# Patient Record
Sex: Female | Born: 1965 | Race: White | Hispanic: No | Marital: Married | Smoking: Never smoker
Health system: Southern US, Community
[De-identification: ages and names within clinical notes are randomized; demographics above are authoritative.]

## PROBLEM LIST (undated history)

## (undated) DIAGNOSIS — F32A Depression, unspecified: Secondary | ICD-10-CM

## (undated) DIAGNOSIS — I10 Essential (primary) hypertension: Secondary | ICD-10-CM

## (undated) DIAGNOSIS — F329 Major depressive disorder, single episode, unspecified: Secondary | ICD-10-CM

## (undated) DIAGNOSIS — E119 Type 2 diabetes mellitus without complications: Secondary | ICD-10-CM

## (undated) DIAGNOSIS — B019 Varicella without complication: Secondary | ICD-10-CM

## (undated) DIAGNOSIS — T7840XA Allergy, unspecified, initial encounter: Secondary | ICD-10-CM

## (undated) HISTORY — DX: Varicella without complication: B01.9

## (undated) HISTORY — DX: Depression, unspecified: F32.A

## (undated) HISTORY — PX: TUBAL LIGATION: SHX77

## (undated) HISTORY — DX: Allergy, unspecified, initial encounter: T78.40XA

## (undated) HISTORY — DX: Major depressive disorder, single episode, unspecified: F32.9

---

## 2006-06-08 ENCOUNTER — Ambulatory Visit: Payer: Self-pay | Admitting: Obstetrics and Gynecology

## 2011-06-14 ENCOUNTER — Ambulatory Visit: Payer: Self-pay | Admitting: Obstetrics and Gynecology

## 2011-12-28 ENCOUNTER — Ambulatory Visit: Payer: BC Managed Care – PPO

## 2011-12-28 ENCOUNTER — Ambulatory Visit (INDEPENDENT_AMBULATORY_CARE_PROVIDER_SITE_OTHER): Payer: BC Managed Care – PPO | Admitting: Internal Medicine

## 2011-12-28 VITALS — BP 140/80 | HR 80 | Temp 98.6°F | Resp 18 | Ht 64.0 in | Wt 189.0 lb

## 2011-12-28 DIAGNOSIS — M545 Low back pain: Secondary | ICD-10-CM

## 2011-12-28 DIAGNOSIS — T148XXA Other injury of unspecified body region, initial encounter: Secondary | ICD-10-CM

## 2011-12-28 MED ORDER — HYDROCODONE-ACETAMINOPHEN 5-325 MG PO TABS: 1.0000 | ORAL_TABLET | Freq: Four times a day (QID) | ORAL | Status: DC | PRN

## 2011-12-28 MED ORDER — CYCLOBENZAPRINE HCL 10 MG PO TABS: 10.0000 mg | ORAL_TABLET | Freq: Three times a day (TID) | ORAL | Status: DC | PRN

## 2011-12-28 MED ORDER — PREDNISONE 50 MG PO TABS: ORAL_TABLET | ORAL | Status: DC

## 2011-12-28 MED ORDER — KETOROLAC TROMETHAMINE 60 MG/2ML IM SOLN
60.0000 mg | Freq: Once | INTRAMUSCULAR | Status: AC
  Administered 2011-12-28: 60 mg via INTRAMUSCULAR

## 2011-12-28 NOTE — Patient Instructions (Signed)
vicdoin 1 tab every 4 hours as needed for pain. Flexeril 1 tab every 8 hours as needed for spasm. prednisone 1 tab daily for 3 days in am with food. If your symptoms worsen return to the office.

## 2011-12-28 NOTE — Progress Notes (Signed)
  Subjective:    Patient ID: Breanna Day, female    DOB: 1965-04-12, 46 y.o.   MRN: [id number]  HPI    Review of Systems     Objective:   Physical Exam    [facility] reading (PRIMARY) by  Dr. Mindi Junker lumbar spine negitive.     Assessment & Plan:

## 2011-12-28 NOTE — Progress Notes (Signed)
  Subjective:    Patient ID: Breanna Day, female    DOB: [birth year], 46 y.o.   MRN: [id number]  HPI  Bac pain Onset this am bending over Pain 8/10 No radiation down leg but is increased on the left side and in the middle No numbness no tingling no difficulty with bowel or bladder function  Review of Systems  Constitutional: Negative.   HENT: Negative.   Eyes: Negative.   Respiratory: Negative.   Cardiovascular: Negative.   Gastrointestinal: Negative.   Genitourinary: Negative.   Musculoskeletal: Positive for back pain.  Skin: Negative.   Neurological: Negative.   Hematological: Negative.   Psychiatric/Behavioral: Negative.   All other systems reviewed and are negative.       Objective:   Physical Exam  Constitutional: She is oriented to person, place, and time. She appears well-developed and well-nourished.  HENT:  Head: Normocephalic.  Nose: Nose normal.  Mouth/Throat: Oropharynx is clear and moist.  Eyes: Conjunctivae normal and EOM are normal. Pupils are equal, round, and reactive to light.  Neck: Neck supple.  Cardiovascular: Normal rate, regular rhythm and normal heart sounds.   Pulmonary/Chest: Effort normal and breath sounds normal.  Abdominal: Soft. Bowel sounds are normal.  Musculoskeletal: She exhibits tenderness.       tender lumbar spine pain increased on the left of l3/ l5 region with muscle spasm of the area  Neurological: She is alert and oriented to person, place, and time. She has normal reflexes.  Skin: Skin is warm and dry.  Psychiatric: She has a normal mood and affect. Her behavior is normal. Judgment and thought content normal.     '[facility] reading (PRIMARY) by  Dr. Mindi Junker no fracture of dislocation. toradol im in the office for pain    Assessment & Plan:  Lumbago  Muscle strain  plan xray and also toradol im for pain in the office. D/c with antiinflammatory analgesics nd antispasmodics.

## 2012-06-27 ENCOUNTER — Ambulatory Visit: Payer: Self-pay | Admitting: Obstetrics and Gynecology

## 2013-07-04 ENCOUNTER — Ambulatory Visit: Payer: Self-pay | Admitting: Obstetrics and Gynecology

## 2013-07-04 LAB — HM MAMMOGRAPHY: HM Mammogram: NORMAL

## 2014-06-24 ENCOUNTER — Encounter: Payer: Self-pay | Admitting: Internal Medicine

## 2014-06-24 ENCOUNTER — Encounter (INDEPENDENT_AMBULATORY_CARE_PROVIDER_SITE_OTHER): Payer: Self-pay

## 2014-06-24 ENCOUNTER — Ambulatory Visit (INDEPENDENT_AMBULATORY_CARE_PROVIDER_SITE_OTHER): Payer: BLUE CROSS/BLUE SHIELD | Admitting: Internal Medicine

## 2014-06-24 VITALS — BP 130/86 | HR 95 | Temp 98.4°F | Ht 62.5 in | Wt 206.0 lb

## 2014-06-24 DIAGNOSIS — K219 Gastro-esophageal reflux disease without esophagitis: Secondary | ICD-10-CM | POA: Diagnosis not present

## 2014-06-24 DIAGNOSIS — J302 Other seasonal allergic rhinitis: Secondary | ICD-10-CM

## 2014-06-24 DIAGNOSIS — F32A Depression, unspecified: Secondary | ICD-10-CM

## 2014-06-24 DIAGNOSIS — F411 Generalized anxiety disorder: Secondary | ICD-10-CM | POA: Insufficient documentation

## 2014-06-24 DIAGNOSIS — L509 Urticaria, unspecified: Secondary | ICD-10-CM | POA: Insufficient documentation

## 2014-06-24 DIAGNOSIS — F329 Major depressive disorder, single episode, unspecified: Secondary | ICD-10-CM

## 2014-06-24 DIAGNOSIS — F419 Anxiety disorder, unspecified: Secondary | ICD-10-CM | POA: Insufficient documentation

## 2014-06-24 DIAGNOSIS — E669 Obesity, unspecified: Secondary | ICD-10-CM | POA: Insufficient documentation

## 2014-06-24 MED ORDER — HYDROXYZINE PAMOATE 25 MG PO CAPS: 25.0000 mg | ORAL_CAPSULE | Freq: Four times a day (QID) | ORAL | Status: DC | PRN

## 2014-06-24 MED ORDER — PANTOPRAZOLE SODIUM 40 MG PO TBEC: 40.0000 mg | DELAYED_RELEASE_TABLET | Freq: Every day | ORAL | Status: DC

## 2014-06-24 NOTE — Progress Notes (Signed)
Pre visit review using our clinic review tool, if applicable. No additional management support is needed unless otherwise documented below in the visit note. 

## 2014-06-24 NOTE — Progress Notes (Signed)
HPI  Pt presents to the clinic today to establish care and for management of the conditions listed below. She is transferring care from Dr. Beckey Downing at [facility] in [place].  Seasonal Allergies: she will take Claritin or Zyrtec if it gets really bad. Worse in the spring.  Depression: History of but not recurrent. Was treated for this 15 years ago. She denies anxiety, SI/HI.  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 She takes Hydroxyzine twice daily.  GERD: She denies breakthrough symptoms on Protonix.  Flu: 2014 Tetanus: she thinks < 10 years LMP: 06/20/14, ver yirregular Pap Smear: 07/2013 Mammogram: 07/2013, she will schedule 07/2014 Vision Screening: > 1 years ago Dentist: biannually  Past Medical History  Diagnosis Date  . Chicken pox   . Allergy   . Depression     Current Outpatient Prescriptions  Medication Sig Dispense Refill  . hydrOXYzine (VISTARIL) 25 MG capsule Take 25 mg by mouth every 6 (six) hours as needed.    . pantoprazole (PROTONIX) 40 MG tablet Take 40 mg by mouth daily.     No current facility-administered medications for this visit.    No Known Allergies  Family History  Problem Relation Age of Onset  . Hypertension Mother   . Arthritis Mother   . Hyperlipidemia Mother   . Hypertension Father   . Arthritis Father   . Hyperlipidemia Father   . Diabetes Maternal Grandmother   . Heart disease Maternal Grandmother   . Arthritis Maternal Grandmother   . Hypertension Maternal Grandmother   . Cancer Maternal Grandfather   . Heart disease Maternal Grandfather   . Arthritis Maternal Grandfather   . Hypertension Maternal Grandfather   . Diabetes Paternal Grandmother   . Cancer Paternal Grandmother   . Arthritis Paternal Grandmother   . Hypertension Paternal Grandmother   . Diabetes Paternal Grandfather   . Heart disease Paternal Grandfather   . Arthritis Paternal Grandfather   . Hypertension Paternal Grandfather   .  Cancer Maternal Uncle     Colon  . Cancer Paternal Aunt     breast    History   Social History  . Marital Status: Married    Spouse Name: N/A  . Number of Children: N/A  . Years of Education: N/A   Occupational History  . Not on file.   Social History Main Topics  . Smoking status: Never Smoker   . Smokeless tobacco: Never Used  . Alcohol Use: No  . Drug Use: No  . Sexual Activity: Not on file   Other Topics Concern  . Not on file   Social History Narrative    ROS:  Constitutional: Denies fever, malaise, fatigue, headache or abrupt weight changes.  Respiratory: Denies difficulty breathing, shortness of breath, cough or sputum production.   Cardiovascular: Denies chest pain, chest tightness, palpitations or swelling in the hands or feet.  Gastrointestinal: Denies abdominal pain, bloating, constipation, diarrhea or blood in the stool.  Skin: Denies redness, rashes, lesions or ulcercations.  Neurological: Denies dizziness, difficulty with memory, difficulty with speech or problems with balance and coordination.  Psych: Denies depression, anxiety, SI/HI.  No other specific complaints in a complete review of systems (except as listed in HPI above).  PE:  BP 130/86 mmHg  Pulse 95  Temp(Src) 98.4 F (36.9 C) (Oral)  Ht 5' 2.5" (1.588 m)  Wt 206 lb (93.441 kg)  BMI 37.05 kg/m2  SpO2 98%  LMP 06/20/2014  Wt Readings from Last 3  Encounters:  06/24/14 206 lb (93.441 kg)  12/28/11 189 lb (85.73 kg)    General: Appears her stated age, obese in NAD. HEENT: Eyes normal. Nose pink and moist. Throat pink and moist, no exudate or lesion noted. Skin: Warm, dry and intact Cardiovascular: Normal rate and rhythm. S1,S2 noted.  No murmur, rubs or gallops noted.  Pulmonary/Chest: Normal effort and positive vesicular breath sounds. No respiratory distress. No wheezes, rales or ronchi noted.  Abdomen: Soft and nontender. Normal bowel sounds, no bruits noted. No distention or  masses noted. Liver, spleen and kidneys non palpable. Neurological: Alert and oriented. Marland Kitchen. Psychiatric: Mood and affect normal. Behavior is normal. Judgment and thought content normal.     Assessment and Plan:

## 2014-06-24 NOTE — Assessment & Plan Note (Signed)
Encouraged her to work on diet and exercise 

## 2014-06-24 NOTE — Assessment & Plan Note (Signed)
Idiopathic  Continue Hydroxyzine twice daily

## 2014-06-24 NOTE — Assessment & Plan Note (Signed)
No reoccurance Will continue to monitor 

## 2014-06-24 NOTE — Assessment & Plan Note (Signed)
Continue OTC antihistamine prn

## 2014-06-24 NOTE — Assessment & Plan Note (Signed)
Continue Protonix daily Handout given on diet information for GERD

## 2014-06-24 NOTE — Patient Instructions (Signed)

## 2014-07-16 ENCOUNTER — Ambulatory Visit (INDEPENDENT_AMBULATORY_CARE_PROVIDER_SITE_OTHER): Payer: BLUE CROSS/BLUE SHIELD | Admitting: Internal Medicine

## 2014-07-16 ENCOUNTER — Encounter: Payer: Self-pay | Admitting: Internal Medicine

## 2014-07-16 VITALS — BP 126/78 | HR 87 | Temp 98.7°F | Wt 205.5 lb

## 2014-07-16 DIAGNOSIS — J0100 Acute maxillary sinusitis, unspecified: Secondary | ICD-10-CM

## 2014-07-16 MED ORDER — AMOXICILLIN-POT CLAVULANATE 875-125 MG PO TABS: 1.0000 | ORAL_TABLET | Freq: Two times a day (BID) | ORAL | Status: DC

## 2014-07-16 MED ORDER — HYDROXYZINE HCL 25 MG PO TABS: 25.0000 mg | ORAL_TABLET | Freq: Two times a day (BID) | ORAL | Status: DC

## 2014-07-16 NOTE — Progress Notes (Signed)
HPI  Pt presents to the clinic today with c/o nasal congestion, post nasal drip and cough. This started 2-3 weeks ago. The cough is productive of green mucous. She is blowing green mucous out of her nose. She denies fever, chills or body aches. She has taken Mucinex and cough syrup with some relief. She has had allergies intermittently in the past. She takes Claritin OTC some times. She has had sick contacts. She does not smoke.  Review of Systems      Past Medical History  Diagnosis Date  . Chicken pox   . Allergy   . Depression     Family History  Problem Relation Age of Onset  . Hypertension Mother   . Arthritis Mother   . Hyperlipidemia Mother   . Hypertension Father   . Arthritis Father   . Hyperlipidemia Father   . Diabetes Maternal Grandmother   . Heart disease Maternal Grandmother   . Arthritis Maternal Grandmother   . Hypertension Maternal Grandmother   . Cancer Maternal Grandfather   . Heart disease Maternal Grandfather   . Arthritis Maternal Grandfather   . Hypertension Maternal Grandfather   . Diabetes Paternal Grandmother   . Cancer Paternal Grandmother   . Arthritis Paternal Grandmother   . Hypertension Paternal Grandmother   . Diabetes Paternal Grandfather   . Heart disease Paternal Grandfather   . Arthritis Paternal Grandfather   . Hypertension Paternal Grandfather   . Cancer Maternal Uncle     Colon  . Cancer Paternal Aunt     breast    History   Social History  . Marital Status: Married    Spouse Name: N/A  . Number of Children: N/A  . Years of Education: N/A   Occupational History  . Not on file.   Social History Main Topics  . Smoking status: Never Smoker   . Smokeless tobacco: Never Used  . Alcohol Use: No  . Drug Use: No  . Sexual Activity: Yes   Other Topics Concern  . Not on file   Social History Narrative    No Known Allergies   Constitutional: Positive headache, fatigue. Denies fever or abrupt weight changes.  HEENT:   Positive facial pressure, nasal congestion and sore throat. Denies eye redness, eye pain, pressure behind the eyes, facial pain, nasal congestion, ear pain, ringing in the ears, wax buildup, runny nose or bloody nose. Respiratory: Positive cough. Denies difficulty breathing or shortness of breath.  Cardiovascular: Denies chest pain, chest tightness, palpitations or swelling in the hands or feet.   No other specific complaints in a complete review of systems (except as listed in HPI above).  Objective:   BP 126/78 mmHg  Pulse 87  Temp(Src) 98.7 F (37.1 C) (Oral)  Wt 205 lb 8 oz (93.214 kg)  SpO2 98%  LMP 06/20/2014 Wt Readings from Last 3 Encounters:  07/16/14 205 lb 8 oz (93.214 kg)  06/24/14 206 lb (93.441 kg)  12/28/11 189 lb (85.73 kg)     General: Appears her stated age, well developed, well nourished in NAD. HEENT: Head: normal shape and size, maxillary sinus tenderness noted; Eyes: sclera white, no icterus, conjunctiva pink; Ears: Tm's gray and intact, normal light reflex; Nose: mucosa boggy and moist, septum midline; Throat/Mouth: + PND. Teeth present, mucosa erythematous and moist, no exudate noted, no lesions or ulcerations noted.  Neck: No cervical lymphadenopathy.  Cardiovascular: Normal rate and rhythm. S1,S2 noted.  No murmur, rubs or gallops noted.  Pulmonary/Chest: Normal effort and  positive vesicular breath sounds. No respiratory distress. No wheezes, rales or ronchi noted.      Assessment & Plan:   Acute Maxillary Sinusitis:  Get some rest and drink plenty of water Can use a Neti Pot  Continue Claritin eRx for Augmentin BID x 10 days Ibuprofen as needed for facial pain and pressure  RTC as needed or if symptoms persist or worsen

## 2014-07-16 NOTE — Patient Instructions (Signed)

## 2014-08-22 ENCOUNTER — Telehealth: Payer: Self-pay | Admitting: Internal Medicine

## 2014-08-22 NOTE — Telephone Encounter (Signed)
Medical records received from [facility], no immunizations on file. Mammogram will be abstracted and scanned

## 2014-08-29 ENCOUNTER — Encounter: Payer: Self-pay | Admitting: Internal Medicine

## 2014-10-23 ENCOUNTER — Other Ambulatory Visit: Payer: Self-pay | Admitting: Obstetrics and Gynecology

## 2014-10-23 DIAGNOSIS — Z1231 Encounter for screening mammogram for malignant neoplasm of breast: Secondary | ICD-10-CM

## 2014-10-23 LAB — HM PAP SMEAR: HM PAP: NEGATIVE

## 2014-10-23 LAB — FECAL OCCULT BLOOD, GUAIAC: FECAL OCCULT BLD: NEGATIVE

## 2014-10-28 ENCOUNTER — Ambulatory Visit
Admission: RE | Admit: 2014-10-28 | Discharge: 2014-10-28 | Disposition: A | Discharge status: Home / Self Care | Payer: BLUE CROSS/BLUE SHIELD | Source: Ambulatory Visit | Attending: Obstetrics and Gynecology | Admitting: Obstetrics and Gynecology

## 2014-10-28 DIAGNOSIS — Z1231 Encounter for screening mammogram for malignant neoplasm of breast: Secondary | ICD-10-CM | POA: Diagnosis present

## 2015-01-02 ENCOUNTER — Other Ambulatory Visit: Payer: Self-pay

## 2015-01-02 DIAGNOSIS — K219 Gastro-esophageal reflux disease without esophagitis: Secondary | ICD-10-CM

## 2015-01-02 MED ORDER — PANTOPRAZOLE SODIUM 40 MG PO TBEC: 40.0000 mg | DELAYED_RELEASE_TABLET | Freq: Every day | ORAL | Status: DC

## 2015-02-25 ENCOUNTER — Other Ambulatory Visit: Payer: Self-pay | Admitting: Internal Medicine

## 2015-03-24 ENCOUNTER — Other Ambulatory Visit: Payer: Self-pay | Admitting: Internal Medicine

## 2015-04-20 ENCOUNTER — Other Ambulatory Visit: Payer: Self-pay | Admitting: Internal Medicine

## 2015-05-15 ENCOUNTER — Encounter: Payer: Self-pay | Admitting: Internal Medicine

## 2015-05-15 ENCOUNTER — Ambulatory Visit (INDEPENDENT_AMBULATORY_CARE_PROVIDER_SITE_OTHER): Payer: BC Managed Care – PPO | Admitting: Internal Medicine

## 2015-05-15 VITALS — BP 120/82 | HR 88 | Temp 98.1°F | Wt 201.0 lb

## 2015-05-15 DIAGNOSIS — K219 Gastro-esophageal reflux disease without esophagitis: Secondary | ICD-10-CM | POA: Diagnosis not present

## 2015-05-15 DIAGNOSIS — F329 Major depressive disorder, single episode, unspecified: Secondary | ICD-10-CM

## 2015-05-15 DIAGNOSIS — J302 Other seasonal allergic rhinitis: Secondary | ICD-10-CM | POA: Diagnosis not present

## 2015-05-15 DIAGNOSIS — F32A Depression, unspecified: Secondary | ICD-10-CM

## 2015-05-15 DIAGNOSIS — L509 Urticaria, unspecified: Secondary | ICD-10-CM

## 2015-05-15 DIAGNOSIS — E669 Obesity, unspecified: Secondary | ICD-10-CM

## 2015-05-15 MED ORDER — HYDROXYZINE HCL 25 MG PO TABS: 25.0000 mg | ORAL_TABLET | Freq: Two times a day (BID) | ORAL | Status: DC

## 2015-05-15 MED ORDER — PANTOPRAZOLE SODIUM 40 MG PO TBEC: 40.0000 mg | DELAYED_RELEASE_TABLET | Freq: Two times a day (BID) | ORAL | Status: DC

## 2015-05-15 NOTE — Progress Notes (Signed)
HPI  Pt presents to the clinic today for follow up of chronic conditions.  Seasonal Allergies: Worse in the spring. She will take Claritin or Zyrtec if it gets really bad.   Depression: History of but not recurrent. Was treated for this 15 years ago. She denies anxiety, SI/HI.  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 She takes Hydroxyzine twice daily.  GERD: She reports she ran out of her Protonix about 3 weeks ago. Her reflux has been really bad since that time. She has been taking Tums or Maalox but reports it does not touch it.    Past Medical History  Diagnosis Date  . Chicken pox   . Allergy   . Depression     Current Outpatient Prescriptions  Medication Sig Dispense Refill  . hydrOXYzine (ATARAX/VISTARIL) 25 MG tablet Take 1 tablet (25 mg total) by mouth 2 (two) times daily. MUST SCHEDULE ANNUAL PHYSICAL [phone] 60 tablet 1  . pantoprazole (PROTONIX) 40 MG tablet Take 1 tablet (40 mg total) by mouth daily. MUST SCHEDULE ANNUAL PHYSICAL FOR MORE REFILLS 30 tablet 1   No current facility-administered medications for this visit.    No Known Allergies  Family History  Problem Relation Age of Onset  . Hypertension Mother   . Arthritis Mother   . Hyperlipidemia Mother   . Hypertension Father   . Arthritis Father   . Hyperlipidemia Father   . Diabetes Maternal Grandmother   . Heart disease Maternal Grandmother   . Arthritis Maternal Grandmother   . Hypertension Maternal Grandmother   . Cancer Maternal Grandfather   . Heart disease Maternal Grandfather   . Arthritis Maternal Grandfather   . Hypertension Maternal Grandfather   . Diabetes Paternal Grandmother   . Cancer Paternal Grandmother   . Arthritis Paternal Grandmother   . Hypertension Paternal Grandmother   . Diabetes Paternal Grandfather   . Heart disease Paternal Grandfather   . Arthritis Paternal Grandfather   . Hypertension Paternal Grandfather   . Cancer Maternal Uncle      Colon  . Breast cancer Paternal Aunt 38    Social History   Social History  . Marital Status: Married    Spouse Name: N/A  . Number of Children: N/A  . Years of Education: N/A   Occupational History  . Not on file.   Social History Main Topics  . Smoking status: Never Smoker   . Smokeless tobacco: Never Used  . Alcohol Use: No  . Drug Use: No  . Sexual Activity: Yes   Other Topics Concern  . Not on file   Social History Narrative    ROS:  Constitutional: Denies fever, malaise, fatigue, headache or abrupt weight changes.  Respiratory: Denies difficulty breathing, shortness of breath, cough or sputum production.   Cardiovascular: Denies chest pain, chest tightness, palpitations or swelling in the hands or feet.  Gastrointestinal: Denies abdominal pain, bloating, constipation, diarrhea or blood in the stool.  Skin: Denies redness, rashes, lesions or ulcercations.  Neurological: Denies dizziness, difficulty with memory, difficulty with speech or problems with balance and coordination.  Psych: Denies depression, anxiety, SI/HI.  No other specific complaints in a complete review of systems (except as listed in HPI above).  PE:  BP 120/82 mmHg  Pulse 88  Temp(Src) 98.1 F (36.7 C) (Oral)  Wt 201 lb (91.173 kg)  SpO2 98%   Wt Readings from Last 3 Encounters:  05/15/15 201 lb (91.173 kg)  07/16/14 205 lb 8 oz (  93.214 kg)  06/24/14 206 lb (93.441 kg)    General: Appears her stated age, obese in NAD. HEENT: Eyes normal. Nose pink and moist. Throat pink and moist, no exudate or lesion noted. Skin: Warm, dry and intact Cardiovascular: Normal rate and rhythm. S1,S2 noted.  No murmur, rubs or gallops noted.  Pulmonary/Chest: Normal effort and positive vesicular breath sounds. No respiratory distress. No wheezes, rales or ronchi noted.  Abdomen: Soft and nontender. Normal bowel sounds. No distention or masses noted. Liver, spleen and kidneys non  palpable. Neurological: Alert and oriented. Marland Kitchen. Psychiatric: Mood and affect normal. Behavior is normal. Judgment and thought content normal.     Assessment and Plan:

## 2015-05-15 NOTE — Progress Notes (Signed)
Pre visit review using our clinic review tool, if applicable. No additional management support is needed unless otherwise documented below in the visit note. 

## 2015-05-16 ENCOUNTER — Encounter: Payer: Self-pay | Admitting: Internal Medicine

## 2015-05-16 NOTE — Assessment & Plan Note (Signed)
Continue Claritin or Zyrtec as needed

## 2015-05-16 NOTE — Assessment & Plan Note (Signed)
Restart Protonix 40 mg BID x 2 weeks then try to go back to daily Discussed how weight loss could help improve her reflux

## 2015-05-16 NOTE — Assessment & Plan Note (Signed)
Encouraged her to work on diet and exercise 

## 2015-05-16 NOTE — Assessment & Plan Note (Signed)
Continue Hydroxyzine Refilled today

## 2015-05-16 NOTE — Assessment & Plan Note (Signed)
Stable off meds Will continue to monitor 

## 2015-05-16 NOTE — Patient Instructions (Signed)
Food Choices for Gastroesophageal Reflux Disease, Adult When you have gastroesophageal reflux disease (GERD), the foods you eat and your eating habits are very important. Choosing the right foods can help ease the discomfort of GERD. WHAT GENERAL GUIDELINES DO I NEED TO FOLLOW?  Choose fruits, vegetables, whole grains, low-fat dairy products, and low-fat meat, fish, and poultry.  Limit fats such as oils, salad dressings, butter, nuts, and avocado.  Keep a food diary to identify foods that cause symptoms.  Avoid foods that cause reflux. These may be different for different people.  Eat frequent small meals instead of three large meals each day.  Eat your meals slowly, in a relaxed setting.  Limit fried foods.  Cook foods using methods other than frying.  Avoid drinking alcohol.  Avoid drinking large amounts of liquids with your meals.  Avoid bending over or lying down until 2-3 hours after eating. WHAT FOODS ARE NOT RECOMMENDED? The following are some foods and drinks that may worsen your symptoms: Vegetables Tomatoes. Tomato juice. Tomato and spaghetti sauce. Chili peppers. Onion and garlic. Horseradish. Fruits Oranges, grapefruit, and lemon (fruit and juice). Meats High-fat meats, fish, and poultry. This includes hot dogs, ribs, ham, sausage, salami, and bacon. Dairy Whole milk and chocolate milk. Sour cream. Cream. Butter. Ice cream. Cream cheese.  Beverages Coffee and tea, with or without caffeine. Carbonated beverages or energy drinks. Condiments Hot sauce. Barbecue sauce.  Sweets/Desserts Chocolate and cocoa. Donuts. Peppermint and spearmint. Fats and Oils High-fat foods, including French fries and potato chips. Other Vinegar. Strong spices, such as black pepper, white pepper, red pepper, cayenne, curry powder, cloves, ginger, and chili powder. The items listed above may not be a complete list of foods and beverages to avoid. Contact your dietitian for more  information.   This information is not intended to replace advice given to you by your health care provider. Make sure you discuss any questions you have with your health care provider.   Document Released: 02/28/2005 Document Revised: 03/21/2014 Document Reviewed: 01/02/2013 Elsevier Interactive Patient Education 2016 [employer]  

## 2015-05-21 ENCOUNTER — Encounter: Payer: Self-pay | Admitting: Internal Medicine

## 2015-05-21 ENCOUNTER — Ambulatory Visit (INDEPENDENT_AMBULATORY_CARE_PROVIDER_SITE_OTHER): Payer: BC Managed Care – PPO | Admitting: Internal Medicine

## 2015-05-21 VITALS — BP 122/80 | HR 71 | Temp 98.1°F | Ht 62.5 in | Wt 199.5 lb

## 2015-05-21 DIAGNOSIS — Z0000 Encounter for general adult medical examination without abnormal findings: Secondary | ICD-10-CM | POA: Diagnosis not present

## 2015-05-21 NOTE — Progress Notes (Signed)
Subjective:    Patient ID: Breanna Day, female    DOB: 01/30/1966, 50 y.o.   MRN: [id number]  HPI  Pt presents to the clinic today for her annual exam.  Flu: 12/2013 Tetanus: < 10 years ago Pap Smear: 10/2014 Mammogram: 10/2014 Vision Screening: as needed Dentist: biannually  Diet: She does consume meat. She eats more veggies than fruit. She tries to avoid fried food. She drinks mostly water. Exercise: She walks for about 45-50 minutes daily.  Review of Systems      Past Medical History  Diagnosis Date  . Chicken pox   . Allergy   . Depression     Current Outpatient Prescriptions  Medication Sig Dispense Refill  . hydrOXYzine (ATARAX/VISTARIL) 25 MG tablet Take 1 tablet (25 mg total) by mouth 2 (two) times daily. 60 tablet 5  . pantoprazole (PROTONIX) 40 MG tablet Take 1 tablet (40 mg total) by mouth 2 (two) times daily before a meal. 60 tablet 5   No current facility-administered medications for this visit.    No Known Allergies  Family History  Problem Relation Age of Onset  . Hypertension Mother   . Arthritis Mother   . Hyperlipidemia Mother   . Hypertension Father   . Arthritis Father   . Hyperlipidemia Father   . Diabetes Maternal Grandmother   . Heart disease Maternal Grandmother   . Arthritis Maternal Grandmother   . Hypertension Maternal Grandmother   . Cancer Maternal Grandfather   . Heart disease Maternal Grandfather   . Arthritis Maternal Grandfather   . Hypertension Maternal Grandfather   . Diabetes Paternal Grandmother   . Cancer Paternal Grandmother   . Arthritis Paternal Grandmother   . Hypertension Paternal Grandmother   . Diabetes Paternal Grandfather   . Heart disease Paternal Grandfather   . Arthritis Paternal Grandfather   . Hypertension Paternal Grandfather   . Cancer Maternal Uncle     Colon  . Breast cancer Paternal Aunt [id number]    Social History   Social History  . Marital Status: Married    Spouse Name: N/A  . Number of  Children: N/A  . Years of Education: N/A   Occupational History  . Not on file.   Social History Main Topics  . Smoking status: Never Smoker   . Smokeless tobacco: Never Used  . Alcohol Use: No  . Drug Use: No  . Sexual Activity: Yes   Other Topics Concern  . Not on file   Social History Narrative     Constitutional: Denies fever, malaise, fatigue, headache or abrupt weight changes.  HEENT: Denies eye pain, eye redness, ear pain, ringing in the ears, wax buildup, runny nose, nasal congestion, bloody nose, or sore throat. Respiratory: Denies difficulty breathing, shortness of breath, cough or sputum production.   Cardiovascular: Denies chest pain, chest tightness, palpitations or swelling in the hands or feet.  Gastrointestinal: Denies abdominal pain, bloating, constipation, diarrhea or blood in the stool.  GU: Denies urgency, frequency, pain with urination, burning sensation, blood in urine, odor or discharge. Musculoskeletal: Denies decrease in range of motion, difficulty with gait, muscle pain or joint pain and swelling.  Skin: Denies redness, rashes, lesions or ulcercations.  Neurological: Denies dizziness, difficulty with memory, difficulty with speech or problems with balance and coordination.  Psych: Denies anxiety, depression, SI/HI.  No other specific complaints in a complete review of systems (except as listed in HPI above).  Objective:   Physical Exam  BP 122/80 mmHg  Pulse 71  Temp(Src) 98.1 F (36.7 C) (Oral)  Ht 5' 2.5" (1.588 m)  Wt 199 lb 8 oz (90.493 kg)  BMI 35.89 kg/m2  SpO2 98%  LMP 05/16/2015 Wt Readings from Last 3 Encounters:  05/21/15 199 lb 8 oz (90.493 kg)  05/15/15 201 lb (91.173 kg)  07/16/14 205 lb 8 oz (93.214 kg)    General: Appears her stated age, obese in NAD. Skin: Warm, dry and intact. No rashes, lesions or ulcerations noted. HEENT: Head: normal shape and size; Eyes: sclera white, no icterus, conjunctiva pink, PERRLA and EOMs  intact; Ears: Tm's gray and intact, normal light reflex; Throat/Mouth: Teeth present, mucosa pink and moist, no exudate, lesions or ulcerations noted.  Neck:  Neck supple, trachea midline. No masses, lumps or thyromegaly present.  Cardiovascular: Normal rate and rhythm. S1,S2 noted.  No murmur, rubs or gallops noted. No JVD or BLE edema. No carotid bruits noted. Pulmonary/Chest: Normal effort and positive vesicular breath sounds. No respiratory distress. No wheezes, rales or ronchi noted.  Abdomen: Soft and nontender. Normal bowel sounds. No distention or masses noted. Liver, spleen and kidneys non palpable. Musculoskeletal: Strength 5/5 BUE/BLE. No signs of joint swelling. No difficulty with gait.  Neurological: Alert and oriented. Cranial nerves II-XII grossly intact. Coordination normal.  Psychiatric: Mood and affect normal. Behavior is normal. Judgment and thought content normal.         Assessment & Plan:   Preventative Health Maintenance:  She declines flu shot today Tetanus UTD Pap and Mammogram UTD Encouraged her to see an eye doctor and dentist at least annually Encouraged her to consume a healthy diet and exercise regimen Will get labs from GYN from 8/ 2016  RTC in 1 year for follow up/annual exam

## 2015-05-21 NOTE — Progress Notes (Signed)
Pre visit review using our clinic review tool, if applicable. No additional management support is needed unless otherwise documented below in the visit note. 

## 2015-05-21 NOTE — Patient Instructions (Signed)
Health Maintenance, Female Adopting a healthy lifestyle and getting preventive care can go a long way to promote health and wellness. Talk with your health care provider about what schedule of regular examinations is right for you. This is a good chance for you to check in with your provider about disease prevention and staying healthy. In between checkups, there are plenty of things you can do on your own. Experts have done a lot of research about which lifestyle changes and preventive measures are most likely to keep you healthy. Ask your health care provider for more information. WEIGHT AND DIET  Eat a healthy diet  Be sure to include plenty of vegetables, fruits, low-fat dairy products, and lean protein.  Do not eat a lot of foods high in solid fats, added sugars, or salt.  Get regular exercise. This is one of the most important things you can do for your health.  Most adults should exercise for at least 150 minutes each week. The exercise should increase your heart rate and make you sweat (moderate-intensity exercise).  Most adults should also do strengthening exercises at least twice a week. This is in addition to the moderate-intensity exercise.  Maintain a healthy weight  Body mass index (BMI) is a measurement that can be used to identify possible weight problems. It estimates body fat based on height and weight. Your health care provider can help determine your BMI and help you achieve or maintain a healthy weight.  For females 20 years of age and older:   A BMI below 18.5 is considered underweight.  A BMI of 18.5 to 24.9 is normal.  A BMI of 25 to 29.9 is considered overweight.  A BMI of 30 and above is considered obese.  Watch levels of cholesterol and blood lipids  You should start having your blood tested for lipids and cholesterol at 50 years of age, then have this test every 5 years.  You may need to have your cholesterol levels checked more often if:  Your lipid  or cholesterol levels are high.  You are older than 50 years of age.  You are at high risk for heart disease.  CANCER SCREENING   Lung Cancer  Lung cancer screening is recommended for adults 55-80 years old who are at high risk for lung cancer because of a history of smoking.  A yearly low-dose CT scan of the lungs is recommended for people who:  Currently smoke.  Have quit within the past 15 years.  Have at least a 30-pack-year history of smoking. A pack year is smoking an average of one pack of cigarettes a day for 1 year.  Yearly screening should continue until it has been 15 years since you quit.  Yearly screening should stop if you develop a health problem that would prevent you from having lung cancer treatment.  Breast Cancer  Practice breast self-awareness. This means understanding how your breasts normally appear and feel.  It also means doing regular breast self-exams. Let your health care provider know about any changes, no matter how small.  If you are in your 20s or 30s, you should have a clinical breast exam (CBE) by a health care provider every 1-3 years as part of a regular health exam.  If you are 40 or older, have a CBE every year. Also consider having a breast X-ray (mammogram) every year.  If you have a family history of breast cancer, talk to your health care provider about genetic screening.  If you   are at high risk for breast cancer, talk to your health care provider about having an MRI and a mammogram every year.  Breast cancer gene (BRCA) assessment is recommended for women who have family members with BRCA-related cancers. BRCA-related cancers include:  Breast.  Ovarian.  Tubal.  Peritoneal cancers.  Results of the assessment will determine the need for genetic counseling and BRCA1 and BRCA2 testing. Cervical Cancer Your health care provider may recommend that you be screened regularly for cancer of the pelvic organs (ovaries, uterus, and  vagina). This screening involves a pelvic examination, including checking for microscopic changes to the surface of your cervix (Pap test). You may be encouraged to have this screening done every 3 years, beginning at age 21.  For women ages 30-65, health care providers may recommend pelvic exams and Pap testing every 3 years, or they may recommend the Pap and pelvic exam, combined with testing for human papilloma virus (HPV), every 5 years. Some types of HPV increase your risk of cervical cancer. Testing for HPV may also be done on women of any age with unclear Pap test results.  Other health care providers may not recommend any screening for nonpregnant women who are considered low risk for pelvic cancer and who do not have symptoms. Ask your health care provider if a screening pelvic exam is right for you.  If you have had past treatment for cervical cancer or a condition that could lead to cancer, you need Pap tests and screening for cancer for at least 20 years after your treatment. If Pap tests have been discontinued, your risk factors (such as having a new sexual partner) need to be reassessed to determine if screening should resume. Some women have medical problems that increase the chance of getting cervical cancer. In these cases, your health care provider may recommend more frequent screening and Pap tests. Colorectal Cancer  This type of cancer can be detected and often prevented.  Routine colorectal cancer screening usually begins at 50 years of age and continues through 50 years of age.  Your health care provider may recommend screening at an earlier age if you have risk factors for colon cancer.  Your health care provider may also recommend using home test kits to check for hidden blood in the stool.  A small camera at the end of a tube can be used to examine your colon directly (sigmoidoscopy or colonoscopy). This is done to check for the earliest forms of colorectal  cancer.  Routine screening usually begins at age 50.  Direct examination of the colon should be repeated every 5-10 years through 50 years of age. However, you may need to be screened more often if early forms of precancerous polyps or small growths are found. Skin Cancer  Check your skin from head to toe regularly.  Tell your health care provider about any new moles or changes in moles, especially if there is a change in a mole's shape or color.  Also tell your health care provider if you have a mole that is larger than the size of a pencil eraser.  Always use sunscreen. Apply sunscreen liberally and repeatedly throughout the day.  Protect yourself by wearing long sleeves, pants, a wide-brimmed hat, and sunglasses whenever you are outside. HEART DISEASE, DIABETES, AND HIGH BLOOD PRESSURE   High blood pressure causes heart disease and increases the risk of stroke. High blood pressure is more likely to develop in:  People who have blood pressure in the high end   of the normal range (130-139/85-89 mm Hg).  People who are overweight or obese.  People who are African American.  If you are 38-23 years of age, have your blood pressure checked every 3-5 years. If you are 61 years of age or older, have your blood pressure checked every year. You should have your blood pressure measured twice--once when you are at a hospital or clinic, and once when you are not at a hospital or clinic. Record the average of the two measurements. To check your blood pressure when you are not at a hospital or clinic, you can use:  An automated blood pressure machine at a pharmacy.  A home blood pressure monitor.  If you are between 45 years and 39 years old, ask your health care provider if you should take aspirin to prevent strokes.  Have regular diabetes screenings. This involves taking a blood sample to check your fasting blood sugar level.  If you are at a normal weight and have a low risk for diabetes,  have this test once every three years after 50 years of age.  If you are overweight and have a high risk for diabetes, consider being tested at a younger age or more often. PREVENTING INFECTION  Hepatitis B  If you have a higher risk for hepatitis B, you should be screened for this virus. You are considered at high risk for hepatitis B if:  You were born in a country where hepatitis B is common. Ask your health care provider which countries are considered high risk.  Your parents were born in a high-risk country, and you have not been immunized against hepatitis B (hepatitis B vaccine).  You have HIV or AIDS.  You use needles to inject street drugs.  You live with someone who has hepatitis B.  You have had sex with someone who has hepatitis B.  You get hemodialysis treatment.  You take certain medicines for conditions, including cancer, organ transplantation, and autoimmune conditions. Hepatitis C  Blood testing is recommended for:  Everyone born from [birth year] through 1965.  Anyone with known risk factors for hepatitis C. Sexually transmitted infections (STIs)  You should be screened for sexually transmitted infections (STIs) including gonorrhea and chlamydia if:  You are sexually active and are younger than 50 years of age.  You are older than 50 years of age and your health care provider tells you that you are at risk for this type of infection.  Your sexual activity has changed since you were last screened and you are at an increased risk for chlamydia or gonorrhea. Ask your health care provider if you are at risk.  If you do not have HIV, but are at risk, it may be recommended that you take a prescription medicine daily to prevent HIV infection. This is called pre-exposure prophylaxis (PrEP). You are considered at risk if:  You are sexually active and do not regularly use condoms or know the HIV status of your partner(s).  You take drugs by injection.  You are sexually  active with a partner who has HIV. Talk with your health care provider about whether you are at high risk of being infected with HIV. If you choose to begin PrEP, you should first be tested for HIV. You should then be tested every 3 months for as long as you are taking PrEP.  PREGNANCY   If you are premenopausal and you may become pregnant, ask your health care provider about preconception counseling.  If you may  become pregnant, take 400 to 800 micrograms (mcg) of folic acid every day.  If you want to prevent pregnancy, talk to your health care provider about birth control (contraception). OSTEOPOROSIS AND MENOPAUSE   Osteoporosis is a disease in which the bones lose minerals and strength with aging. This can result in serious bone fractures. Your risk for osteoporosis can be identified using a bone density scan.  If you are 61 years of age or older, or if you are at risk for osteoporosis and fractures, ask your health care provider if you should be screened.  Ask your health care provider whether you should take a calcium or vitamin D supplement to lower your risk for osteoporosis.  Menopause may have certain physical symptoms and risks.  Hormone replacement therapy may reduce some of these symptoms and risks. Talk to your health care provider about whether hormone replacement therapy is right for you.  HOME CARE INSTRUCTIONS   Schedule regular health, dental, and eye exams.  Stay current with your immunizations.   Do not use any tobacco products including cigarettes, chewing tobacco, or electronic cigarettes.  If you are pregnant, do not drink alcohol.  If you are breastfeeding, limit how much and how often you drink alcohol.  Limit alcohol intake to no more than 1 drink per day for nonpregnant women. One drink equals 12 ounces of beer, 5 ounces of wine, or 1 ounces of hard liquor.  Do not use street drugs.  Do not share needles.  Ask your health care provider for help if  you need support or information about quitting drugs.  Tell your health care provider if you often feel depressed.  Tell your health care provider if you have ever been abused or do not feel safe at home.   This information is not intended to replace advice given to you by your health care provider. Make sure you discuss any questions you have with your health care provider.   Document Released: 09/13/2010 Document Revised: 03/21/2014 Document Reviewed: 01/30/2013 Elsevier Interactive Patient Education [employer].

## 2015-08-21 ENCOUNTER — Encounter: Payer: Self-pay | Admitting: Internal Medicine

## 2015-08-21 ENCOUNTER — Ambulatory Visit (INDEPENDENT_AMBULATORY_CARE_PROVIDER_SITE_OTHER): Payer: BC Managed Care – PPO | Admitting: Internal Medicine

## 2015-08-21 VITALS — BP 124/80 | HR 92 | Temp 99.3°F | Wt 209.5 lb

## 2015-08-21 DIAGNOSIS — J020 Streptococcal pharyngitis: Secondary | ICD-10-CM

## 2015-08-21 LAB — POCT RAPID STREP A (OFFICE): Rapid Strep A Screen: POSITIVE — AB

## 2015-08-21 MED ORDER — AMOXICILLIN 500 MG PO CAPS: 500.0000 mg | ORAL_CAPSULE | Freq: Three times a day (TID) | ORAL | Status: DC

## 2015-08-21 NOTE — Progress Notes (Signed)
Pre visit review using our clinic review tool, if applicable. No additional management support is needed unless otherwise documented below in the visit note. 

## 2015-08-21 NOTE — Patient Instructions (Signed)

## 2015-08-21 NOTE — Progress Notes (Signed)
Subjective:    Patient ID: Breanna Day, female    DOB: 02/03/1966, 50 y.o.   MRN: [id number]  HPI   Pt presents to the clinic today with c/o a sore  Throat. This started 2 days ago. She has had some mild difficulty swallowing. She has run low grade fevers, but denies runny nose, ear pain or cough. She has tried Ibuprofen with minimal relief. She has not had sick contact.  Review of Systems      Past Medical History  Diagnosis Date  . Chicken pox   . Allergy   . Depression     Current Outpatient Prescriptions  Medication Sig Dispense Refill  . hydrOXYzine (ATARAX/VISTARIL) 25 MG tablet Take 1 tablet (25 mg total) by mouth 2 (two) times daily. 60 tablet 5  . pantoprazole (PROTONIX) 40 MG tablet Take 1 tablet (40 mg total) by mouth 2 (two) times daily before a meal. 60 tablet 5   No current facility-administered medications for this visit.    No Known Allergies  Family History  Problem Relation Age of Onset  . Hypertension Mother   . Arthritis Mother   . Hyperlipidemia Mother   . Hypertension Father   . Arthritis Father   . Hyperlipidemia Father   . Diabetes Maternal Grandmother   . Heart disease Maternal Grandmother   . Arthritis Maternal Grandmother   . Hypertension Maternal Grandmother   . Cancer Maternal Grandfather   . Heart disease Maternal Grandfather   . Arthritis Maternal Grandfather   . Hypertension Maternal Grandfather   . Diabetes Paternal Grandmother   . Cancer Paternal Grandmother   . Arthritis Paternal Grandmother   . Hypertension Paternal Grandmother   . Diabetes Paternal Grandfather   . Heart disease Paternal Grandfather   . Arthritis Paternal Grandfather   . Hypertension Paternal Grandfather   . Cancer Maternal Uncle     Colon  . Breast cancer Paternal Aunt [id number]    Social History   Social History  . Marital Status: Married    Spouse Name: N/A  . Number of Children: N/A  . Years of Education: N/A   Occupational History  . Not on file.     Social History Main Topics  . Smoking status: Never Smoker   . Smokeless tobacco: Never Used  . Alcohol Use: No  . Drug Use: No  . Sexual Activity: Yes   Other Topics Concern  . Not on file   Social History Narrative     Constitutional: Denies fever, malaise, fatigue, headache or abrupt weight changes.  HEENT: Pt reports sore throat. Denies eye pain, eye redness, ear pain, ringing in the ears, wax buildup, runny nose, nasal congestion, bloody nose. Respiratory: Denies difficulty breathing, shortness of breath, cough or sputum production.   Cardiovascular: Denies chest pain, chest tightness, palpitations or swelling in the hands or feet.   No other specific complaints in a complete review of systems (except as listed in HPI above).  Objective:   Physical Exam   BP 124/80 mmHg  Pulse 92  Temp(Src) 99.3 F (37.4 C) (Oral)  Wt 209 lb 8 oz (95.029 kg)  SpO2 98% Wt Readings from Last 3 Encounters:  08/21/15 209 lb 8 oz (95.029 kg)  05/21/15 199 lb 8 oz (90.493 kg)  05/15/15 201 lb (91.173 kg)    General: Appears her stated age, well developed, well nourished in NAD. HEENT: Head: normal shape and size; Eyes: sclera white, no icterus, conjunctiva pink, PERRLA and EOMs intact;  Throat/Mouth: Teeth present, mucosa pink and moist, no exudate, lesions or ulcerations noted.  Neck:  No adenopathy noted. Cardiovascular: Normal rate and rhythm. S1,S2 noted.  No murmur, rubs or gallops noted.  Pulmonary/Chest: Normal effort and positive vesicular breath sounds. No respiratory distress. No wheezes, rales or ronchi noted.     Assessment & Plan:   Sore throat:  RST: positive Continue Ibuprofen and salt water gargles eRx for Amoxil TID x 10 days  RTC as needed or if symptoms persist or worsen

## 2015-08-21 NOTE — Addendum Note (Signed)
Addended by: Annamarie MajorFUQUAY, Kendell Gammon S on: 08/21/2015 04:21 PM   Modules accepted: Orders, SmartSet

## 2015-11-17 ENCOUNTER — Other Ambulatory Visit: Payer: Self-pay | Admitting: Obstetrics and Gynecology

## 2015-11-17 DIAGNOSIS — Z1231 Encounter for screening mammogram for malignant neoplasm of breast: Secondary | ICD-10-CM

## 2015-11-26 ENCOUNTER — Other Ambulatory Visit: Payer: Self-pay | Admitting: Internal Medicine

## 2015-11-26 DIAGNOSIS — K219 Gastro-esophageal reflux disease without esophagitis: Secondary | ICD-10-CM

## 2015-12-08 ENCOUNTER — Ambulatory Visit: Payer: BC Managed Care – PPO

## 2016-04-25 ENCOUNTER — Ambulatory Visit (INDEPENDENT_AMBULATORY_CARE_PROVIDER_SITE_OTHER): Payer: BC Managed Care – PPO | Admitting: Internal Medicine

## 2016-04-25 ENCOUNTER — Encounter: Payer: Self-pay | Admitting: Internal Medicine

## 2016-04-25 VITALS — BP 118/80 | HR 87 | Temp 98.0°F | Wt 207.0 lb

## 2016-04-25 DIAGNOSIS — L308 Other specified dermatitis: Secondary | ICD-10-CM

## 2016-04-25 MED ORDER — CLOBETASOL PROPIONATE 0.05 % EX CREA: 1.0000 "application " | TOPICAL_CREAM | Freq: Two times a day (BID) | CUTANEOUS | 2 refills | Status: DC

## 2016-04-25 NOTE — Patient Instructions (Signed)
Atopic Dermatitis Atopic dermatitis is a skin disorder that causes inflammation of the skin. This is the most common type of eczema. Eczema is a group of skin conditions that cause the skin to be itchy, red, and swollen. This condition is generally worse during the cooler winter months and often improves during the warm summer months. Symptoms can vary from person to person. Atopic dermatitis usually starts showing signs in infancy and can last through adulthood. This condition cannot be passed from one person to another (non-contagious), but is more common in families. Atopic dermatitis may not always be present. When it is present, it is called a flare-up. What are the causes? The exact cause of this condition is not known. Flare-ups of the condition may be triggered by:  Contact with something you are sensitive or allergic to.  Stress.  Certain foods.  Extremely hot or cold weather.  Harsh chemicals and soaps.  Dry air.  Chlorine. What increases the risk? This condition is more likely to develop in people who have a personal history or family history of eczema, allergies, asthma, or hay fever. What are the signs or symptoms? Symptoms of this condition include:  Dry, scaly skin.  Red, itchy rash.  Itchiness, which can be severe. This may occur before the skin rash. This can make sleeping difficult.  Skin thickening and cracking can occur over time. How is this diagnosed? This condition is diagnosed based on your symptoms, a medical history, and a physical exam. How is this treated? There is no cure for this condition, but symptoms can usually be controlled. Treatment focuses on:  Controlling the itching and scratching. You may be given medicines, such as antihistamines or steroid creams.  Limiting exposure to things that you are sensitive or allergic to (allergens).  Recognizing situations that cause stress and developing a plan to manage stress. If your atopic dermatitis  does not get better with medicines or is all over your body (widespread) , a treatment using a specific type of light (phototherapy) may be used. Follow these instructions at home: Skin care  Keep your skin well-moisturized. This seals in moisture and help prevent dryness.  Use unscented lotions that have petroleum in them.  Avoid lotions that contain alcohol and water. They can dry the skin.  Keep baths or showers short (less than 5 minutes) in warm water. Do not use hot water.  Use mild, unscented cleansers for bathing. Avoid soap and bubble bath.  Apply a moisturizer to your skin right after a bath or shower.   Do not apply anything to your skin without checking with your health care provider. General instructions  Dress in clothes made of cotton or cotton blends. Dress lightly because heat increases itching.  When washing your clothes, rinse your clothes twice so all of the soap is removed.  Avoid any triggers that can cause a flare-up.  Try to manage your stress.  Keep your fingernails cut short.  Avoid scratching. Scratching makes the rash and itching worse. It may also result in a skin infection (impetigo) due to a break in the skin caused by scratching.  Take or apply over-the-counter and prescription medicines only as told by your health care provider.  Keep all follow-up visits as told by your health care provider. This is important.  Do not be around people who have cold sores or fever blisters. If you get the infection, it may cause your atopic dermatitis to worsen. Contact a health care provider if:  Your itching   interferes with sleep.  Your rash gets worse or is not better within one week of starting treatment.  You have a fever.  You have a rash flare-up after having contact with someone who has cold sores or fever blisters. Get help right away if:  You develop pus or soft yellow scabs in the rash area. Summary  This condition causes a red rash and  itchy, dry, scaly skin.  Treatment focuses on controlling the itching and scratching, limiting exposure to things that you are sensitive or allergic to (allergens), and recognizing situations that cause stress and developing a plan to manage stress.  Keep your skin well-moisturized.  Keep baths or showers less than 5 minutes. This information is not intended to replace advice given to you by your health care provider. Make sure you discuss any questions you have with your health care provider. Document Released: 02/26/2000 Document Revised: 08/06/2015 Document Reviewed: 10/01/2012 Elsevier Interactive Patient Education  2017 Elsevier Inc.  

## 2016-04-25 NOTE — Progress Notes (Signed)
Subjective:    Patient ID: Breanna Day, female    DOB: 08/31/1965, 51 y.o.   MRN: [id number]  HPI  Pt presents to the clinic today with c/o a rash to her bilateral lower legs. She noticed this 2-3 months ago. The rash is itchy. It has not spread. She denies changes in soaps, lotions or detergents. She has tried Hydrocortisone cream OTC with minimal relief.  Review of Systems      Past Medical History:  Diagnosis Date  . Allergy   . Chicken pox   . Depression     Current Outpatient Prescriptions  Medication Sig Dispense Refill  . hydrOXYzine (VISTARIL) 25 MG capsule Take 1 tablet (25 mg total) by mouth 2 (two) times daily. 60 capsule 5  . phentermine 37.5 MG capsule Take by mouth.    . metFORMIN (GLUCOPHAGE) 500 MG tablet Take 1 tablet by mouth 2 (two) times daily.    . pantoprazole (PROTONIX) 40 MG tablet Take 1 tablet (40 mg total) by mouth 2 (two) times daily before a meal. 60 tablet 5   No current facility-administered medications for this visit.     No Known Allergies  Family History  Problem Relation Age of Onset  . Hypertension Mother   . Arthritis Mother   . Hyperlipidemia Mother   . Hypertension Father   . Arthritis Father   . Hyperlipidemia Father   . Diabetes Maternal Grandmother   . Heart disease Maternal Grandmother   . Arthritis Maternal Grandmother   . Hypertension Maternal Grandmother   . Cancer Maternal Grandfather   . Heart disease Maternal Grandfather   . Arthritis Maternal Grandfather   . Hypertension Maternal Grandfather   . Diabetes Paternal Grandmother   . Cancer Paternal Grandmother   . Arthritis Paternal Grandmother   . Hypertension Paternal Grandmother   . Diabetes Paternal Grandfather   . Heart disease Paternal Grandfather   . Arthritis Paternal Grandfather   . Hypertension Paternal Grandfather   . Cancer Maternal Uncle     Colon  . Breast cancer Paternal Aunt [id number]    Social History   Social History  . Marital status: Married    Spouse name: N/A  . Number of children: N/A  . Years of education: N/A   Occupational History  . Not on file.   Social History Main Topics  . Smoking status: Never Smoker  . Smokeless tobacco: Never Used  . Alcohol use No  . Drug use: No  . Sexual activity: Yes   Other Topics Concern  . Not on file   Social History Narrative  . No narrative on file     Constitutional: Denies fever, malaise, fatigue, headache or abrupt weight changes.  Skin: Pt reports rash of BLE. Denies ulcercations.    No other specific complaints in a complete review of systems (except as listed in HPI above).  Objective:   Physical Exam  BP 118/80   Pulse 87   Temp 98 F (36.7 C) (Oral)   Wt 207 lb (93.9 kg)   LMP 02/01/2016 Comment: irregular  SpO2 98%   BMI 37.26 kg/m  Wt Readings from Last 3 Encounters:  04/25/16 207 lb (93.9 kg)  08/21/15 209 lb 8 oz (95 kg)  05/21/15 199 lb 8 oz (90.5 kg)    General: Appears her stated age,obese in NAD. Skin: Thick, raised, erythematous plaques noted on BLE.      Assessment & Plan:   Eczema, BLE:  eRx for Clobetasol cream  BID Avoid hot showers Use moisturizing lotion BID  RTC as needed or if symptoms persist or worsen Dmitry Macomber, NP

## 2016-05-09 ENCOUNTER — Other Ambulatory Visit: Payer: Self-pay | Admitting: Internal Medicine

## 2016-05-09 DIAGNOSIS — K219 Gastro-esophageal reflux disease without esophagitis: Secondary | ICD-10-CM

## 2016-05-25 ENCOUNTER — Ambulatory Visit (INDEPENDENT_AMBULATORY_CARE_PROVIDER_SITE_OTHER): Payer: BC Managed Care – PPO | Admitting: Family Medicine

## 2016-05-25 ENCOUNTER — Encounter: Payer: Self-pay | Admitting: Family Medicine

## 2016-05-25 VITALS — BP 128/90 | HR 94 | Temp 98.4°F | Wt 204.0 lb

## 2016-05-25 DIAGNOSIS — R300 Dysuria: Secondary | ICD-10-CM

## 2016-05-25 LAB — POC URINALSYSI DIPSTICK (AUTOMATED)
Bilirubin, UA: NEGATIVE
Glucose, UA: NEGATIVE
Ketones, UA: NEGATIVE
Nitrite, UA: NEGATIVE
PH UA: 6
PROTEIN UA: NEGATIVE
SPEC GRAV UA: 1.025
Urobilinogen, UA: 0.2

## 2016-05-25 MED ORDER — SULFAMETHOXAZOLE-TRIMETHOPRIM 800-160 MG PO TABS: 1.0000 | ORAL_TABLET | Freq: Two times a day (BID) | ORAL | 0 refills | Status: DC

## 2016-05-25 NOTE — Progress Notes (Signed)
Dysuria: yes duration of symptoms: since yesterday abdominal pain: no fevers:no back pain: some lower back pain at baseline.   vomiting:no U/a d/w pt.   ucx pending.  This feels similar to prev UTIs.    Meds, vitals, and allergies reviewed.   Per HPI unless specifically indicated in ROS section   GEN: nad, alert and oriented NECK: supple CV: rrr.  PULM: ctab, no inc wob ABD: soft, +bs, suprapubic area not tender EXT: no edema BACK: no CVA pain

## 2016-05-25 NOTE — Progress Notes (Signed)
Pre visit review using our clinic review tool, if applicable. No additional management support is needed unless otherwise documented below in the visit note. 

## 2016-05-25 NOTE — Patient Instructions (Signed)
Drink plenty of water and start the antibiotics today.  We'll contact you with your lab report.  Take care.   

## 2016-05-26 DIAGNOSIS — R300 Dysuria: Secondary | ICD-10-CM | POA: Insufficient documentation

## 2016-05-26 NOTE — Assessment & Plan Note (Signed)
Likely cystitis. Start Septra. Check urine culture. See after visit summary. Okay for outpatient follow-up.

## 2016-05-27 ENCOUNTER — Other Ambulatory Visit: Payer: Self-pay | Admitting: Family Medicine

## 2016-05-27 LAB — URINE CULTURE

## 2016-05-27 MED ORDER — CEPHALEXIN 500 MG PO CAPS: 500.0000 mg | ORAL_CAPSULE | Freq: Two times a day (BID) | ORAL | 0 refills | Status: DC

## 2016-07-05 ENCOUNTER — Other Ambulatory Visit: Payer: Self-pay | Admitting: Internal Medicine

## 2016-07-05 DIAGNOSIS — K219 Gastro-esophageal reflux disease without esophagitis: Secondary | ICD-10-CM

## 2016-11-22 ENCOUNTER — Other Ambulatory Visit: Payer: Self-pay | Admitting: Obstetrics and Gynecology

## 2016-11-22 DIAGNOSIS — Z1231 Encounter for screening mammogram for malignant neoplasm of breast: Secondary | ICD-10-CM

## 2016-12-22 ENCOUNTER — Ambulatory Visit
Admission: RE | Admit: 2016-12-22 | Discharge: 2016-12-22 | Disposition: A | Discharge status: Home / Self Care | Payer: BC Managed Care – PPO | Source: Ambulatory Visit | Attending: Obstetrics and Gynecology | Admitting: Obstetrics and Gynecology

## 2016-12-22 DIAGNOSIS — Z1231 Encounter for screening mammogram for malignant neoplasm of breast: Secondary | ICD-10-CM | POA: Diagnosis not present

## 2016-12-26 ENCOUNTER — Other Ambulatory Visit: Payer: Self-pay | Admitting: Obstetrics and Gynecology

## 2016-12-26 DIAGNOSIS — N6310 Unspecified lump in the right breast, unspecified quadrant: Secondary | ICD-10-CM

## 2016-12-26 DIAGNOSIS — R928 Other abnormal and inconclusive findings on diagnostic imaging of breast: Secondary | ICD-10-CM

## 2016-12-29 ENCOUNTER — Ambulatory Visit
Admission: RE | Admit: 2016-12-29 | Discharge: 2016-12-29 | Disposition: A | Discharge status: Home / Self Care | Payer: BC Managed Care – PPO | Source: Ambulatory Visit | Attending: Obstetrics and Gynecology | Admitting: Obstetrics and Gynecology

## 2016-12-29 DIAGNOSIS — R928 Other abnormal and inconclusive findings on diagnostic imaging of breast: Secondary | ICD-10-CM

## 2016-12-29 DIAGNOSIS — N6310 Unspecified lump in the right breast, unspecified quadrant: Secondary | ICD-10-CM

## 2016-12-29 DIAGNOSIS — N6489 Other specified disorders of breast: Secondary | ICD-10-CM | POA: Insufficient documentation

## 2017-01-13 ENCOUNTER — Other Ambulatory Visit: Payer: Self-pay | Admitting: Internal Medicine

## 2017-03-02 ENCOUNTER — Ambulatory Visit: Payer: BC Managed Care – PPO | Admitting: Family Medicine

## 2017-03-22 ENCOUNTER — Encounter: Payer: Self-pay | Admitting: Emergency Medicine

## 2017-03-22 ENCOUNTER — Emergency Department: Payer: BC Managed Care – PPO

## 2017-03-22 ENCOUNTER — Other Ambulatory Visit: Payer: Self-pay

## 2017-03-22 ENCOUNTER — Emergency Department
Admission: EM | Admit: 2017-03-22 | Discharge: 2017-03-22 | Disposition: A | Discharge status: Home / Self Care | Payer: BC Managed Care – PPO | Attending: Emergency Medicine | Admitting: Emergency Medicine

## 2017-03-22 DIAGNOSIS — Z79899 Other long term (current) drug therapy: Secondary | ICD-10-CM | POA: Diagnosis not present

## 2017-03-22 DIAGNOSIS — M25562 Pain in left knee: Secondary | ICD-10-CM | POA: Diagnosis not present

## 2017-03-22 DIAGNOSIS — Z7984 Long term (current) use of oral hypoglycemic drugs: Secondary | ICD-10-CM | POA: Diagnosis not present

## 2017-03-22 MED ORDER — CYCLOBENZAPRINE HCL 10 MG PO TABS
10.0000 mg | ORAL_TABLET | Freq: Once | ORAL | Status: AC
  Administered 2017-03-22: 10 mg via ORAL
  Filled 2017-03-22: qty 1

## 2017-03-22 MED ORDER — OXYCODONE HCL 5 MG PO TABS
5.0000 mg | ORAL_TABLET | Freq: Once | ORAL | Status: AC
Start: 2017-03-22 — End: 2017-03-22
  Administered 2017-03-22: 5 mg via ORAL
  Filled 2017-03-22: qty 1

## 2017-03-22 MED ORDER — TRAMADOL HCL 50 MG PO TABS: 50.0000 mg | ORAL_TABLET | Freq: Four times a day (QID) | ORAL | 0 refills | Status: DC | PRN

## 2017-03-22 MED ORDER — CYCLOBENZAPRINE HCL 10 MG PO TABS: 10.0000 mg | ORAL_TABLET | Freq: Three times a day (TID) | ORAL | 0 refills | Status: DC | PRN

## 2017-03-22 NOTE — ED Triage Notes (Signed)
Patient to ER for c/o left knee pain. Denies any known injury. States a few days ago noticed knee pain, tonight felt pop going up stairs and knee pain felt worse. Denies any redness or signs of infection.

## 2017-03-22 NOTE — ED Provider Notes (Signed)
[facility] Emergency Department Provider Note ____________________________________________  Time seen: Approximately 11:31 PM  I have reviewed the triage vital signs and the nursing notes.   HISTORY  Chief Complaint Knee Pain    HPI Breanna Day is a 52 y.o. female who presents to the emergency department for treatment and evaluation of left knee pain.  Patient states that earlier today she was climbing a set of stairs in her home and heard her knee "pop" since that time she has not been able to weight on the left leg due to pain.  She has no previous knee injuries that she is aware of.  She has taken Aleve without relief.  She denies swelling or other concerning signs.   Past Medical History:  Diagnosis Date  . Allergy   . Chicken pox   . Depression     Patient Active Problem List   Diagnosis Date Noted  . Burning with urination 05/26/2016  . Gastroesophageal reflux disease without esophagitis 06/24/2014  . 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 Seasonal allergies 06/24/2014  . Depression 06/24/2014  . Obesity (BMI 35.0-39.9 without comorbidity) 06/24/2014    History reviewed. No pertinent surgical history.  Prior to Admission medications   Medication Sig Start Date End Date Taking? Authorizing Provider  cephALEXin (KEFLEX) 500 MG capsule Take 1 capsule (500 mg total) by mouth 2 (two) times daily. 05/27/16   Joaquim Nam, MD  clobetasol cream (TEMOVATE) 0.05 % Apply 1 application 2 (two) times daily topically. MUST SCHEDULE ANNUAL PHYSICAL 01/16/17   Lorre Munroe, NP  cyclobenzaprine (FLEXERIL) 10 MG tablet Take 1 tablet (10 mg total) by mouth 3 (three) times daily as needed for muscle spasms. 03/22/17   Satin Boal, Rulon Eisenmenger B, FNP  hydrOXYzine (VISTARIL) 25 MG capsule Take 1 capsule (25 mg total) by mouth 2 (two) times daily. MUST SCHEDULE ANNUAL PHYSICAL 07/06/16   Lorre Munroe, NP  metFORMIN (GLUCOPHAGE) 500 MG tablet Take 1 tablet by mouth 2 (two) times daily.  04/11/16   [provider]  pantoprazole (PROTONIX) 40 MG tablet Take 1 tablet (40 mg total) by mouth 2 (two) times daily before a meal. 07/06/16   Baity, Salvadore Oxford, NP  phentermine 37.5 MG capsule Take by mouth. 03/24/16   [provider]  traMADol (ULTRAM) 50 MG tablet Take 1 tablet (50 mg total) by mouth every 6 (six) hours as needed. 03/22/17   Chinita Pester, FNP    Allergies Patient has no known allergies.  Family History  Problem Relation Age of Onset  . Hypertension Mother   . Arthritis Mother   . Hyperlipidemia Mother   . Hypertension Father   . Arthritis Father   . Hyperlipidemia Father   . Diabetes Maternal Grandmother   . Heart disease Maternal Grandmother   . Arthritis Maternal Grandmother   . Hypertension Maternal Grandmother   . Cancer Maternal Grandfather   . Heart disease Maternal Grandfather   . Arthritis Maternal Grandfather   . Hypertension Maternal Grandfather   . Diabetes Paternal Grandmother   . Cancer Paternal Grandmother   . Arthritis Paternal Grandmother   . Hypertension Paternal Grandmother   . Diabetes Paternal Grandfather   . Heart disease Paternal Grandfather   . Arthritis Paternal Grandfather   . Hypertension Paternal Grandfather   . Cancer Maternal Uncle        Colon  . Breast cancer Paternal Aunt 73    Social History Social History   Tobacco Use  . Smoking  status: Never Smoker  . Smokeless tobacco: Never Used  Substance Use Topics  . Alcohol use: No    Alcohol/week: 0.0 oz  . Drug use: No    Review of Systems Constitutional: Negative for recent illness. Cardiovascular: Negative for chest pain. Respiratory: Negative for shortness of breath. Musculoskeletal: Positive for left knee pain. Skin: Negative for rash, lesion, or wound. Neurological: Negative for paresthesias.  ____________________________________________   PHYSICAL EXAM:  VITAL SIGNS: ED Triage Vitals  Enc Vitals Group     BP 03/22/17 1956 (!)  182/95     Pulse Rate 03/22/17 1956 91     Resp 03/22/17 1956 16     Temp 03/22/17 1956 98.6 F (37 C)     Temp Source 03/22/17 1956 Oral     SpO2 03/22/17 1956 96 %     Weight 03/22/17 1957 210 lb (95.3 kg)     Height 03/22/17 1957 5' 2.5" (1.588 m)     Head Circumference --      Peak Flow --      Pain Score 03/22/17 2004 8     Pain Loc --      Pain Edu? --      Excl. in GC? --     Constitutional: Alert and oriented. Well appearing and in no acute distress. Eyes: Conjunctivae are clear without discharge or drainage Head: Atraumatic Neck: Supple. Respiratory: Respirations are even and unlabored. Musculoskeletal: Left knee exam reveals pain on valgus stress.  Pain on McMurray's test.  No pain with anterior drawer test.  Lachman's is negative. Neurologic: Awake, alert, oriented x4.  Motor and sensation is intact. Skin: Intact without rash, lesion, or wound. Psychiatric: Behavior and affect are appropriate.  ____________________________________________   LABS (all labs ordered are listed, but only abnormal results are displayed)  Labs Reviewed - No data to display ____________________________________________  RADIOLOGY  Image of the left knee reveals no acute bony abnormality per radiology. ____________________________________________   PROCEDURES  Procedures  ____________________________________________   INITIAL IMPRESSION / ASSESSMENT AND PLAN / ED COURSE  Breanna Day is a 52 y.o. female who presents to the emergency department for acute onset, nontraumatic left knee pain.  Images do not reveal a bony/joint abnormality.  Exam is somewhat concerning for a lateral meniscus tear.  She was placed in a knee immobilizer and given prescriptions for Flexeril and tramadol.  She is to call and schedule follow-up appointment with orthopedics.  She was advised to return to the emergency department for symptoms of change or worsen if she is unable to see her primary care  provider or orthopedics.  Medications  oxyCODONE (Oxy IR/ROXICODONE) immediate release tablet 5 mg (5 mg Oral Given 03/22/17 2219)  cyclobenzaprine (FLEXERIL) tablet 10 mg (10 mg Oral Given 03/22/17 2219)    Pertinent labs & imaging results that were available during my care of the patient were reviewed by me and considered in my medical decision making (see chart for details).  _________________________________________   FINAL CLINICAL IMPRESSION(S) / ED DIAGNOSES  Final diagnoses:  Acute pain of left knee    ED Discharge Orders        Ordered    cyclobenzaprine (FLEXERIL) 10 MG tablet  3 times daily PRN     03/22/17 2241    traMADol (ULTRAM) 50 MG tablet  Every 6 hours PRN     03/22/17 2241       If controlled substance prescribed during this visit, 12 month history viewed on the [facility] prior  to issuing an initial prescription for Schedule II or III opiod.    Chinita Pesterriplett, Helio Lack B, FNP 03/22/17 [phone]    Sharman CheekStafford, Phillip, MD 03/25/17 [phone]

## 2017-03-22 NOTE — ED Notes (Signed)
Pt states the pain has been there since Thursday. Pt states she has been ambulatory until tonight when she was climbing steps heard a pop and since then no weight bearing on that leg. Pt is in wheelchair. Pt is A/Ox4 NAD awaiitng EDP.

## 2017-03-22 NOTE — Discharge Instructions (Signed)
Please follow-up with orthopedics if you are not improving over the week.  Rest, ice, and elevate your leg as often as possible for the next several days.  Return to the emergency department for further evaluation or symptoms of concern if you are unable to see your primary care doctor or the orthopedic specialist.

## 2017-11-21 ENCOUNTER — Other Ambulatory Visit: Payer: Self-pay | Admitting: Internal Medicine

## 2017-11-21 NOTE — Telephone Encounter (Signed)
Pt can try OTC hydrocortisone cream per Baity as it has been over 1 year since last OV and was notified that an annual physical exam is required... It is still a requirement to bee seen at least once a year for an annual physical exam with PCP even if one also chooses to see GYN

## 2017-11-21 NOTE — Telephone Encounter (Signed)
Copied from CRM [id number]. Topic: Quick Communication - Rx Refill/Question >> Nov 21, 2017  8:55 AM Oneal Grout wrote: Medication: clobetasol cream (TEMOVATE) 0.05 %  Has the patient contacted their pharmacy? Yes.   (Agent: If no, request that the patient contact the pharmacy for the refill.) (Agent: If yes, when and what did the pharmacy advise?)  Preferred Pharmacy (with phone number or street name): [facility]  Agent: Please be advised that RX refills may take up to 3 business days. We ask that you follow-up with your pharmacy.

## 2017-11-21 NOTE — Telephone Encounter (Addendum)
Called pt and left message to find out her symptoms  clobetasol refill Last Refill:01/16/17 # 30 g  Last OV: 04/25/16 PCP: Nicki Reaper NP Pharmacy:[facility].  Pt stated she is having eczema. Patches on legs behind the knee and anterior left leg.  small bumps then itches and larger and cream is the only things that helps it. Pt stated she uses it sparingly. Pt stated that she gets full physical with gynecologist. Pt stated that she is leaving for a cruise on Friday.

## 2017-11-21 NOTE — Addendum Note (Signed)
Addended by: Lonia Farber E on: 11/21/2017 12:00 PM   Modules accepted: Orders

## 2017-11-22 ENCOUNTER — Encounter: Payer: Self-pay | Admitting: Internal Medicine

## 2017-11-22 ENCOUNTER — Ambulatory Visit: Payer: BC Managed Care – PPO | Admitting: Internal Medicine

## 2017-11-22 DIAGNOSIS — F329 Major depressive disorder, single episode, unspecified: Secondary | ICD-10-CM | POA: Diagnosis not present

## 2017-11-22 DIAGNOSIS — L509 Urticaria, unspecified: Secondary | ICD-10-CM

## 2017-11-22 DIAGNOSIS — R7303 Prediabetes: Secondary | ICD-10-CM

## 2017-11-22 DIAGNOSIS — K219 Gastro-esophageal reflux disease without esophagitis: Secondary | ICD-10-CM

## 2017-11-22 MED ORDER — PANTOPRAZOLE SODIUM 40 MG PO TBEC
40.0000 mg | DELAYED_RELEASE_TABLET | Freq: Two times a day (BID) | ORAL | 2 refills | Status: DC
Start: 2017-11-22 — End: 2019-04-29

## 2017-11-22 MED ORDER — CLOBETASOL PROPIONATE 0.05 % EX CREA: 1.0000 "application " | TOPICAL_CREAM | Freq: Two times a day (BID) | CUTANEOUS | 2 refills | Status: DC

## 2017-11-22 MED ORDER — HYDROXYZINE PAMOATE 25 MG PO CAPS: 25.0000 mg | ORAL_CAPSULE | Freq: Two times a day (BID) | ORAL | 2 refills | Status: DC

## 2017-11-22 NOTE — Progress Notes (Signed)
Subjective:    Patient ID: Breanna Day, female    DOB: 21-Aug-1965, 52 y.o.   MRN: [id number]  HPI  Pt presents to the clinic today for follow up of chronic conditions.  Depression: Currently not an issue off meds.  GERD: She is not sure what triggers this. She denies breakthrough on Pantoprazole. There is no upper GI on file.  Urticaria: She is not sure what triggers this. She takes Hydroxyzine as prescribed and Clobetasol cream as needed. She would like a refill of Clobetasol today.  Prediabetes: She is taking Metformin 500 mg once daily. She is unable to tolerate it BID. She reports she gets her A1C checked at her GYN office.  Review of Systems      Past Medical History:  Diagnosis Date  . Allergy   . Chicken pox   . Depression     Current Outpatient Medications  Medication Sig Dispense Refill  . cephALEXin (KEFLEX) 500 MG capsule Take 1 capsule (500 mg total) by mouth 2 (two) times daily. 14 capsule 0  . clobetasol cream (TEMOVATE) 0.05 % Apply 1 application 2 (two) times daily topically. MUST SCHEDULE ANNUAL PHYSICAL 30 g 0  . cyclobenzaprine (FLEXERIL) 10 MG tablet Take 1 tablet (10 mg total) by mouth 3 (three) times daily as needed for muscle spasms. 30 tablet 0  . hydrOXYzine (VISTARIL) 25 MG capsule Take 1 capsule (25 mg total) by mouth 2 (two) times daily. MUST SCHEDULE ANNUAL PHYSICAL 60 capsule 1  . metFORMIN (GLUCOPHAGE) 500 MG tablet Take 1 tablet by mouth 2 (two) times daily.    . pantoprazole (PROTONIX) 40 MG tablet Take 1 tablet (40 mg total) by mouth 2 (two) times daily before a meal. 60 tablet 1  . phentermine 37.5 MG capsule Take by mouth.    . traMADol (ULTRAM) 50 MG tablet Take 1 tablet (50 mg total) by mouth every 6 (six) hours as needed. 20 tablet 0   No current facility-administered medications for this visit.     No Known Allergies  Family History  Problem Relation Age of Onset  . Hypertension Mother   . Arthritis Mother   . Hyperlipidemia  Mother   . Hypertension Father   . Arthritis Father   . Hyperlipidemia Father   . Diabetes Maternal Grandmother   . Heart disease Maternal Grandmother   . Arthritis Maternal Grandmother   . Hypertension Maternal Grandmother   . Cancer Maternal Grandfather   . Heart disease Maternal Grandfather   . Arthritis Maternal Grandfather   . Hypertension Maternal Grandfather   . Diabetes Paternal Grandmother   . Cancer Paternal Grandmother   . Arthritis Paternal Grandmother   . Hypertension Paternal Grandmother   . Diabetes Paternal Grandfather   . Heart disease Paternal Grandfather   . Arthritis Paternal Grandfather   . Hypertension Paternal Grandfather   . Cancer Maternal Uncle        Colon  . Breast cancer Paternal Aunt 49    Social History   Socioeconomic History  . Marital status: Married    Spouse name: Not on file  . Number of children: Not on file  . Years of education: Not on file  . Highest education level: Not on file  Occupational History  . Not on file  Social Needs  . Financial resource strain: Not on file  . Food insecurity:    Worry: Not on file    Inability: Not on file  . Transportation needs:  Medical: Not on file    Non-medical: Not on file  Tobacco Use  . Smoking status: Never Smoker  . Smokeless tobacco: Never Used  Substance and Sexual Activity  . Alcohol use: No    Alcohol/week: 0.0 standard drinks  . Drug use: No  . Sexual activity: Yes  Lifestyle  . Physical activity:    Days per week: Not on file    Minutes per session: Not on file  . Stress: Not on file  Relationships  . Social connections:    Talks on phone: Not on file    Gets together: Not on file    Attends religious service: Not on file    Active member of club or organization: Not on file    Attends meetings of clubs or organizations: Not on file    Relationship status: Not on file  . Intimate partner violence:    Fear of current or ex partner: Not on file    Emotionally  abused: Not on file    Physically abused: Not on file    Forced sexual activity: Not on file  Other Topics Concern  . Not on file  Social History Narrative  . Not on file     Constitutional: Denies fever, malaise, fatigue, headache or abrupt weight changes.  HEENT: Denies eye pain, eye redness, ear pain, ringing in the ears, wax buildup, runny nose, nasal congestion, bloody nose, or sore throat. Respiratory: Denies difficulty breathing, shortness of breath, cough or sputum production.   Cardiovascular: Denies chest pain, chest tightness, palpitations or swelling in the hands or feet.  Gastrointestinal: Pt reports reflux. Denies abdominal pain, bloating, constipation, diarrhea or blood in the stool.  GU: Denies urgency, frequency, pain with urination, burning sensation, blood in urine, odor or discharge. Musculoskeletal: Denies decrease in range of motion, difficulty with gait, muscle pain or joint pain and swelling.  Skin: Pt reports intermittent hives. Denies redness, rashes, lesions or ulcercations.  Neurological: Denies dizziness, difficulty with memory, difficulty with speech or problems with balance and coordination.  Psych: Denies anxiety, depression, SI/HI.  No other specific complaints in a complete review of systems (except as listed in HPI above).  Objective:   Physical Exam   BP 126/84   Pulse 84   Temp 97.9 F (36.6 C) (Oral)   Wt 204 lb (92.5 kg)   SpO2 98%   BMI 36.72 kg/m  Wt Readings from Last 3 Encounters:  11/22/17 204 lb (92.5 kg)  03/22/17 210 lb (95.3 kg)  05/25/16 204 lb (92.5 kg)    General: Appears her stated age, obese in NAD. Skin: Eczema noted on left anterior shin. Cardiovascular: Normal rate and rhythm.  Pulmonary/Chest: Normal effort and positive vesicular breath sounds. No respiratory distress. No wheezes, rales or ronchi noted.  Abdomen: Soft and nontender. Normal bowel sounds.  Neurological: Alert and oriented.  Psychiatric: Mood and  affect normal. Behavior is normal. Judgment and thought content normal.         Assessment & Plan:

## 2017-11-22 NOTE — Assessment & Plan Note (Signed)
Currently not an issue Will monitor 

## 2017-11-22 NOTE — Assessment & Plan Note (Signed)
Continue Hydroxyzine and Clobetasol Clobetasol refilled today

## 2017-11-22 NOTE — Telephone Encounter (Addendum)
Pt notified as instructed and pt voiced understanding. Pt said OTC hydrocortisone cream is not working and pt scheduled appt for med refill 11/22/17 at 4PM. FYI to Pamala Hurry NP.

## 2017-11-22 NOTE — Assessment & Plan Note (Signed)
Will check A1C with annual exam Continue Metformin daily

## 2017-11-22 NOTE — Patient Instructions (Signed)

## 2017-11-22 NOTE — Assessment & Plan Note (Signed)
Discussed weaning Pantoprazole, she wants to hold off at this time Will check CBC and CMET with annual exam

## 2017-12-12 ENCOUNTER — Other Ambulatory Visit: Payer: Self-pay | Admitting: Obstetrics and Gynecology

## 2017-12-12 DIAGNOSIS — N6489 Other specified disorders of breast: Secondary | ICD-10-CM

## 2017-12-18 LAB — HM PAP SMEAR: HM Pap smear: POSITIVE

## 2017-12-26 ENCOUNTER — Encounter: Payer: Self-pay | Admitting: Radiology

## 2017-12-26 ENCOUNTER — Ambulatory Visit
Admission: RE | Admit: 2017-12-26 | Discharge: 2017-12-26 | Disposition: A | Discharge status: Home / Self Care | Payer: BC Managed Care – PPO | Source: Ambulatory Visit | Attending: Obstetrics and Gynecology | Admitting: Obstetrics and Gynecology

## 2017-12-26 DIAGNOSIS — N6489 Other specified disorders of breast: Secondary | ICD-10-CM | POA: Diagnosis present

## 2018-03-13 IMAGING — MG MM DIGITAL SCREENING BILAT W/ TOMO W/ CAD
8 of 13 series · 8 of 29 positions shown · non-contrast
Comparison: Previous exam(s).

CLINICAL DATA: Screening.

EXAM:
2D DIGITAL SCREENING BILATERAL MAMMOGRAM WITH CAD AND ADJUNCT TOMO

[R MLO (1 of 2)]
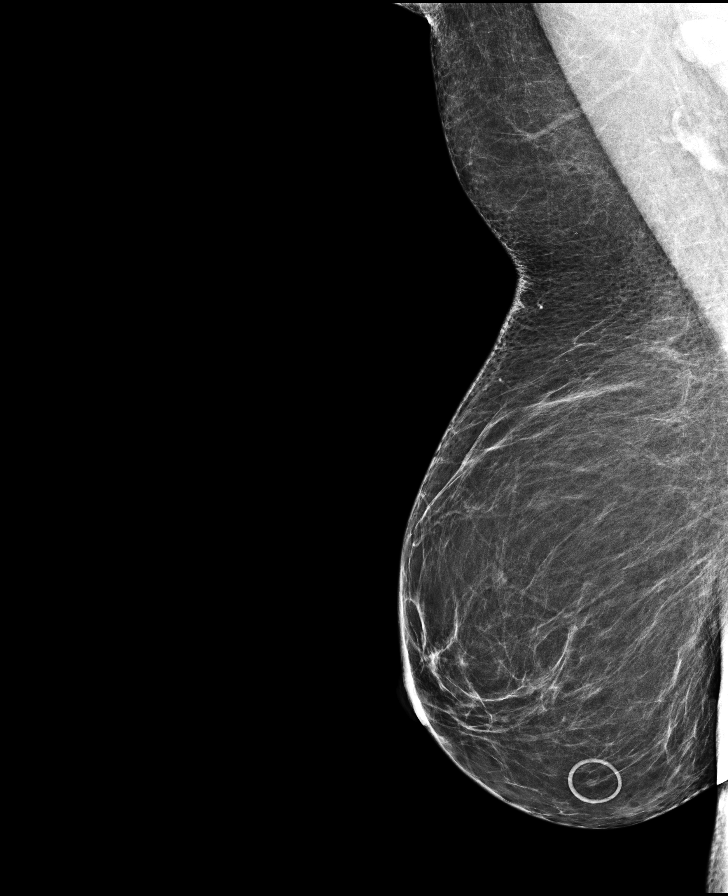

[L MLO]
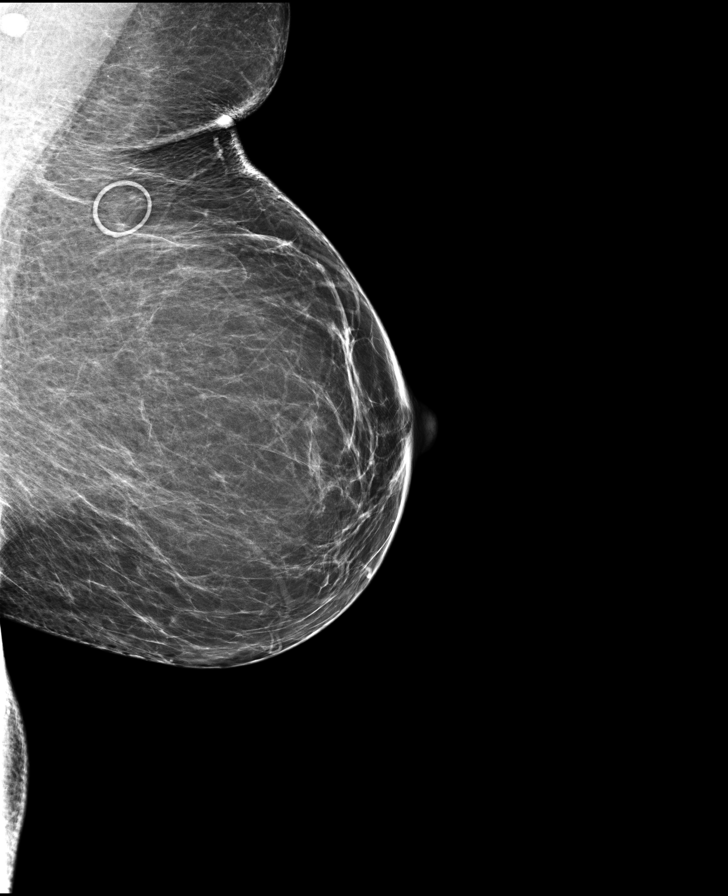

[R CC synth-2D]
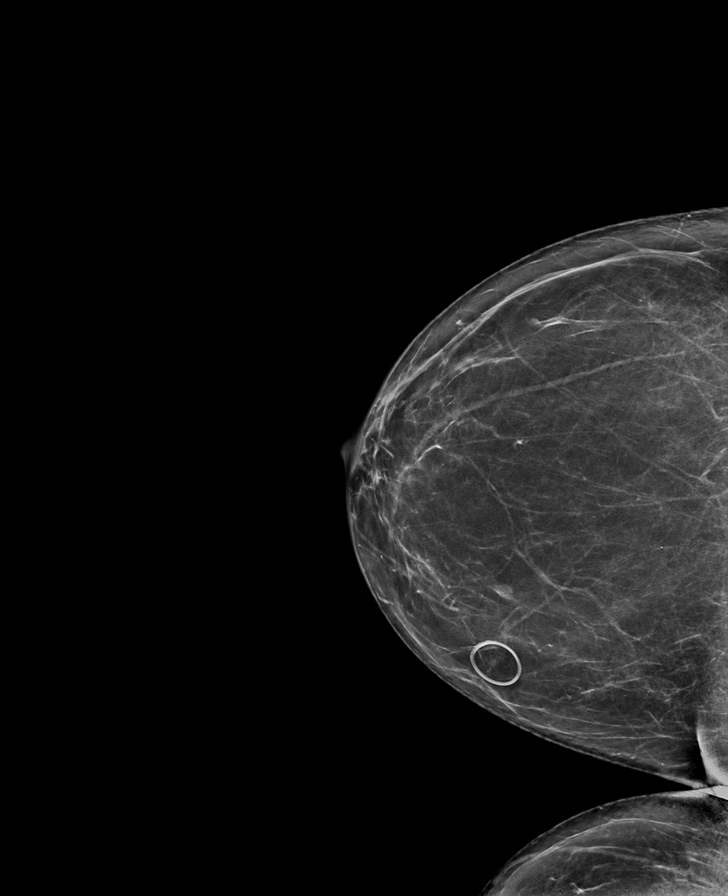

[L CC synth-2D]
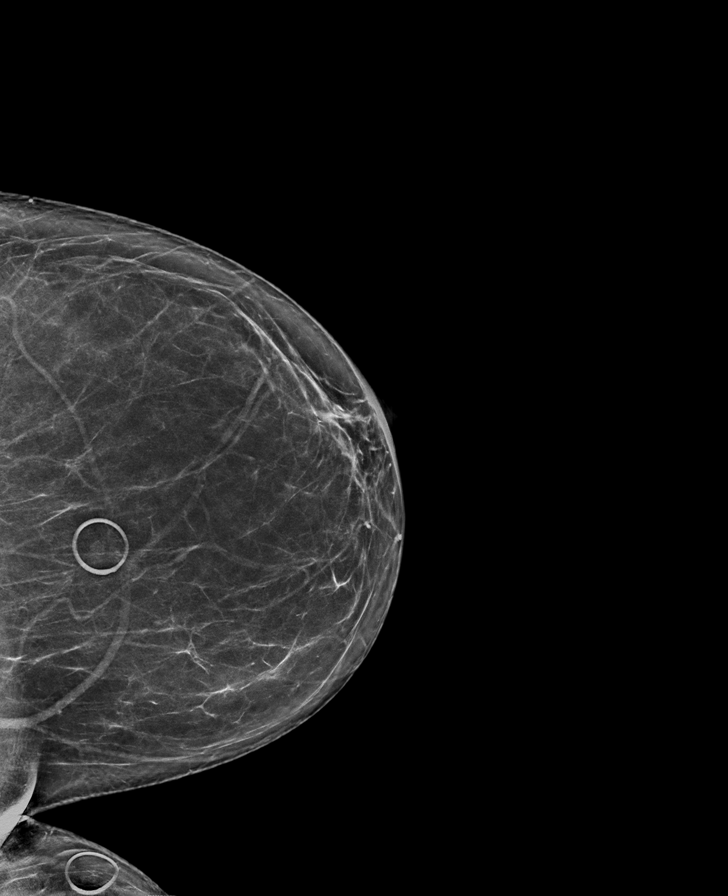

[R CC]
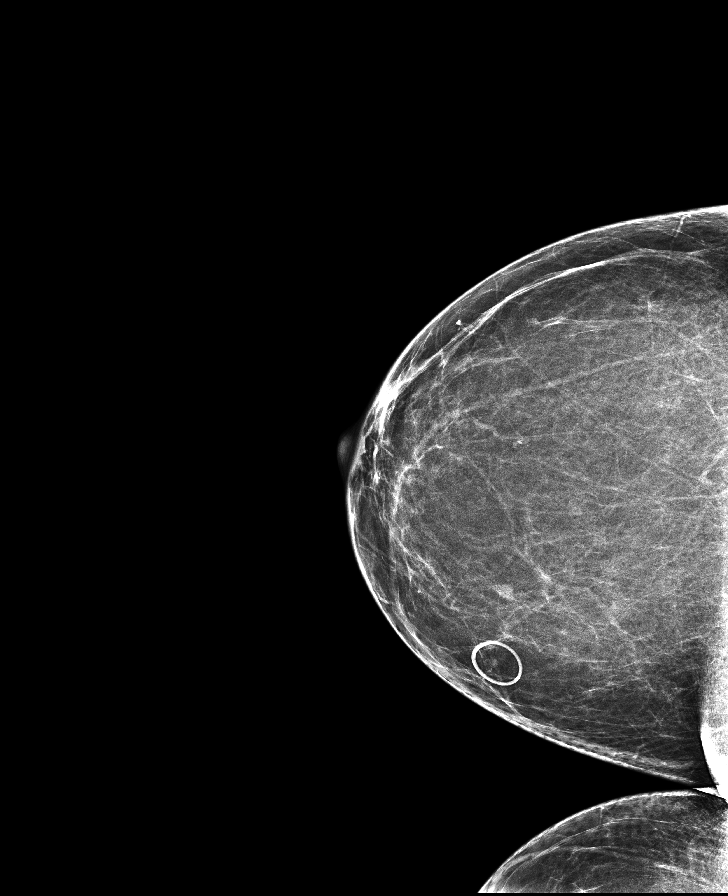

[R MLO (2 of 2)]
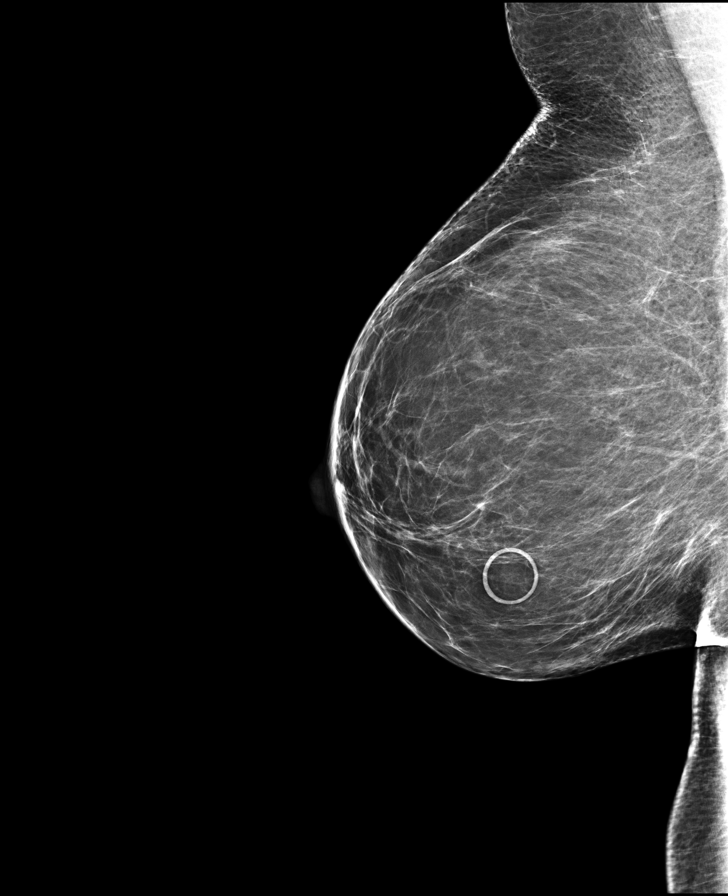

[L CC]
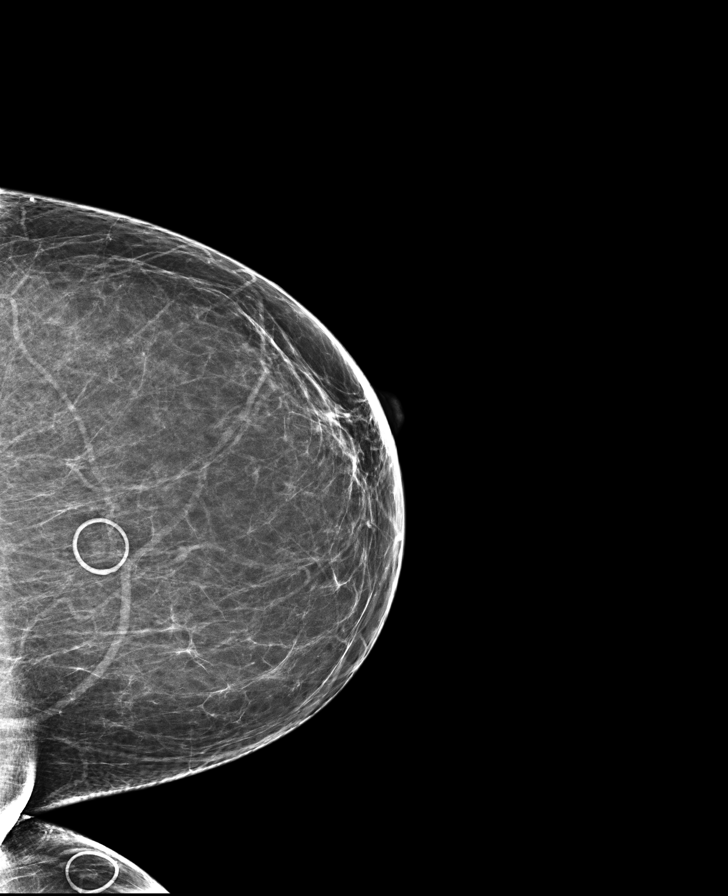

[L MLO synth-2D]
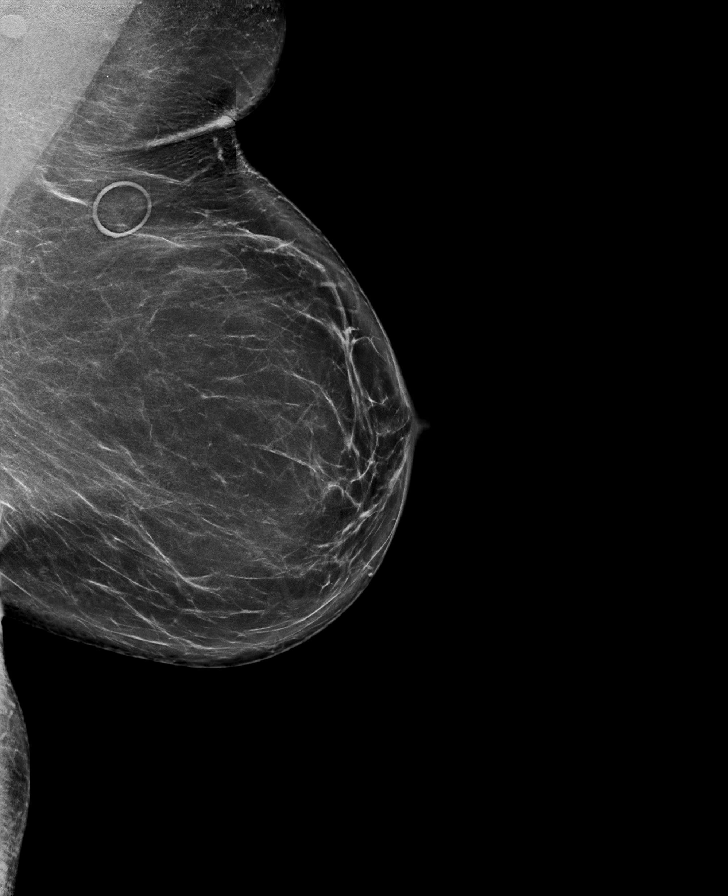

[8 of 29 positions shown; findings below may reference images not displayed]

ACR Breast Density Category b: There are scattered areas of
fibroglandular density.
FINDINGS: In the right breast, a possible mass warrants further evaluation. In
the left breast, no findings suspicious for malignancy. Images were
processed with CAD.
IMPRESSION: Further evaluation is suggested for possible mass in the right
breast.

RECOMMENDATION:
Diagnostic mammogram and possibly ultrasound of the right breast.
(Code:[id number])

The patient will be contacted regarding the findings, and additional
imaging will be scheduled.

BI-RADS CATEGORY  0: Incomplete. Need additional imaging evaluation
and/or prior mammograms for comparison.

## 2018-05-17 ENCOUNTER — Ambulatory Visit: Payer: BC Managed Care – PPO | Admitting: Internal Medicine

## 2018-05-17 ENCOUNTER — Encounter: Payer: Self-pay | Admitting: Internal Medicine

## 2018-05-17 VITALS — BP 126/82 | HR 101 | Temp 98.1°F | Wt 204.0 lb

## 2018-05-17 DIAGNOSIS — J00 Acute nasopharyngitis [common cold]: Secondary | ICD-10-CM | POA: Diagnosis not present

## 2018-05-17 MED ORDER — AZITHROMYCIN 250 MG PO TABS
ORAL_TABLET | ORAL | 0 refills | Status: DC
Start: 2018-05-17 — End: 2018-08-13

## 2018-05-17 MED ORDER — HYDROCODONE-HOMATROPINE 5-1.5 MG/5ML PO SYRP: 5.0000 mL | ORAL_SOLUTION | Freq: Three times a day (TID) | ORAL | 0 refills | Status: DC | PRN

## 2018-05-17 NOTE — Patient Instructions (Signed)

## 2018-05-17 NOTE — Progress Notes (Signed)
HPI  Pt presents to the clinic today with c/o nasal congestion, sore throat, and cough . She reports this started 1 month ago. She is not blowing anything out of her nose. She denies difficulty swallowing. The cough is productive at times of green mucous. She denies ear pain or shortness of breath. She denies fever, chills or body aches currently. She started having flu like symptoms on 2/4. She went to UC on 2/6, tested negative for flu, was advised to use OTC medications. She has tried Mucinex, Catering manager and Delsym with minimal relief. She has a history of allergies but denies breathing problems. She has had sick contacts.  Review of Systems      Past Medical History:  Diagnosis Date  . Allergy   . Chicken pox   . Depression     Family History  Problem Relation Age of Onset  . Hypertension Mother   . Arthritis Mother   . Hyperlipidemia Mother   . Hypertension Father   . Arthritis Father   . Hyperlipidemia Father   . Diabetes Maternal Grandmother   . Heart disease Maternal Grandmother   . Arthritis Maternal Grandmother   . Hypertension Maternal Grandmother   . Cancer Maternal Grandfather   . Heart disease Maternal Grandfather   . Arthritis Maternal Grandfather   . Hypertension Maternal Grandfather   . Diabetes Paternal Grandmother   . Cancer Paternal Grandmother   . Arthritis Paternal Grandmother   . Hypertension Paternal Grandmother   . Diabetes Paternal Grandfather   . Heart disease Paternal Grandfather   . Arthritis Paternal Grandfather   . Hypertension Paternal Grandfather   . Cancer Maternal Uncle        Colon  . Breast cancer Paternal Aunt 40    Social History   Socioeconomic History  . Marital status: Married    Spouse name: Not on file  . Number of children: Not on file  . Years of education: Not on file  . Highest education level: Not on file  Occupational History  . Not on file  Social Needs  . Financial resource strain: Not on file  . Food  insecurity:    Worry: Not on file    Inability: Not on file  . Transportation needs:    Medical: Not on file    Non-medical: Not on file  Tobacco Use  . Smoking status: Never Smoker  . Smokeless tobacco: Never Used  Substance and Sexual Activity  . Alcohol use: No    Alcohol/week: 0.0 standard drinks  . Drug use: No  . Sexual activity: Yes  Lifestyle  . Physical activity:    Days per week: Not on file    Minutes per session: Not on file  . Stress: Not on file  Relationships  . Social connections:    Talks on phone: Not on file    Gets together: Not on file    Attends religious service: Not on file    Active member of club or organization: Not on file    Attends meetings of clubs or organizations: Not on file    Relationship status: Not on file  . Intimate partner violence:    Fear of current or ex partner: Not on file    Emotionally abused: Not on file    Physically abused: Not on file    Forced sexual activity: Not on file  Other Topics Concern  . Not on file  Social History Narrative  . Not on file  No Known Allergies   Constitutional: Positive fatigue. Denies headache, fever or abrupt weight changes.  HEENT:  Positive nasal congestion, sore throat. Denies eye redness, eye pain, pressure behind the eyes, facial pain, ear pain, ringing in the ears, wax buildup, runny nose or bloody nose. Respiratory: Positive cough. Denies difficulty breathing or shortness of breath.  Cardiovascular: Denies chest pain, chest tightness, palpitations or swelling in the hands or feet.   No other specific complaints in a complete review of systems (except as listed in HPI above).  Objective:   BP 126/82   Pulse (!) 101   Temp 98.1 F (36.7 C) (Oral)   Wt 204 lb (92.5 kg)   LMP 02/01/2016 Comment: irregular  SpO2 98%   BMI 36.72 kg/m  Wt Readings from Last 3 Encounters:  05/17/18 204 lb (92.5 kg)  11/22/17 204 lb (92.5 kg)  03/22/17 210 lb (95.3 kg)     General: Appears  her stated age,  in NAD. HEENT: Head: normal shape and size, no sinus tenderness noted;  Ears: Tm's gray and intact, normal light reflex; Nose: mucosa boggy and moist, turbinates swollen; Throat/Mouth: + PND. Teeth present, mucosa pink and moist, no exudate noted, no lesions or ulcerations noted.  Neck: No cervical lymphadenopathy.  Cardiovascular: Normal rate and rhythm.  Pulmonary/Chest: Normal effort and positive vesicular breath sounds. No respiratory distress. No wheezes, rales or ronchi noted.       Assessment & Plan:   Upper Respiratory Infection:  Get some rest and drink plenty of water Do salt water gargles for the sore throat Start Zyrtec and Flonase OTC eRx for Azithromax x 5 days eRx for Hycodan cough syrup  RTC as needed or if symptoms persist.   Nicki Reaper, NP

## 2018-08-13 ENCOUNTER — Encounter: Payer: Self-pay | Admitting: Internal Medicine

## 2018-08-13 ENCOUNTER — Ambulatory Visit (INDEPENDENT_AMBULATORY_CARE_PROVIDER_SITE_OTHER): Payer: BC Managed Care – PPO | Admitting: Internal Medicine

## 2018-08-13 DIAGNOSIS — F419 Anxiety disorder, unspecified: Secondary | ICD-10-CM

## 2018-08-13 DIAGNOSIS — F329 Major depressive disorder, single episode, unspecified: Secondary | ICD-10-CM | POA: Diagnosis not present

## 2018-08-13 MED ORDER — SERTRALINE HCL 50 MG PO TABS: ORAL_TABLET | ORAL | 2 refills | Status: DC

## 2018-08-13 MED ORDER — VENLAFAXINE HCL ER 150 MG PO CP24: 150.0000 mg | ORAL_CAPSULE | Freq: Every day | ORAL | 1 refills | Status: DC

## 2018-08-13 MED ORDER — HYDROXYZINE PAMOATE 25 MG PO CAPS: 25.0000 mg | ORAL_CAPSULE | Freq: Every day | ORAL | 0 refills | Status: DC | PRN

## 2018-08-13 NOTE — Assessment & Plan Note (Signed)
PHQ 9 score of 9, GAD score of 10 Support offered today Encouraged referral for therapy, she declines at this time Will increase Effexor to 150 mg PO daily Refilled Hydroxyzine as needed for intermittent anxiety  Advised her to update me in 1 month and let me know how she is doing

## 2018-08-13 NOTE — Patient Instructions (Signed)
Persistent Depressive Disorder  Persistent depressive disorder (PDD) is a mental health condition. PDD causes symptoms of low-level depression for 2 years or longer. It may also be called long-term (chronic) depression or dysthymia. PDD may include episodes of more severe depression that last for about 2 weeks (major depressive disorder or MDD). PDD can affect the way you think, feel, and sleep. This condition may also affect your relationships. You may be more likely to get sick if you have PDD. Symptoms of PDD occur for most of the day and may include:  Feeling tired (fatigue).  Low energy.  Eating too much or too little.  Sleeping too much or too little.  Feeling restless or agitated.  Feeling hopeless.  Feeling worthless or guilty.  Feeling worried or nervous (anxiety).  Trouble concentrating or making decisions.  Low self-esteem.  A negative way of looking at things (outlook).  Not being able to have fun or feel pleasure.  Avoiding interacting with people.  Getting angry or annoyed easily (irritability).  Acting aggressive or angry. Follow these instructions at home: Activity  Go back to your normal activities as told by your doctor.  Exercise regularly as told by your doctor. General instructions  Take over-the-counter and prescription medicines only as told by your doctor.  Do not drink alcohol. Or, limit how much alcohol you drink to no more than 1 drink a day for nonpregnant women and 2 drinks a day for men. One drink equals 12 oz of beer, 5 oz of wine, or 1 oz of hard liquor. Alcohol can affect any antidepressant medicines you are taking. Talk with your doctor about your alcohol use.  Eat a healthy diet and get plenty of sleep.  Find activities that you enjoy each day.  Consider joining a support group. Your doctor may be able to suggest a support group.  Keep all follow-up visits as told by your doctor. This is important. Where to find more  information National Alliance on Mental Illness  www.nami.org [place] National Institute of Mental Health  www.nimh.nih.gov National Suicide Prevention Lifeline  (1-800-273-8255).  This is free, 24-hour help. Contact a doctor if:  Your symptoms get worse.  You have new symptoms.  You have trouble sleeping or doing your daily activities. Get help right away if:  You self-harm.  You have serious thoughts about hurting yourself or others.  You see, hear, taste, smell, or feel things that are not there (hallucinate). This information is not intended to replace advice given to you by your health care provider. Make sure you discuss any questions you have with your health care provider. Document Released: 02/09/2015 Document Revised: 10/23/2015 Document Reviewed: 10/23/2015 Elsevier Interactive Patient Education  2019 Elsevier Inc.  

## 2018-08-13 NOTE — Progress Notes (Signed)
Virtual Visit via Video Note  I connected with Hinton DyerDonna S Veley on 08/13/18 at  8:45 AM EDT by a video enabled telemedicine application and verified that I am speaking with the correct person using two identifiers.  Location: Patient: Home Provider: Office   I discussed the limitations of evaluation and management by telemedicine and the availability of in person appointments. The patient expressed understanding and agreed to proceed.  History of Present Illness:  Pt reports worsening anxiety and depression. She reports over the last few months, she has been feeling more down, depressed and anxious. She reports this is being triggered by being at home all the time due to the pandemic. She is also having to care for her mother, who's health is declining and will be coming home soon from the hospital on hospice. She also has to care for elderly father. She feels overwhelmed. She reports intermittent chest discomfort which she attributes to anxiety. She is not seeing a therapist. She has tried Citalopram and Sertraline in the past, but feels like Sertraline was more effective. She denies SI/HI.   Observations/Objective:   Past Medical History:  Diagnosis Date  . Allergy   . Chicken pox   . Depression     Current Outpatient Medications  Medication Sig Dispense Refill  . clobetasol cream (TEMOVATE) 0.05 % Apply 1 application topically 2 (two) times daily. 30 g 2  . hydrOXYzine (VISTARIL) 25 MG capsule Take 1 capsule (25 mg total) by mouth 2 (two) times daily. 60 capsule 2  . lisinopril-hydrochlorothiazide (PRINZIDE,ZESTORETIC) 10-12.5 MG tablet     . metFORMIN (GLUCOPHAGE) 500 MG tablet Take 1 tablet by mouth 2 (two) times daily.    . pantoprazole (PROTONIX) 40 MG tablet Take 1 tablet (40 mg total) by mouth 2 (two) times daily before a meal. 60 tablet 2  . venlafaxine XR (EFFEXOR-XR) 75 MG 24 hr capsule Take 75 mg by mouth at bedtime.     . sertraline (ZOLOFT) 50 MG tablet Take 0.5 tablets (25  mg total) by mouth daily for 7 days, THEN 1 tablet (50 mg total) daily. 30 tablet 2   No current facility-administered medications for this visit.     No Known Allergies  Family History  Problem Relation Age of Onset  . Hypertension Mother   . Arthritis Mother   . Hyperlipidemia Mother   . Hypertension Father   . Arthritis Father   . Hyperlipidemia Father   . Diabetes Maternal Grandmother   . Heart disease Maternal Grandmother   . Arthritis Maternal Grandmother   . Hypertension Maternal Grandmother   . Cancer Maternal Grandfather   . Heart disease Maternal Grandfather   . Arthritis Maternal Grandfather   . Hypertension Maternal Grandfather   . Diabetes Paternal Grandmother   . Cancer Paternal Grandmother   . Arthritis Paternal Grandmother   . Hypertension Paternal Grandmother   . Diabetes Paternal Grandfather   . Heart disease Paternal Grandfather   . Arthritis Paternal Grandfather   . Hypertension Paternal Grandfather   . Cancer Maternal Uncle        Colon  . Breast cancer Paternal Aunt [phone]    Social History   Socioeconomic History  . Marital status: Married    Spouse name: Not on file  . Number of children: Not on file  . Years of education: Not on file  . Highest education level: Not on file  Occupational History  . Not on file  Social Needs  . Financial resource strain:  Not on file  . Food insecurity:    Worry: Not on file    Inability: Not on file  . Transportation needs:    Medical: Not on file    Non-medical: Not on file  Tobacco Use  . Smoking status: Never Smoker  . Smokeless tobacco: Never Used  Substance and Sexual Activity  . Alcohol use: No    Alcohol/week: 0.0 standard drinks  . Drug use: No  . Sexual activity: Yes  Lifestyle  . Physical activity:    Days per week: Not on file    Minutes per session: Not on file  . Stress: Not on file  Relationships  . Social connections:    Talks on phone: Not on file    Gets together: Not on file     Attends religious service: Not on file    Active member of club or organization: Not on file    Attends meetings of clubs or organizations: Not on file    Relationship status: Not on file  . Intimate partner violence:    Fear of current or ex partner: Not on file    Emotionally abused: Not on file    Physically abused: Not on file    Forced sexual activity: Not on file  Other Topics Concern  . Not on file  Social History Narrative  . Not on file     Constitutional: Denies fever, malaise, fatigue, headache or abrupt weight changes.  Respiratory: Denies difficulty breathing, shortness of breath, cough or sputum production.   Cardiovascular: Denies chest pain, chest tightness, palpitations or swelling in the hands or feet.  Psych: Pt reports anxiety and depression. Denies SI/HI.  No other specific complaints in a complete review of systems (except as listed in HPI above).  Assessment and Plan:  LMP 02/01/2016 Comment: irregular Wt Readings from Last 3 Encounters:  05/17/18 204 lb (92.5 kg)  11/22/17 204 lb (92.5 kg)  03/22/17 210 lb (95.3 kg)    General: Appears their stated age, well developed, well nourished in NAD. Skin: Warm, dry and intact.  Pulmonary/Chest: Normal effort. No respiratory distress.  Neurological: Alert and oriented.  Psychiatric: Mood and affect flat. Judgment and thought content normal.    Follow Up Instructions:    I discussed the assessment and treatment plan with the patient. The patient was provided an opportunity to ask questions and all were answered. The patient agreed with the plan and demonstrated an understanding of the instructions.   The patient was advised to call back or seek an in-person evaluation if the symptoms worsen or if the condition fails to improve as anticipated.     Nicki Reaper, NP

## 2018-09-11 ENCOUNTER — Telehealth: Payer: Self-pay

## 2018-09-11 ENCOUNTER — Other Ambulatory Visit: Payer: Self-pay | Admitting: Internal Medicine

## 2018-09-11 NOTE — Telephone Encounter (Signed)
I will see her then  

## 2018-09-11 NOTE — Telephone Encounter (Signed)
Since 09/03/18 pt has burning and pain upon urination with frequency and pressure and odor to urine. Pt thinks has fever and chills in evenings (not during the day)pt does not have thermometer, BP cuff or scales.no known exposure to covid but pt traveled to [facility] and [place] the week of 08/27/18. Pt did wear a mask but was around a large number of people at the mall. Pt has virtual appt with Dr Glori Bickers on 09/12/18 at 10 AM. Pt will bring urine specimen between 8-9 AM and leave on picnic table and will call [phone] to let lab know when specimen is on table.

## 2018-09-12 ENCOUNTER — Other Ambulatory Visit: Payer: BC Managed Care – PPO

## 2018-09-12 ENCOUNTER — Encounter: Payer: Self-pay | Admitting: Family Medicine

## 2018-09-12 ENCOUNTER — Other Ambulatory Visit: Payer: Self-pay

## 2018-09-12 ENCOUNTER — Ambulatory Visit (INDEPENDENT_AMBULATORY_CARE_PROVIDER_SITE_OTHER): Payer: BC Managed Care – PPO | Admitting: Family Medicine

## 2018-09-12 DIAGNOSIS — R300 Dysuria: Secondary | ICD-10-CM | POA: Diagnosis not present

## 2018-09-12 DIAGNOSIS — R350 Frequency of micturition: Secondary | ICD-10-CM | POA: Diagnosis not present

## 2018-09-12 DIAGNOSIS — N3000 Acute cystitis without hematuria: Secondary | ICD-10-CM | POA: Diagnosis not present

## 2018-09-12 DIAGNOSIS — N390 Urinary tract infection, site not specified: Secondary | ICD-10-CM | POA: Insufficient documentation

## 2018-09-12 LAB — POCT URINALYSIS DIPSTICK
Bilirubin, UA: NEGATIVE
Glucose, UA: NEGATIVE
Ketones, UA: NEGATIVE
Protein, UA: POSITIVE — AB
Spec Grav, UA: 1.015 (ref 1.010–1.025)
Urobilinogen, UA: 0.2 E.U./dL
pH, UA: 7.5 (ref 5.0–8.0)

## 2018-09-12 MED ORDER — SULFAMETHOXAZOLE-TRIMETHOPRIM 800-160 MG PO TABS: 1.0000 | ORAL_TABLET | Freq: Two times a day (BID) | ORAL | 0 refills | Status: DC

## 2018-09-12 NOTE — Patient Instructions (Signed)
Drink lots of fluids/water  Take the bactrim ds as directed Update if not starting to improve in a week or if worsening  Azo is ok for a few days  Watch for flank pain, fever or nausea   We will call with your culture result when it returns

## 2018-09-12 NOTE — Progress Notes (Signed)
Virtual Visit via Video Note  I connected with Breanna Day on 09/12/18 at 10:00 AM EDT by a video enabled telemedicine application and verified that I am speaking with the correct person using two identifiers.  Location: Patient: in her car Provider: office    I discussed the limitations of evaluation and management by telemedicine and the availability of in person appointments. The patient expressed understanding and agreed to proceed.  History of Present Illness: 53 yo pt of [name] here for urinary symptoms  She has h/o prediabetes taking metformin   Symptoms since last Monday  Frequency  Pressure to urinate  Feeling lousy  Some burning to urinate  No blood in urine  No flank pain   In evening feels a little feverish /tired -does not have a thermometer   She does get utis occasionally  None severe  Does not drink a lot of coffee or soda or tea     Azo - helped symptoms  Drinking lots of water  Does not always empty her bladder when very busy    UA: Pos for leuk/nit/blood Results for orders placed or performed in visit on 09/12/18  Urinalysis Dipstick  Result Value Ref Range   Color, UA yellow    Clarity, UA cloudy    Glucose, UA Negative Negative   Bilirubin, UA neg    Ketones, UA neg    Spec Grav, UA 1.015 1.010 - 1.025   Blood, UA trace    pH, UA 7.5 5.0 - 8.0   Protein, UA Positive (A) Negative   Urobilinogen, UA 0.2 0.2 or 1.0 E.U./dL   Nitrite, UA +    Leukocytes, UA Small (1+) (A) Negative   Appearance     Odor strong odor     Culture sent       Last uti was 3/18 with e coli  tx with septra No known drug allergies   Patient Active Problem List   Diagnosis Date Noted  . UTI (urinary tract infection) 09/12/2018  . Prediabetes 11/22/2017  . Gastroesophageal reflux disease without esophagitis 06/24/2014  . 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 Seasonal allergies 06/24/2014  . Anxiety and depression 06/24/2014   Past Medical History:  Diagnosis Date   . Allergy   . Chicken pox   . Depression    History reviewed. No pertinent surgical history. Social History   Tobacco Use  . Smoking status: Never Smoker  . Smokeless tobacco: Never Used  Substance Use Topics  . Alcohol use: No    Alcohol/week: 0.0 standard drinks  . Drug use: No   Family History  Problem Relation Age of Onset  . Hypertension Mother   . Arthritis Mother   . Hyperlipidemia Mother   . Hypertension Father   . Arthritis Father   . Hyperlipidemia Father   . Diabetes Maternal Grandmother   . Heart disease Maternal Grandmother   . Arthritis Maternal Grandmother   . Hypertension Maternal Grandmother   . Cancer Maternal Grandfather   . Heart disease Maternal Grandfather   . Arthritis Maternal Grandfather   . Hypertension Maternal Grandfather   . Diabetes Paternal Grandmother   . Cancer Paternal Grandmother   . Arthritis Paternal Grandmother   . Hypertension Paternal Grandmother   . Diabetes Paternal Grandfather   . Heart disease Paternal Grandfather   . Arthritis Paternal Grandfather   . Hypertension Paternal Grandfather   . Cancer Maternal Uncle        Colon  . Breast cancer Paternal Aunt  49   No Known Allergies Current Outpatient Medications on File Prior to Visit  Medication Sig Dispense Refill  . clobetasol cream (TEMOVATE) 0.05 % Apply 1 application topically 2 (two) times daily. 30 g 2  . hydrOXYzine (VISTARIL) 25 MG capsule Take 1 capsule (25 mg total) by mouth daily as needed. 30 capsule 0  . lisinopril-hydrochlorothiazide (PRINZIDE,ZESTORETIC) 10-12.5 MG tablet     . metFORMIN (GLUCOPHAGE) 500 MG tablet Take 1 tablet by mouth 2 (two) times daily.    . pantoprazole (PROTONIX) 40 MG tablet Take 1 tablet (40 mg total) by mouth 2 (two) times daily before a meal. 60 tablet 2  . venlafaxine XR (EFFEXOR XR) 150 MG 24 hr capsule Take 1 capsule (150 mg total) by mouth daily with breakfast. 90 capsule 1   No current facility-administered medications on  file prior to visit.    Review of Systems  Constitutional: Positive for malaise/fatigue. Negative for chills, fever and weight loss.  Eyes: Negative for discharge and redness.  Respiratory: Negative for cough and shortness of breath.   Cardiovascular: Negative for chest pain.  Gastrointestinal: Negative for heartburn, nausea and vomiting.  Genitourinary: Positive for dysuria, frequency and urgency. Negative for flank pain and hematuria.  Skin: Negative for rash.  Neurological: Negative for dizziness.  Endo/Heme/Allergies: Negative for polydipsia.      Observations/Objective: Patient appears well, in no distress Weight is baseline  No facial swelling or asymmetry Normal voice-not hoarse and no slurred speech No obvious tremor or mobility impairment Moving neck and UEs normally Able to hear the call well  No cough or shortness of breath during interview  Talkative and mentally sharp with no cognitive changes No skin changes on face or neck , no rash or pallor Affect is normal    Assessment and Plan: Problem List Items Addressed This Visit      Genitourinary   UTI (urinary tract infection) - Primary    Uncomplicated Enc fluids (azo for 2 d is ok if needed) Px bactrim ds to use bid for 5 d Urine cx pending  Watch for worse symptoms/flank pain/ hematuria Update if not starting to improve in a week or if worsening  Will reach out when culture result returns       Relevant Medications   sulfamethoxazole-trimethoprim (BACTRIM DS) 800-160 MG tablet   Other Relevant Orders   Urine Culture    Other Visit Diagnoses    Frequency of urination       Relevant Orders   Urinalysis Dipstick (Completed)   Burning with urination       Relevant Orders   Urinalysis Dipstick (Completed)       Follow Up Instructions: Drink lots of fluids/water  Take the bactrim ds as directed Update if not starting to improve in a week or if worsening  Azo is ok for a few days  Watch for flank  pain, fever or nausea   We will call with your culture result when it returns     I discussed the assessment and treatment plan with the patient. The patient was provided an opportunity to ask questions and all were answered. The patient agreed with the plan and demonstrated an understanding of the instructions.   The patient was advised to call back or seek an in-person evaluation if the symptoms worsen or if the condition fails to improve as anticipated.   Roxy MannsMarne Averill Winters, MD

## 2018-09-12 NOTE — Assessment & Plan Note (Signed)
Uncomplicated Enc fluids (azo for 2 d is ok if needed) Px bactrim ds to use bid for 5 d Urine cx pending  Watch for worse symptoms/flank pain/ hematuria Update if not starting to improve in a week or if worsening  Will reach out when culture result returns

## 2018-09-14 LAB — URINE CULTURE
MICRO NUMBER:: 626052
SPECIMEN QUALITY:: ADEQUATE

## 2018-10-03 ENCOUNTER — Telehealth: Payer: Self-pay | Admitting: Internal Medicine

## 2018-10-03 NOTE — Telephone Encounter (Signed)
Left message asking pt to call office Please let pt know paperwork has been faxed and there is a copy here for pt   Paperwork faxed Copy for pt Copy for scan

## 2018-10-03 NOTE — Telephone Encounter (Signed)
Spoke with pt she stated this was bereavement  Mother passed away  Paperwork in [facility] in box Pt filled out most of paperwork her self

## 2018-10-03 NOTE — Telephone Encounter (Signed)
Signed. Placed in my outbox 

## 2018-10-03 NOTE — Telephone Encounter (Signed)
Left message asking pt to call office  Regarding FMLA What is the reason for the leave.

## 2018-10-04 NOTE — Telephone Encounter (Signed)
Pt called back wanting to change why she was needing leave from bereavement death of mom to emotional stress.  She stated state won't accept breavement for reason for leave.  Please inital and date change

## 2018-10-05 NOTE — Telephone Encounter (Signed)
Done, in my outbox

## 2018-10-10 NOTE — Telephone Encounter (Signed)
Paperwork refaxed 7/24

## 2018-10-10 NOTE — Telephone Encounter (Signed)
Paperwork refa

## 2018-10-12 NOTE — Telephone Encounter (Signed)
Left message asking pt to call office Please let pt know her paperwork faxed on 7/24 and there is a copy here for her if she wants it  Copy for pt Copy for scan

## 2018-11-05 ENCOUNTER — Other Ambulatory Visit: Payer: Self-pay | Admitting: Internal Medicine

## 2019-01-01 ENCOUNTER — Other Ambulatory Visit: Payer: Self-pay

## 2019-01-01 ENCOUNTER — Encounter: Payer: Self-pay | Admitting: Internal Medicine

## 2019-01-01 ENCOUNTER — Ambulatory Visit: Payer: BC Managed Care – PPO | Admitting: Internal Medicine

## 2019-01-01 VITALS — BP 128/82 | HR 82 | Temp 97.7°F | Wt 209.0 lb

## 2019-01-01 DIAGNOSIS — R3989 Other symptoms and signs involving the genitourinary system: Secondary | ICD-10-CM

## 2019-01-01 DIAGNOSIS — R109 Unspecified abdominal pain: Secondary | ICD-10-CM

## 2019-01-01 DIAGNOSIS — R3915 Urgency of urination: Secondary | ICD-10-CM | POA: Diagnosis not present

## 2019-01-01 DIAGNOSIS — R300 Dysuria: Secondary | ICD-10-CM

## 2019-01-01 LAB — POC URINALSYSI DIPSTICK (AUTOMATED)
Bilirubin, UA: NEGATIVE
Glucose, UA: NEGATIVE
Ketones, UA: NEGATIVE
Nitrite, UA: POSITIVE
Protein, UA: NEGATIVE
Spec Grav, UA: 1.01 (ref 1.010–1.025)
Urobilinogen, UA: 0.2 E.U./dL
pH, UA: 7 (ref 5.0–8.0)

## 2019-01-01 MED ORDER — NITROFURANTOIN MONOHYD MACRO 100 MG PO CAPS: 100.0000 mg | ORAL_CAPSULE | Freq: Two times a day (BID) | ORAL | 0 refills | Status: DC

## 2019-01-01 NOTE — Addendum Note (Signed)
Addended by: Sanay Belmar Y on: 01/01/2019 01:26 PM   Modules accepted: Orders  

## 2019-01-01 NOTE — Patient Instructions (Signed)
Urinary Tract Infection, Adult A urinary tract infection (UTI) is an infection of any part of the urinary tract. The urinary tract includes:  The kidneys.  The ureters.  The bladder.  The urethra. These organs make, store, and get rid of pee (urine) in the body. What are the causes? This is caused by germs (bacteria) in your genital area. These germs grow and cause swelling (inflammation) of your urinary tract. What increases the risk? You are more likely to develop this condition if:  You have a small, thin tube (catheter) to drain pee.  You cannot control when you pee or poop (incontinence).  You are female, and: ? You use these methods to prevent pregnancy: ? A medicine that kills sperm (spermicide). ? A device that blocks sperm (diaphragm). ? You have low levels of a female hormone (estrogen). ? You are pregnant.  You have genes that add to your risk.  You are sexually active.  You take antibiotic medicines.  You have trouble peeing because of: ? A prostate that is bigger than normal, if you are female. ? A blockage in the part of your body that drains pee from the bladder (urethra). ? A kidney stone. ? A nerve condition that affects your bladder (neurogenic bladder). ? Not getting enough to drink. ? Not peeing often enough.  You have other conditions, such as: ? Diabetes. ? A weak disease-fighting system (immune system). ? Sickle cell disease. ? Gout. ? Injury of the spine. What are the signs or symptoms? Symptoms of this condition include:  Needing to pee right away (urgently).  Peeing often.  Peeing small amounts often.  Pain or burning when peeing.  Blood in the pee.  Pee that smells bad or not like normal.  Trouble peeing.  Pee that is cloudy.  Fluid coming from the vagina, if you are female.  Pain in the belly or lower back. Other symptoms include:  Throwing up (vomiting).  No urge to eat.  Feeling mixed up (confused).  Being tired  and grouchy (irritable).  A fever.  Watery poop (diarrhea). How is this treated? This condition may be treated with:  Antibiotic medicine.  Other medicines.  Drinking enough water. Follow these instructions at home:  Medicines  Take over-the-counter and prescription medicines only as told by your doctor.  If you were prescribed an antibiotic medicine, take it as told by your doctor. Do not stop taking it even if you start to feel better. General instructions  Make sure you: ? Pee until your bladder is empty. ? Do not hold pee for a long time. ? Empty your bladder after sex. ? Wipe from front to back after pooping if you are a female. Use each tissue one time when you wipe.  Drink enough fluid to keep your pee pale yellow.  Keep all follow-up visits as told by your doctor. This is important. Contact a doctor if:  You do not get better after 1-2 days.  Your symptoms go away and then come back. Get help right away if:  You have very bad back pain.  You have very bad pain in your lower belly.  You have a fever.  You are sick to your stomach (nauseous).  You are throwing up. Summary  A urinary tract infection (UTI) is an infection of any part of the urinary tract.  This condition is caused by germs in your genital area.  There are many risk factors for a UTI. These include having a small, thin   tube to drain pee and not being able to control when you pee or poop.  Treatment includes antibiotic medicines for germs.  Drink enough fluid to keep your pee pale yellow. This information is not intended to replace advice given to you by your health care provider. Make sure you discuss any questions you have with your health care provider. Document Released: 08/17/2007 Document Revised: 02/15/2018 Document Reviewed: 09/07/2017 Elsevier Patient Education  2020 Elsevier Inc.  

## 2019-01-01 NOTE — Progress Notes (Signed)
HPI  Pt presents to the clinic today with c/o urgency, dysuria, bladder pressure and bilateral flank. She reports this started 1 week ago, but worsened yesterday. She denies frequency or blood in her urine. She denies vaginal discharge, odor, itching, irritation or abnormal bleeding. She denies fever, chills or nausea. She is postmenopausal. She has a family history of kidney stones but no personal history of that or ovarian cyst. She has tried AZO with some relief. She reports history of postcoital UTI's and wondering if there is something she can take to prevent this from happening.    Review of Systems  Past Medical History:  Diagnosis Date  . Allergy   . Chicken pox   . Depression     Family History  Problem Relation Age of Onset  . Hypertension Mother   . Arthritis Mother   . Hyperlipidemia Mother   . Hypertension Father   . Arthritis Father   . Hyperlipidemia Father   . Diabetes Maternal Grandmother   . Heart disease Maternal Grandmother   . Arthritis Maternal Grandmother   . Hypertension Maternal Grandmother   . Cancer Maternal Grandfather   . Heart disease Maternal Grandfather   . Arthritis Maternal Grandfather   . Hypertension Maternal Grandfather   . Diabetes Paternal Grandmother   . Cancer Paternal Grandmother   . Arthritis Paternal Grandmother   . Hypertension Paternal Grandmother   . Diabetes Paternal Grandfather   . Heart disease Paternal Grandfather   . Arthritis Paternal Grandfather   . Hypertension Paternal Grandfather   . Cancer Maternal Uncle        Colon  . Breast cancer Paternal Aunt 85    Social History   Socioeconomic History  . Marital status: Married    Spouse name: Not on file  . Number of children: Not on file  . Years of education: Not on file  . Highest education level: Not on file  Occupational History  . Not on file  Social Needs  . Financial resource strain: Not on file  . Food insecurity    Worry: Not on file    Inability: Not  on file  . Transportation needs    Medical: Not on file    Non-medical: Not on file  Tobacco Use  . Smoking status: Never Smoker  . Smokeless tobacco: Never Used  Substance and Sexual Activity  . Alcohol use: No    Alcohol/week: 0.0 standard drinks  . Drug use: No  . Sexual activity: Yes  Lifestyle  . Physical activity    Days per week: Not on file    Minutes per session: Not on file  . Stress: Not on file  Relationships  . Social Herbalist on phone: Not on file    Gets together: Not on file    Attends religious service: Not on file    Active member of club or organization: Not on file    Attends meetings of clubs or organizations: Not on file    Relationship status: Not on file  . Intimate partner violence    Fear of current or ex partner: Not on file    Emotionally abused: Not on file    Physically abused: Not on file    Forced sexual activity: Not on file  Other Topics Concern  . Not on file  Social History Narrative  . Not on file    No Known Allergies   Constitutional: Denies fever, malaise, fatigue, headache or abrupt weight changes.  GU: Pt reports urgency, pain with urination, bladder pressure and bilateral flank pain. Denies frequency,  burning sensation, blood in urine, odor or discharge. Skin: Denies redness, rashes, lesions or ulcercations.   No other specific complaints in a complete review of systems (except as listed in HPI above).    Objective:   Physical Exam  BP 128/82   Pulse 82   Temp 97.7 F (36.5 C) (Temporal)   Wt 209 lb (94.8 kg)   LMP 02/01/2016 Comment: irregular  SpO2 98%   BMI 37.62 kg/m  Wt Readings from Last 3 Encounters:  01/01/19 209 lb (94.8 kg)  05/17/18 204 lb (92.5 kg)  11/22/17 204 lb (92.5 kg)    General: Appears her stated age, obese, in NAD. Cardiovascular: Normal rate and rhythm. S1,S2 noted.   Pulmonary/Chest: Normal effort and positive vesicular breath sounds. No respiratory distress. No wheezes,  rales or ronchi noted.  Abdomen: Soft. Normal bowel sounds. No distention or masses noted.  Tender to palpation over the bladder area. No CVA tenderness.        Assessment & Plan:   Urgency,  Dysuria Bladder Pressure, and Bilateral Flank Pain secondary to Presumed UTI:  Urinalysis: 1+ leuks, pos nitrites, trace blood Will send urine culture eRx sent if for Macrobid 100 mg BID x 5 days OK to take AZO OTC Drink plenty of fluids Consider Trimethoprim 100 mg PO post coital for UTI prevention, pending culture  RTC as needed or if symptoms persist. Nicki Reaper, NP

## 2019-01-03 LAB — URINE CULTURE
MICRO NUMBER:: 1009274
SPECIMEN QUALITY:: ADEQUATE

## 2019-01-15 ENCOUNTER — Ambulatory Visit: Payer: BC Managed Care – PPO | Admitting: Internal Medicine

## 2019-01-15 ENCOUNTER — Encounter: Payer: Self-pay | Admitting: Internal Medicine

## 2019-01-15 ENCOUNTER — Other Ambulatory Visit: Payer: Self-pay

## 2019-01-15 VITALS — BP 124/84 | HR 92 | Temp 97.9°F | Wt 212.0 lb

## 2019-01-15 DIAGNOSIS — R300 Dysuria: Secondary | ICD-10-CM | POA: Diagnosis not present

## 2019-01-15 DIAGNOSIS — E119 Type 2 diabetes mellitus without complications: Secondary | ICD-10-CM | POA: Diagnosis not present

## 2019-01-15 DIAGNOSIS — R3915 Urgency of urination: Secondary | ICD-10-CM

## 2019-01-15 LAB — POC URINALSYSI DIPSTICK (AUTOMATED)
Bilirubin, UA: NEGATIVE
Glucose, UA: POSITIVE — AB
Ketones, UA: NEGATIVE
Leukocytes, UA: NEGATIVE
Nitrite, UA: NEGATIVE
Protein, UA: NEGATIVE
Spec Grav, UA: 1.015 (ref 1.010–1.025)
Urobilinogen, UA: 0.2 E.U./dL
pH, UA: 6 (ref 5.0–8.0)

## 2019-01-15 LAB — COMPREHENSIVE METABOLIC PANEL
ALT: 26 U/L (ref 0–35)
AST: 20 U/L (ref 0–37)
Albumin: 4.1 g/dL (ref 3.5–5.2)
Alkaline Phosphatase: 90 U/L (ref 39–117)
BUN: 14 mg/dL (ref 6–23)
CO2: 30 mEq/L (ref 19–32)
Calcium: 9.1 mg/dL (ref 8.4–10.5)
Chloride: 99 mEq/L (ref 96–112)
Creatinine, Ser: 0.55 mg/dL (ref 0.40–1.20)
GFR: 115.36 mL/min (ref >60.00)
Glucose, Bld: 197 mg/dL — ABNORMAL HIGH (ref 70–99)
Potassium: 4.8 mEq/L (ref 3.5–5.1)
Sodium: 134 mEq/L — ABNORMAL LOW (ref 135–145)
Total Bilirubin: 0.3 mg/dL (ref 0.2–1.2)
Total Protein: 6.7 g/dL (ref 6.0–8.3)

## 2019-01-15 LAB — LIPID PANEL
Cholesterol: 220 mg/dL — ABNORMAL HIGH (ref 0–200)
HDL: 48.7 mg/dL (ref >39.00)
NonHDL: 170.94
Total CHOL/HDL Ratio: 5
Triglycerides: 212 mg/dL — ABNORMAL HIGH (ref 0.0–149.0)
VLDL: 42.4 mg/dL — ABNORMAL HIGH (ref 0.0–40.0)

## 2019-01-15 LAB — LDL CHOLESTEROL, DIRECT: Direct LDL: 150 mg/dL

## 2019-01-15 MED ORDER — SULFAMETHOXAZOLE-TRIMETHOPRIM 400-80 MG PO TABS: 1.0000 | ORAL_TABLET | Freq: Two times a day (BID) | ORAL | 0 refills | Status: DC

## 2019-01-15 NOTE — Patient Instructions (Signed)
Urinary Frequency, Adult Urinary frequency means urinating more often than usual. You may urinate every 1-2 hours even though you drink a normal amount of fluid and do not have a bladder infection or condition. Although you urinate more often than normal, the total amount of urine produced in a day is normal. With urinary frequency, you may have an urgent need to urinate often. The stress and anxiety of needing to find a bathroom quickly can make this urge worse. This condition may go away on its own or you may need treatment at home. Home treatment may include bladder training, exercises, taking medicines, or making changes to your diet. Follow these instructions at home: Bladder health   Keep a bladder diary if told by your health care provider. Keep track of: ? What you eat and drink. ? How often you urinate. ? How much you urinate.  Follow a bladder training program if told by your health care provider. This may include: ? Learning to delay going to the bathroom. ? Double urinating (voiding). This helps if you are not completely emptying your bladder. ? Scheduled voiding.  Do Kegel exercises as told by your health care provider. Kegel exercises strengthen the muscles that help control urination, which may help the condition. Eating and drinking  If told by your health care provider, make diet changes, such as: ? Avoiding caffeine. ? Drinking fewer fluids, especially alcohol. ? Not drinking in the evening. ? Avoiding foods or drinks that may irritate the bladder. These include coffee, tea, soda, artificial sweeteners, citrus, tomato-based foods, and chocolate. ? Eating foods that help prevent or ease constipation. Constipation can make this condition worse. Your health care provider may recommend that you:  Drink enough fluid to keep your urine pale yellow.  Take over-the-counter or prescription medicines.  Eat foods that are high in fiber, such as beans, whole grains, and fresh  fruits and vegetables.  Limit foods that are high in fat and processed sugars, such as fried or sweet foods. General instructions  Take over-the-counter and prescription medicines only as told by your health care provider.  Keep all follow-up visits as told by your health care provider. This is important. Contact a health care provider if:  You start urinating more often.  You feel pain or irritation when you urinate.  You notice blood in your urine.  Your urine looks cloudy.  You develop a fever.  You begin vomiting. Get help right away if:  You are unable to urinate. Summary  Urinary frequency means urinating more often than usual. With urinary frequency, you may urinate every 1-2 hours even though you drink a normal amount of fluid and do not have a bladder infection or other bladder condition.  Your health care provider may recommend that you keep a bladder diary, follow a bladder training program, or make dietary changes.  If told by your health care provider, do Kegel exercises to strengthen the muscles that help control urination.  Take over-the-counter and prescription medicines only as told by your health care provider.  Contact a health care provider if your symptoms do not improve or get worse. This information is not intended to replace advice given to you by your health care provider. Make sure you discuss any questions you have with your health care provider. Document Released: 12/25/2008 Document Revised: 09/07/2017 Document Reviewed: 09/07/2017 Elsevier Patient Education  2020 [employer]  

## 2019-01-15 NOTE — Progress Notes (Signed)
HPI  Pt presents to the clinic today with c/o urinary urgency and dysuria. She came previously for similar symptoms an was prescribed Macrobid and took for 5 days as prescribed. It helped initally but now symptoms have returned. Her urine culture at that time was negative. She denies frequency, fever, chills, nausea or low back pain. She denies vaginal complaints.  Of note, she has a history of DM 2, but has not had her A1C checked in 2 years. She does not check her sugars.   Review of Systems  Past Medical History:  Diagnosis Date  . Allergy   . Chicken pox   . Depression     Family History  Problem Relation Age of Onset  . Hypertension Mother   . Arthritis Mother   . Hyperlipidemia Mother   . Hypertension Father   . Arthritis Father   . Hyperlipidemia Father   . Diabetes Maternal Grandmother   . Heart disease Maternal Grandmother   . Arthritis Maternal Grandmother   . Hypertension Maternal Grandmother   . Cancer Maternal Grandfather   . Heart disease Maternal Grandfather   . Arthritis Maternal Grandfather   . Hypertension Maternal Grandfather   . Diabetes Paternal Grandmother   . Cancer Paternal Grandmother   . Arthritis Paternal Grandmother   . Hypertension Paternal Grandmother   . Diabetes Paternal Grandfather   . Heart disease Paternal Grandfather   . Arthritis Paternal Grandfather   . Hypertension Paternal Grandfather   . Cancer Maternal Uncle        Colon  . Breast cancer Paternal Aunt 85    Social History   Socioeconomic History  . Marital status: Married    Spouse name: Not on file  . Number of children: Not on file  . Years of education: Not on file  . Highest education level: Not on file  Occupational History  . Not on file  Social Needs  . Financial resource strain: Not on file  . Food insecurity    Worry: Not on file    Inability: Not on file  . Transportation needs    Medical: Not on file    Non-medical: Not on file  Tobacco Use  . Smoking  status: Never Smoker  . Smokeless tobacco: Never Used  Substance and Sexual Activity  . Alcohol use: No    Alcohol/week: 0.0 standard drinks  . Drug use: No  . Sexual activity: Yes  Lifestyle  . Physical activity    Days per week: Not on file    Minutes per session: Not on file  . Stress: Not on file  Relationships  . Social Herbalist on phone: Not on file    Gets together: Not on file    Attends religious service: Not on file    Active member of club or organization: Not on file    Attends meetings of clubs or organizations: Not on file    Relationship status: Not on file  . Intimate partner violence    Fear of current or ex partner: Not on file    Emotionally abused: Not on file    Physically abused: Not on file    Forced sexual activity: Not on file  Other Topics Concern  . Not on file  Social History Narrative  . Not on file    No Known Allergies   Constitutional: Denies fever, malaise, fatigue, headache or abrupt weight changes.   GU: Pt reports urgencyand pain with urination. Denies burning sensation,  blood in urine, odor or discharge. Skin: Denies redness, rashes, lesions or ulcercations.   No other specific complaints in a complete review of systems (except as listed in HPI above).    Objective:   Physical Exam  BP 124/84   Pulse 92   Temp 97.9 F (36.6 C) (Temporal)   Wt 212 lb (96.2 kg)   LMP 02/01/2016 Comment: irregular  BMI 38.16 kg/m   Wt Readings from Last 3 Encounters:  01/01/19 209 lb (94.8 kg)  05/17/18 204 lb (92.5 kg)  11/22/17 204 lb (92.5 kg)    General: Appears her stated age, obese, in NAD. Cardiovascular: Normal rate and rhythm. S1,S2 noted.   Pulmonary/Chest: Normal effort and positive vesicular breath sounds. No respiratory distress. No wheezes, rales or ronchi noted.  Abdomen: Soft. Normal bowel sounds. No distention or masses noted.  Tender to palpation over the bladder area. No CVA tenderness.        Assessment  & Plan:   Urgency, Dysuria:  Could be due to uncontrolled diabetes instead of bacteria Urinalysis: trace blood eRx sent if for Septra  mg BID x 5 days OK to take AZO OTC Drink plenty of fluids  DM 2:  Will check CMET, Lipid and A1C today Encouraged low carb diet and exercise for weight loss   RTC as needed or if symptoms persist. Nicki Reaper, NP

## 2019-01-16 LAB — HEMOGLOBIN A1C: Hgb A1c MFr Bld: 9.2 % — ABNORMAL HIGH (ref 4.6–6.5)

## 2019-02-04 ENCOUNTER — Other Ambulatory Visit: Payer: Self-pay | Admitting: Internal Medicine

## 2019-02-19 ENCOUNTER — Other Ambulatory Visit: Payer: Self-pay

## 2019-02-19 DIAGNOSIS — Z20822 Contact with and (suspected) exposure to covid-19: Secondary | ICD-10-CM

## 2019-02-22 MED ORDER — ATORVASTATIN CALCIUM 10 MG PO TABS: 10.0000 mg | ORAL_TABLET | Freq: Every day | ORAL | 2 refills | Status: DC

## 2019-02-22 MED ORDER — METFORMIN HCL 500 MG PO TABS: 500.0000 mg | ORAL_TABLET | Freq: Two times a day (BID) | ORAL | 0 refills | Status: DC

## 2019-02-22 NOTE — Addendum Note (Signed)
Addended by: Lurlean Nanny on: 02/22/2019 12:19 PM   Modules accepted: Orders

## 2019-02-23 LAB — NOVEL CORONAVIRUS, NAA: SARS-CoV-2, NAA: DETECTED — AB

## 2019-04-15 ENCOUNTER — Telehealth: Payer: Self-pay | Admitting: Internal Medicine

## 2019-04-15 NOTE — Telephone Encounter (Signed)
She was never seen for this. I cannot provide a work note for something I never saw her for.

## 2019-04-15 NOTE — Telephone Encounter (Signed)
Pt called needing work note to release her back to work from [employer]  Last day of quarantine 12/18 she returned to work 12/21

## 2019-04-22 NOTE — Telephone Encounter (Signed)
MyChart message sent to patient.

## 2019-04-26 ENCOUNTER — Encounter: Payer: Self-pay | Admitting: Internal Medicine

## 2019-04-26 DIAGNOSIS — K219 Gastro-esophageal reflux disease without esophagitis: Secondary | ICD-10-CM

## 2019-04-29 MED ORDER — PANTOPRAZOLE SODIUM 40 MG PO TBEC: 40.0000 mg | DELAYED_RELEASE_TABLET | Freq: Two times a day (BID) | ORAL | 2 refills | Status: DC

## 2019-04-29 MED ORDER — VENLAFAXINE HCL ER 150 MG PO CP24: 150.0000 mg | ORAL_CAPSULE | Freq: Every day | ORAL | 0 refills | Status: DC

## 2019-04-29 MED ORDER — LISINOPRIL-HYDROCHLOROTHIAZIDE 10-12.5 MG PO TABS: 1.0000 | ORAL_TABLET | Freq: Every day | ORAL | 0 refills | Status: DC

## 2019-04-29 MED ORDER — METFORMIN HCL 500 MG PO TABS: 500.0000 mg | ORAL_TABLET | Freq: Two times a day (BID) | ORAL | 0 refills | Status: DC

## 2019-04-29 NOTE — Addendum Note (Signed)
Addended by: Roena Malady on: 04/29/2019 09:14 AM   Modules accepted: Orders

## 2019-05-03 ENCOUNTER — Ambulatory Visit: Payer: BC Managed Care – PPO | Admitting: Internal Medicine

## 2019-05-13 ENCOUNTER — Encounter: Payer: Self-pay | Admitting: *Deleted

## 2019-05-13 ENCOUNTER — Other Ambulatory Visit: Payer: Self-pay

## 2019-05-13 ENCOUNTER — Emergency Department: Payer: BC Managed Care – PPO

## 2019-05-13 ENCOUNTER — Inpatient Hospital Stay
Admission: EM | Admit: 2019-05-13 | Discharge: 2019-05-15 | DRG: 690 | Disposition: A | Discharge status: Home / Self Care | Payer: BC Managed Care – PPO | Attending: Internal Medicine | Admitting: Internal Medicine

## 2019-05-13 DIAGNOSIS — Z66 Do not resuscitate: Secondary | ICD-10-CM | POA: Diagnosis present

## 2019-05-13 DIAGNOSIS — E119 Type 2 diabetes mellitus without complications: Secondary | ICD-10-CM

## 2019-05-13 DIAGNOSIS — Z7982 Long term (current) use of aspirin: Secondary | ICD-10-CM

## 2019-05-13 DIAGNOSIS — F329 Major depressive disorder, single episode, unspecified: Secondary | ICD-10-CM | POA: Diagnosis present

## 2019-05-13 DIAGNOSIS — Z833 Family history of diabetes mellitus: Secondary | ICD-10-CM

## 2019-05-13 DIAGNOSIS — I10 Essential (primary) hypertension: Secondary | ICD-10-CM | POA: Diagnosis present

## 2019-05-13 DIAGNOSIS — F419 Anxiety disorder, unspecified: Secondary | ICD-10-CM | POA: Diagnosis present

## 2019-05-13 DIAGNOSIS — A419 Sepsis, unspecified organism: Secondary | ICD-10-CM | POA: Diagnosis not present

## 2019-05-13 DIAGNOSIS — K219 Gastro-esophageal reflux disease without esophagitis: Secondary | ICD-10-CM | POA: Diagnosis present

## 2019-05-13 DIAGNOSIS — Z87440 Personal history of urinary (tract) infections: Secondary | ICD-10-CM | POA: Diagnosis not present

## 2019-05-13 DIAGNOSIS — Z7984 Long term (current) use of oral hypoglycemic drugs: Secondary | ICD-10-CM

## 2019-05-13 DIAGNOSIS — Z79899 Other long term (current) drug therapy: Secondary | ICD-10-CM | POA: Diagnosis not present

## 2019-05-13 DIAGNOSIS — F411 Generalized anxiety disorder: Secondary | ICD-10-CM | POA: Diagnosis present

## 2019-05-13 DIAGNOSIS — N12 Tubulo-interstitial nephritis, not specified as acute or chronic: Principal | ICD-10-CM | POA: Diagnosis present

## 2019-05-13 HISTORY — DX: Essential (primary) hypertension: I10

## 2019-05-13 HISTORY — DX: Type 2 diabetes mellitus without complications: E11.9

## 2019-05-13 LAB — CBC
HCT: 38.8 % (ref 36.0–46.0)
Hemoglobin: 12.8 g/dL (ref 12.0–15.0)
MCH: 28.1 pg (ref 26.0–34.0)
MCHC: 33 g/dL (ref 30.0–36.0)
MCV: 85.1 fL (ref 80.0–100.0)
Platelets: 348 10*3/uL (ref 150–400)
RBC: 4.56 MIL/uL (ref 3.87–5.11)
RDW: 12.7 % (ref 11.5–15.5)
WBC: 16.9 10*3/uL — ABNORMAL HIGH (ref 4.0–10.5)
nRBC: 0 % (ref 0.0–0.2)

## 2019-05-13 LAB — URINALYSIS, COMPLETE (UACMP) WITH MICROSCOPIC
Bilirubin Urine: NEGATIVE
Glucose, UA: NEGATIVE mg/dL
Ketones, ur: 5 mg/dL — AB
Nitrite: NEGATIVE
Protein, ur: 100 mg/dL — AB
Specific Gravity, Urine: 1.02 (ref 1.005–1.030)
WBC, UA: >50 WBC/hpf — ABNORMAL HIGH (ref 0–5)
pH: 5 (ref 5.0–8.0)

## 2019-05-13 LAB — COMPREHENSIVE METABOLIC PANEL
ALT: 24 U/L (ref 0–44)
AST: 18 U/L (ref 15–41)
Albumin: 3.9 g/dL (ref 3.5–5.0)
Alkaline Phosphatase: 82 U/L (ref 38–126)
Anion gap: 9 (ref 5–15)
BUN: 12 mg/dL (ref 6–20)
CO2: 25 mmol/L (ref 22–32)
Calcium: 8.6 mg/dL — ABNORMAL LOW (ref 8.9–10.3)
Chloride: 99 mmol/L (ref 98–111)
Creatinine, Ser: 0.69 mg/dL (ref 0.44–1.00)
GFR calc Af Amer: >60 mL/min (ref >60)
GFR calc non Af Amer: >60 mL/min (ref >60)
Glucose, Bld: 161 mg/dL — ABNORMAL HIGH (ref 70–99)
Potassium: 4.3 mmol/L (ref 3.5–5.1)
Sodium: 133 mmol/L — ABNORMAL LOW (ref 135–145)
Total Bilirubin: 0.6 mg/dL (ref 0.3–1.2)
Total Protein: 7.7 g/dL (ref 6.5–8.1)

## 2019-05-13 LAB — GLUCOSE, CAPILLARY: Glucose-Capillary: 124 mg/dL — ABNORMAL HIGH (ref 70–99)

## 2019-05-13 MED ORDER — PANTOPRAZOLE SODIUM 40 MG PO TBEC
40.0000 mg | DELAYED_RELEASE_TABLET | Freq: Two times a day (BID) | ORAL | Status: DC
  Administered 2019-05-14 – 2019-05-15 (×3): 40 mg via ORAL
  Filled 2019-05-13 (×3): qty 1

## 2019-05-13 MED ORDER — VENLAFAXINE HCL ER 150 MG PO CP24
150.0000 mg | ORAL_CAPSULE | Freq: Every day | ORAL | Status: DC
  Administered 2019-05-14 – 2019-05-15 (×2): 150 mg via ORAL
  Filled 2019-05-13: qty 1
  Filled 2019-05-13: qty 2
  Filled 2019-05-13: qty 1
  Filled 2019-05-13: qty 2

## 2019-05-13 MED ORDER — ONDANSETRON HCL 4 MG/2ML IJ SOLN
4.0000 mg | Freq: Once | INTRAMUSCULAR | Status: AC
  Administered 2019-05-13: 18:00:00 4 mg via INTRAVENOUS
  Filled 2019-05-13: qty 2

## 2019-05-13 MED ORDER — ACETAMINOPHEN 325 MG PO TABS
650.0000 mg | ORAL_TABLET | Freq: Four times a day (QID) | ORAL | Status: DC | PRN
  Administered 2019-05-14 (×2): 650 mg via ORAL
  Filled 2019-05-13 (×2): qty 2

## 2019-05-13 MED ORDER — INSULIN ASPART 100 UNIT/ML ~~LOC~~ SOLN
0.0000 [IU] | Freq: Three times a day (TID) | SUBCUTANEOUS | Status: DC
  Administered 2019-05-14 (×3): 2 [IU] via SUBCUTANEOUS
  Administered 2019-05-15: 1 [IU] via SUBCUTANEOUS
  Filled 2019-05-13 (×5): qty 1

## 2019-05-13 MED ORDER — ACETAMINOPHEN 500 MG PO TABS
ORAL_TABLET | ORAL | Status: AC
  Filled 2019-05-13: qty 2

## 2019-05-13 MED ORDER — SODIUM CHLORIDE 0.9 % IV SOLN
2.0000 g | INTRAVENOUS | Status: DC
  Administered 2019-05-14 (×2): 2 g via INTRAVENOUS
  Filled 2019-05-13: qty 20
  Filled 2019-05-13: qty 2
  Filled 2019-05-13: qty 20

## 2019-05-13 MED ORDER — ONDANSETRON HCL 4 MG/2ML IJ SOLN: 4.0000 mg | Freq: Four times a day (QID) | INTRAMUSCULAR | Status: DC | PRN

## 2019-05-13 MED ORDER — ASPIRIN EC 81 MG PO TBEC
81.0000 mg | DELAYED_RELEASE_TABLET | Freq: Every day | ORAL | Status: DC
  Administered 2019-05-14 – 2019-05-15 (×2): 81 mg via ORAL
  Filled 2019-05-13 (×2): qty 1

## 2019-05-13 MED ORDER — MORPHINE SULFATE (PF) 4 MG/ML IV SOLN
4.0000 mg | Freq: Once | INTRAVENOUS | Status: AC
  Administered 2019-05-13: 4 mg via INTRAVENOUS
  Filled 2019-05-13: qty 1

## 2019-05-13 MED ORDER — MELATONIN 5 MG PO TABS
10.0000 mg | ORAL_TABLET | Freq: Every day | ORAL | Status: DC
  Administered 2019-05-14: 10 mg via ORAL
  Filled 2019-05-13 (×2): qty 2

## 2019-05-13 MED ORDER — LISINOPRIL 10 MG PO TABS
10.0000 mg | ORAL_TABLET | Freq: Every day | ORAL | Status: DC
  Administered 2019-05-14 – 2019-05-15 (×2): 10 mg via ORAL
  Filled 2019-05-13 (×2): qty 1

## 2019-05-13 MED ORDER — SODIUM CHLORIDE 0.9 % IV SOLN
1.0000 g | Freq: Once | INTRAVENOUS | Status: AC
  Administered 2019-05-13: 1 g via INTRAVENOUS
  Filled 2019-05-13: qty 10

## 2019-05-13 MED ORDER — ENOXAPARIN SODIUM 40 MG/0.4ML ~~LOC~~ SOLN
40.0000 mg | SUBCUTANEOUS | Status: DC
  Administered 2019-05-14: 40 mg via SUBCUTANEOUS
  Filled 2019-05-13 (×2): qty 0.4

## 2019-05-13 MED ORDER — ACETAMINOPHEN 500 MG PO TABS
1000.0000 mg | ORAL_TABLET | Freq: Once | ORAL | Status: AC
  Administered 2019-05-13: 1000 mg via ORAL

## 2019-05-13 MED ORDER — SODIUM CHLORIDE 0.9 % IV SOLN
1000.0000 mL | Freq: Once | INTRAVENOUS | Status: AC
  Administered 2019-05-13: 1000 mL via INTRAVENOUS

## 2019-05-13 NOTE — ED Provider Notes (Signed)
[facility] Emergency Department Provider Note   ____________________________________________    I have reviewed the triage vital signs and the nursing notes.   HISTORY  Chief Complaint Dysuria and Fever     HPI Breanna Day is a 54 y.o. female who presents with complaints of dysuria, left greater than right back pain and fever.  Patient notes 3 to 4 days of dysuria which has worsened.  Now has back pain and fevers.  Positive nausea, no diarrhea.  No sick contacts reported.  No cough or shortness of breath.  Is not take anything for this.  She reports a history of prediabetes.  Pain does not radiate.  Past Medical History:  Diagnosis Date  . Allergy   . Chicken pox   . Depression     Patient Active Problem List   Diagnosis Date Noted  . Prediabetes 11/22/2017  . Gastroesophageal reflux disease without esophagitis 06/24/2014  . 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 Seasonal allergies 06/24/2014  . Anxiety and depression 06/24/2014    No past surgical history on file.  Prior to Admission medications   Medication Sig Start Date End Date Taking? Authorizing Provider  atorvastatin (LIPITOR) 10 MG tablet Take 1 tablet (10 mg total) by mouth daily. 02/22/19   Lorre Munroe, NP  clobetasol cream (TEMOVATE) 0.05 % Apply 1 application topically 2 (two) times daily. 11/22/17   Lorre Munroe, NP  hydrOXYzine (VISTARIL) 25 MG capsule Take 1 capsule (25 mg total) by mouth daily as needed. 09/12/18   Lorre Munroe, NP  lisinopril-hydrochlorothiazide (ZESTORETIC) 10-12.5 MG tablet Take 1 tablet by mouth daily. 04/29/19   Lorre Munroe, NP  metFORMIN (GLUCOPHAGE) 500 MG tablet Take 1 tablet (500 mg total) by mouth 2 (two) times daily. 04/29/19   Lorre Munroe, NP  pantoprazole (PROTONIX) 40 MG tablet Take 1 tablet (40 mg total) by mouth 2 (two) times daily before a meal. 04/29/19   Baity, Salvadore Oxford, NP  sulfamethoxazole-trimethoprim (BACTRIM) 400-80 MG tablet Take 1  tablet by mouth 2 (two) times daily. 01/15/19   Lorre Munroe, NP  venlafaxine XR (EFFEXOR-XR) 150 MG 24 hr capsule Take 1 capsule (150 mg total) by mouth daily with breakfast. 04/29/19   Lorre Munroe, NP     Allergies Patient has no known allergies.  Family History  Problem Relation Age of Onset  . Hypertension Mother   . Arthritis Mother   . Hyperlipidemia Mother   . Hypertension Father   . Arthritis Father   . Hyperlipidemia Father   . Diabetes Maternal Grandmother   . Heart disease Maternal Grandmother   . Arthritis Maternal Grandmother   . Hypertension Maternal Grandmother   . Cancer Maternal Grandfather   . Heart disease Maternal Grandfather   . Arthritis Maternal Grandfather   . Hypertension Maternal Grandfather   . Diabetes Paternal Grandmother   . Cancer Paternal Grandmother   . Arthritis Paternal Grandmother   . Hypertension Paternal Grandmother   . Diabetes Paternal Grandfather   . Heart disease Paternal Grandfather   . Arthritis Paternal Grandfather   . Hypertension Paternal Grandfather   . Cancer Maternal Uncle        Colon  . Breast cancer Paternal Aunt 27    Social History Social History   Tobacco Use  . Smoking status: Never Smoker  . Smokeless tobacco: Never Used  Substance Use Topics  . Alcohol use: No    Alcohol/week: 0.0 standard drinks  .  Drug use: No    Review of Systems  Constitutional: Positive fever Eyes: No visual changes.  ENT: No sore throat. Cardiovascular: Denies chest pain. Respiratory: Denies shortness of breath. Gastrointestinal: As above Genitourinary: As above Musculoskeletal: As above Skin: Negative for rash. Neurological: Negative for headaches or weakness   ____________________________________________   PHYSICAL EXAM:  VITAL SIGNS: ED Triage Vitals  Enc Vitals Group     BP 05/13/19 1631 (!) 152/99     Pulse Rate 05/13/19 1631 (!) 123     Resp 05/13/19 1631 20     Temp 05/13/19 1631 (!) 100.8 F (38.2 C)      Temp Source 05/13/19 1631 Oral     SpO2 05/13/19 1631 96 %     Weight 05/13/19 1628 96.2 kg (212 lb)     Height 05/13/19 1628 1.575 m (5\' 2" )     Head Circumference --      Peak Flow --      Pain Score 05/13/19 1628 8     Pain Loc --      Pain Edu? --      Excl. in GC? --     Constitutional: Alert and oriented.  Nose: No congestion/rhinnorhea. Mouth/Throat: Mucous membranes are moist.    Cardiovascular: Tachycardia, regular rhythm.  Good peripheral circulation. Respiratory: Normal respiratory effort.  No retractions.  Gastrointestinal: Soft and nontender. No distention.  Left CVA tenderness  Musculoskeletal: No lower extremity tenderness nor edema.  Warm and well perfused Neurologic:  Normal speech and language. No gross focal neurologic deficits are appreciated.  Skin:  Skin is warm, dry and intact. No rash noted. Psychiatric: Mood and affect are normal. Speech and behavior are normal.  ____________________________________________   LABS (all labs ordered are listed, but only abnormal results are displayed)  Labs Reviewed  COMPREHENSIVE METABOLIC PANEL - Abnormal; Notable for the following components:      Result Value   Sodium 133 (*)    Glucose, Bld 161 (*)    Calcium 8.6 (*)    All other components within normal limits  CBC - Abnormal; Notable for the following components:   WBC 16.9 (*)    All other components within normal limits  URINALYSIS, COMPLETE (UACMP) WITH MICROSCOPIC - Abnormal; Notable for the following components:   Color, Urine AMBER (*)    APPearance CLOUDY (*)    Hgb urine dipstick MODERATE (*)    Ketones, ur 5 (*)    Protein, ur 100 (*)    Leukocytes,Ua LARGE (*)    WBC, UA >50 (*)    Bacteria, UA RARE (*)    All other components within normal limits  URINE CULTURE   ____________________________________________  EKG   ____________________________________________  RADIOLOGY  CT renal stone study negative for kidney  stone ____________________________________________   PROCEDURES  Procedure(s) performed: No  Procedures   Critical Care performed: No ____________________________________________   INITIAL IMPRESSION / ASSESSMENT AND PLAN / ED COURSE  Pertinent labs & imaging results that were available during my care of the patient were reviewed by me and considered in my medical decision making (see chart for details).  Patient presents with fever, tachycardia, elevated white blood cell count.  Given dysuria concerning for urinary tract infection versus pyonephritis.  Will treat with IV fluids, IV morphine, IV Zofran.  Will obtain CT renal stone study to rule out kidney stone.  CT scan is overall reassuring, given elevated white count of 17,000, urinalysis consistent with UTI and flank pain with suggestion of  possible stranding on CT scan consistent with pyelonephritis.  Treated with IV Rocephin, urine culture sent prior.  Given history of diabetes will discuss with hospitalist for admission    ____________________________________________   FINAL CLINICAL IMPRESSION(S) / ED DIAGNOSES  Final diagnoses:  Pyelonephritis        Note:  This document was prepared using Dragon voice recognition software and may include unintentional dictation errors.   Lavonia Drafts, MD 05/13/19 1946

## 2019-05-13 NOTE — H&P (Signed)
History and Physical    Breanna Day LNL:[id number] DOB: 1965/11/23 DOA: 05/13/2019  PCP: Lorre Munroe, NP Patient coming from: Home, lives with husband  I have personally briefly reviewed patient's old medical records in [facility]  Chief Complaint: Fever, fatigue  HPI: Breanna Day is a 54 y.o. female with medical history significant for hypertension, type 2 diabetes and GERD who presents with concerns of persistent fever and fatigue. Patient reports that about 2 days ago she began to feel generalized malaise and fatigue.  Also had fever with T-max of 102.8 Fahrenheit.  She is also noticed frequent urination without any dysuria.  Also noted dull achy lower back pain worse on the left side.  She has felt nauseous but denies any vomiting.  No abdominal pain or diarrhea.  Patient has history of recurrent UTI and her last 1 was about 2 to 3 weeks ago.  States she was given a sulfa drug but felt like it gave her side effects.  She denies any tobacco, alcohol or illicit drug use.  ED Course: She was febrile up to 100.8, tachycardic and mildly hypertensive up to 150s over 90s and on room air. WBC of 16.9.  Sodium of 133, glucose of 161. UA showed large leukocyte and negative nitrite.  CT renal stone search showed no hydronephrosis or nephrolithiasis.  There is left perinephric fat stranding.  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  There is a 14 mm peripherally calcified mass in the right posterior pelvis possibly representing area of fat necrosis.  She was given 4 mg of morphine, 4 mg Zofran and started on IV Rocephin in the ED.  Review of Systems:  Constitutional: No Weight Change, + Fever ENT/Mouth: No sore throat, No Rhinorrhea Eyes: No Eye Pain, No Vision Changes Cardiovascular: No Chest Pain, no SOB Respiratory: No Cough, No Sputum Gastrointestinal: + Nausea, No Vomiting, No Diarrhea, No Constipation, No  Pain Genitourinary: no Urinary Incontinence, No Urgency, + Flank Pain Musculoskeletal: No Arthralgias, No Myalgias Skin: No Skin Lesions, No Pruritus, Neuro: no Weakness, No Numbness Psych: No Anxiety/Panic, No Depression, no decrease appetite Heme/Lymph: No Bruising, No Bleeding  Past Medical History:  Diagnosis Date   Allergy    Chicken pox    Depression     Surgery: Tubal ligation  reports that she has never smoked. She has never used smokeless tobacco. She reports that she does not drink alcohol or use drugs.  No Known Allergies  Family History  Problem Relation Age of Onset   Hypertension Mother    Arthritis Mother    Hyperlipidemia Mother    Hypertension Father    Arthritis Father    Hyperlipidemia Father    Diabetes Maternal Grandmother    Heart disease Maternal Grandmother    Arthritis Maternal Grandmother    Hypertension Maternal Grandmother    Cancer Maternal Grandfather    Heart disease Maternal Grandfather    Arthritis Maternal Grandfather    Hypertension Maternal Grandfather    Diabetes Paternal Grandmother    Cancer Paternal Grandmother    Arthritis Paternal Grandmother    Hypertension Paternal Grandmother    Diabetes Paternal Grandfather    Heart disease Paternal Grandfather    Arthritis Paternal Grandfather    Hypertension Paternal Grandfather    Cancer Maternal Uncle        Colon   Breast cancer Paternal Aunt 45     Prior to Admission  medications   Medication Sig Start Date End Date Taking? Authorizing Provider  aspirin EC 81 MG tablet Take 81 mg by mouth daily.   Yes [provider]  Biotin 10 MG TABS Take 10 mg by mouth daily.   Yes [provider]  hydrOXYzine (VISTARIL) 25 MG capsule Take 1 capsule (25 mg total) by mouth daily as needed. Patient taking differently: Take 25 mg by mouth daily as needed for itching.  09/12/18  Yes Baity, Salvadore Oxford, NP  lisinopril (ZESTRIL) 10 MG tablet Take 10 mg by  mouth daily. 03/25/19  Yes [provider]  Melatonin 10 MG TABS Take 10 mg by mouth at bedtime.   Yes [provider]  metFORMIN (GLUCOPHAGE) 500 MG tablet Take 1 tablet (500 mg total) by mouth 2 (two) times daily. 04/29/19  Yes Lorre Munroe, NP  pantoprazole (PROTONIX) 40 MG tablet Take 1 tablet (40 mg total) by mouth 2 (two) times daily before a meal. 04/29/19  Yes Lorre Munroe, NP  venlafaxine XR (EFFEXOR-XR) 150 MG 24 hr capsule Take 1 capsule (150 mg total) by mouth daily with breakfast. 04/29/19  Yes Lorre Munroe, NP    Physical Exam: Vitals:   05/13/19 1628 05/13/19 1631 05/13/19 1707 05/13/19 1745  BP:  (!) 152/99    Pulse:  (!) 123  (!) 108  Resp:  20    Temp:  (!) 100.8 F (38.2 C) 100.3 F (37.9 C)   TempSrc:  Oral Oral   SpO2:  96%  95%  Weight: 96.2 kg     Height: 5\' 2"  (1.575 m)       Constitutional: NAD, calm, comfortable, well-appearing female laying flat in bed Vitals:   05/13/19 1628 05/13/19 1631 05/13/19 1707 05/13/19 1745  BP:  (!) 152/99    Pulse:  (!) 123  (!) 108  Resp:  20    Temp:  (!) 100.8 F (38.2 C) 100.3 F (37.9 C)   TempSrc:  Oral Oral   SpO2:  96%  95%  Weight: 96.2 kg     Height: 5\' 2"  (1.575 m)      Eyes: PERRL, lids and conjunctivae normal ENMT: Mucous membranes are moist.  Neck: normal, supple Respiratory: clear to auscultation bilaterally, no wheezing, no crackles. Normal respiratory effort on room air. No accessory muscle use.  Cardiovascular: Regular rate and rhythm, no murmurs / rubs / gallops. No extremity edema. 2+ pedal pulses. No carotid bruits.  Abdomen: no tenderness, no masses palpated.Bowel sounds positive.  Mild left-sided CVA tenderness. Musculoskeletal: no clubbing / cyanosis. No joint deformity upper and lower extremities. Good ROM, no contractures. Normal muscle tone.  Skin: no rashes, lesions, ulcers. No induration Neurologic: CN 2-12 grossly intact. Sensation intact. Strength 5/5 in all 4.   Psychiatric: Normal judgment and insight. Alert and oriented x 3. Normal mood.     Labs on Admission: I have personally reviewed following labs and imaging studies  CBC: Recent Labs  Lab 05/13/19 1632  WBC 16.9*  HGB 12.8  HCT 38.8  MCV 85.1  PLT 348   Basic Metabolic Panel: Recent Labs  Lab 05/13/19 1632  NA 133*  K 4.3  CL 99  CO2 25  GLUCOSE 161*  BUN 12  CREATININE 0.69  CALCIUM 8.6*   GFR: Estimated Creatinine Clearance: 87.9 mL/min (by C-G formula based on SCr of 0.69 mg/dL). Liver Function Tests: Recent Labs  Lab 05/13/19 1632  AST 18  ALT 24  ALKPHOS 82  BILITOT 0.6  PROT 7.7  ALBUMIN 3.9   No results for input(s): LIPASE, AMYLASE in the last 168 hours. No results for input(s): AMMONIA in the last 168 hours. Coagulation Profile: No results for input(s): INR, PROTIME in the last 168 hours. Cardiac Enzymes: No results for input(s): CKTOTAL, CKMB, CKMBINDEX, TROPONINI in the last 168 hours. BNP (last 3 results) No results for input(s): PROBNP in the last 8760 hours. HbA1C: No results for input(s): HGBA1C in the last 72 hours. CBG: No results for input(s): GLUCAP in the last 168 hours. Lipid Profile: No results for input(s): CHOL, HDL, LDLCALC, TRIG, CHOLHDL, LDLDIRECT in the last 72 hours. Thyroid Function Tests: No results for input(s): TSH, T4TOTAL, FREET4, T3FREE, THYROIDAB in the last 72 hours. Anemia Panel: No results for input(s): VITAMINB12, FOLATE, FERRITIN, TIBC, IRON, RETICCTPCT in the last 72 hours. Urine analysis:    Component Value Date/Time   COLORURINE AMBER (A) 05/13/2019 1632   APPEARANCEUR CLOUDY (A) 05/13/2019 1632   LABSPEC 1.020 05/13/2019 1632   PHURINE 5.0 05/13/2019 1632   GLUCOSEU NEGATIVE 05/13/2019 1632   HGBUR MODERATE (A) 05/13/2019 1632   BILIRUBINUR NEGATIVE 05/13/2019 1632   BILIRUBINUR neg 01/15/2019 1215   KETONESUR 5 (A) 05/13/2019 1632   PROTEINUR 100 (A) 05/13/2019 1632   UROBILINOGEN 0.2 01/15/2019  1215   NITRITE NEGATIVE 05/13/2019 1632   LEUKOCYTESUR LARGE (A) 05/13/2019 1632    Radiological Exams on Admission: CT RENAL STONE STUDY  Result Date: 05/13/2019 CLINICAL DATA:  Dysuria and fever EXAM: CT ABDOMEN AND PELVIS WITHOUT CONTRAST TECHNIQUE: Multidetector CT imaging of the abdomen and pelvis was performed following the standard protocol without IV contrast. COMPARISON:  None. FINDINGS: Lower chest: Lung bases demonstrate no acute consolidation or effusion. The heart size is normal Hepatobiliary: Hepatic steatosis. No calcified gallstone or biliary dilatation. Fat sparing near the gallbladder fossa Pancreas: Unremarkable. No pancreatic ductal dilatation or surrounding inflammatory changes. Spleen: Normal in size without focal abnormality. Adrenals/Urinary Tract: Adrenal glands are normal. No hydronephrosis. Left perinephric fat stranding. 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 Stomach/Bowel: Stomach is within normal limits. Appendix appears normal. No evidence of bowel wall thickening, distention, or inflammatory changes. Vascular/Lymphatic: No significant vascular findings are present. No enlarged abdominal or pelvic lymph nodes. Reproductive: Uterus and bilateral adnexa are unremarkable. Other: No free air or free fluid. 14 mm low-density peripherally calcified mass in the posterior right pelvis. Musculoskeletal: No acute or significant osseous findings. IMPRESSION: 1. Negative for hydronephrosis or ureteral stone. Mild left perinephric fat stranding, nonspecific, but could be seen in the setting of pyelonephritis. 2. 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 Hepatic steatosis with fat sparing near the gallbladder fossa 4. 14 mm peripherally calcified mass in the right posterior pelvis, indeterminate, but possibly representing an area of fat necrosis.  Electronically Signed   By: Jasmine Pang M.D.   On: 05/13/2019 18:11     Assessment/Plan  Sepsis secondary to pyelonephritis Continue daily IV Rocephin Continue to follow fever curve Urine culture pending  Hypertension Continue lisinopril  Type 2 diabetes Sensitive sliding scale  Depression Continue Effexor  GERD Continue PPI  DVT prophylaxis:.Lovenox Code Status: Full Family Communication: Plan discussed with patient at bedside  disposition Plan: Home with at least 2 midnight stays  Consults called:  Admission status: inpatient with at least 2 minutes requiring IV antibiotics for treatment of pyelonephritis  Orene Desanctis DO [facility]   If 7PM-7AM, please contact night-coverage [email or web address]   05/13/2019, 10:13 PM

## 2019-05-13 NOTE — Plan of Care (Signed)
  Problem: Education: Goal: Knowledge of General Education information will improve Description: Including pain rating scale, medication(s)/side effects and non-pharmacologic comfort measures Outcome: Progressing   Problem: Clinical Measurements: Goal: Will remain free from infection Outcome: Progressing Goal: Cardiovascular complication will be avoided Outcome: Progressing   Problem: Nutrition: Goal: Adequate nutrition will be maintained Outcome: Progressing   Problem: Coping: Goal: Level of anxiety will decrease Outcome: Progressing   Problem: Elimination: Goal: Will not experience complications related to bowel motility Outcome: Progressing

## 2019-05-13 NOTE — ED Notes (Signed)
Pt able to ambulate to the commode without direct assistance. Pt returned to bed, lights dimmed and resting comfortably.

## 2019-05-13 NOTE — ED Triage Notes (Signed)
Pt sent from [facility] for eval of kidney infection and fever.  Pt has dysuria and fever for 4 days.  Pt has nausea.   Pt alert  Speech clear.

## 2019-05-13 NOTE — ED Notes (Addendum)
ED TO INPATIENT HANDOFF REPORT  ED Nurse Name and Phone #:   Elijah Birk RN   [phone]  S Name/Age/Gender Breanna Day 54 y.o. female Room/Bed: ED35A/ED35A  Code Status   Code Status: DNR  Home/SNF/Other Home Patient oriented to: self, place, time and situation Is this baseline? Yes   Triage Complete: Triage complete  Chief Complaint Pyelonephritis [N12]  Triage Note Pt sent from [facility] for eval of kidney infection and fever.  Pt has dysuria and fever for 4 days.  Pt has nausea.   Pt alert  Speech clear.       Allergies No Known Allergies  Level of Care/Admitting Diagnosis ED Disposition    ED Disposition Condition Comment   Admit  Hospital Area: [facility] [[id number]]  Level of Care: Med-Surg [16]  Covid Evaluation: Asymptomatic Screening Protocol (No Symptoms)  Diagnosis: Pyelonephritis [537482]  Admitting Physician: Anselm Jungling [[id number]]  Attending Physician: Anselm Jungling [[id number]]  Estimated length of stay: past midnight tomorrow  Certification:: I certify this patient will need inpatient services for at least 2 midnights       B Medical/Surgery History Past Medical History:  Diagnosis Date  . Allergy   . Chicken pox   . Depression    No past surgical history on file.   A IV Location/Drains/Wounds Patient Lines/Drains/Airways Status   Active Line/Drains/Airways    Name:   Placement date:   Placement time:   Site:   Days:   Peripheral IV 05/13/19 Right Forearm   05/13/19    1742    Forearm   less than 1          Intake/Output Last 24 hours  Intake/Output Summary (Last 24 hours) at 05/13/2019 2233 Last data filed at 05/13/2019 2205 Gross per 24 hour  Intake 1000 ml  Output --  Net 1000 ml    Labs/Imaging Results for orders placed or performed during the hospital encounter of 05/13/19 (from the past 48 hour(s))  Comprehensive metabolic panel     Status: Abnormal   Collection Time: 05/13/19  4:32 PM  Result Value Ref  Range   Sodium 133 (L) 135 - 145 mmol/L   Potassium 4.3 3.5 - 5.1 mmol/L   Chloride 99 98 - 111 mmol/L   CO2 25 22 - 32 mmol/L   Glucose, Bld 161 (H) 70 - 99 mg/dL    Comment: Glucose reference range applies only to samples taken after fasting for at least 8 hours.   BUN 12 6 - 20 mg/dL   Creatinine, Ser 0.71 0.44 - 1.00 mg/dL   Calcium 8.6 (L) 8.9 - 10.3 mg/dL   Total Protein 7.7 6.5 - 8.1 g/dL   Albumin 3.9 3.5 - 5.0 g/dL   AST 18 15 - 41 U/L   ALT 24 0 - 44 U/L   Alkaline Phosphatase 82 38 - 126 U/L   Total Bilirubin 0.6 0.3 - 1.2 mg/dL   GFR calc non Af Amer >60 >60 mL/min   GFR calc Af Amer >60 >60 mL/min   Anion gap 9 5 - 15    Comment: Performed at [facility], [address] [place], [place] [address]  CBC     Status: Abnormal   Collection Time: 05/13/19  4:32 PM  Result Value Ref Range   WBC 16.9 (H) 4.0 - 10.5 K/uL   RBC 4.56 3.87 - 5.11 MIL/uL   Hemoglobin 12.8 12.0 - 15.0 g/dL   HCT 88.3 25.4 -  46.0 %   MCV 85.1 80.0 - 100.0 fL   MCH 28.1 26.0 - 34.0 pg   MCHC 33.0 30.0 - 36.0 g/dL   RDW 12.7 11.5 - 15.5 %   Platelets 348 150 - 400 K/uL   nRBC 0.0 0.0 - 0.2 %    Comment: Performed at [facility], [address] [place], [place] [address]  Urinalysis, Complete w Microscopic     Status: Abnormal   Collection Time: 05/13/19  4:32 PM  Result Value Ref Range   Color, Urine AMBER (A) YELLOW    Comment: BIOCHEMICALS MAY BE AFFECTED BY COLOR   APPearance CLOUDY (A) CLEAR   Specific Gravity, Urine 1.020 1.005 - 1.030   pH 5.0 5.0 - 8.0   Glucose, UA NEGATIVE NEGATIVE mg/dL   Hgb urine dipstick MODERATE (A) NEGATIVE   Bilirubin Urine NEGATIVE NEGATIVE   Ketones, ur 5 (A) NEGATIVE mg/dL   Protein, ur 100 (A) NEGATIVE mg/dL   Nitrite NEGATIVE NEGATIVE   Leukocytes,Ua LARGE (A) NEGATIVE   RBC / HPF 21-50 0 - 5 RBC/hpf   WBC, UA >50 (H) 0 - 5 WBC/hpf   Bacteria, UA RARE (A) NONE SEEN   Squamous Epithelial / LPF 0-5 0 - 5   WBC Clumps PRESENT     Mucus PRESENT     Comment: Performed at [facility], [address] [place], [place] [address]   CT RENAL STONE STUDY  Result Date: 05/13/2019 CLINICAL DATA:  Dysuria and fever EXAM: CT ABDOMEN AND PELVIS WITHOUT CONTRAST TECHNIQUE: Multidetector CT imaging of the abdomen and pelvis was performed following the standard protocol without IV contrast. COMPARISON:  None. FINDINGS: Lower chest: Lung bases demonstrate no acute consolidation or effusion. The heart size is normal Hepatobiliary: Hepatic steatosis. No calcified gallstone or biliary dilatation. Fat sparing near the gallbladder fossa Pancreas: Unremarkable. No pancreatic ductal dilatation or surrounding inflammatory changes. Spleen: Normal in size without focal abnormality. Adrenals/Urinary Tract: Adrenal glands are normal. No hydronephrosis. Left perinephric fat stranding. 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 Stomach/Bowel: Stomach is within normal limits. Appendix appears normal. No evidence of bowel wall thickening, distention, or inflammatory changes. Vascular/Lymphatic: No significant vascular findings are present. No enlarged abdominal or pelvic lymph nodes. Reproductive: Uterus and bilateral adnexa are unremarkable. Other: No free air or free fluid. 14 mm low-density peripherally calcified mass in the posterior right pelvis. Musculoskeletal: No acute or significant osseous findings. IMPRESSION: 1. Negative for hydronephrosis or ureteral stone. Mild left perinephric fat stranding, nonspecific, but could be seen in the setting of pyelonephritis. 2. 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 Hepatic steatosis with fat sparing near the gallbladder fossa 4. 14 mm peripherally calcified mass in the right posterior pelvis, indeterminate, but possibly representing an area of fat necrosis. Electronically  Signed   By: Donavan Foil M.D.   On: 05/13/2019 18:11    Pending Labs Unresulted Labs (From admission, onward)    Start     Ordered   05/14/19 1941  Basic metabolic panel  Tomorrow morning,   STAT     05/13/19 2140   05/14/19 0500  CBC  Tomorrow morning,   STAT     05/13/19 2140   05/13/19 2142  Hemoglobin A1c  Once,   STAT    Comments: To assess prior glycemic control    05/13/19 2141   05/13/19  2140  HIV Antibody (routine testing w rflx)  (HIV Antibody (Routine testing w reflex) panel)  Once,   STAT     05/13/19 2140   05/13/19 1732  Urine culture  Add-on,   AD     05/13/19 1732          Vitals/Pain Today's Vitals   05/13/19 1740 05/13/19 1745 05/13/19 1909 05/13/19 2230  BP:    133/78  Pulse:  (!) 108  92  Resp:    18  Temp:    98.7 F (37.1 C)  TempSrc:    Oral  SpO2:  95%  98%  Weight:      Height:      PainSc: 7   7  2      Isolation Precautions No active isolations  Medications Medications  enoxaparin (LOVENOX) injection 40 mg (has no administration in time range)  cefTRIAXone (ROCEPHIN) 2 g in sodium chloride 0.9 % 100 mL IVPB (has no administration in time range)  acetaminophen (TYLENOL) tablet 650 mg (has no administration in time range)  insulin aspart (novoLOG) injection 0-9 Units (has no administration in time range)  aspirin EC tablet 81 mg (has no administration in time range)  lisinopril (ZESTRIL) tablet 10 mg (has no administration in time range)  venlafaxine XR (EFFEXOR-XR) 24 hr capsule 150 mg (has no administration in time range)  pantoprazole (PROTONIX) EC tablet 40 mg (has no administration in time range)  Melatonin TABS 10 mg (has no administration in time range)  ondansetron (ZOFRAN) injection 4 mg (has no administration in time range)  acetaminophen (TYLENOL) tablet 1,000 mg (1,000 mg Oral Given 05/13/19 1635)  cefTRIAXone (ROCEPHIN) 1 g in sodium chloride 0.9 % 100 mL IVPB (0 g Intravenous Stopped 05/13/19 1856)  0.9 %  sodium chloride  infusion (0 mLs Intravenous Stopped 05/13/19 2205)  morphine 4 MG/ML injection 4 mg (4 mg Intravenous Given 05/13/19 1743)  ondansetron (ZOFRAN) injection 4 mg (4 mg Intravenous Given 05/13/19 1743)    Mobility walks Low fall risk   Focused Assessments Cardiac Assessment Handoff:    No results found for: CKTOTAL, CKMB, CKMBINDEX, TROPONINI No results found for: DDIMER Does the Patient currently have chest pain? No      R Recommendations: See Admitting Provider Note  Report given to: 07/13/19 on [facility]  Additional Notes:

## 2019-05-14 LAB — CBC
HCT: 34.6 % — ABNORMAL LOW (ref 36.0–46.0)
Hemoglobin: 11.5 g/dL — ABNORMAL LOW (ref 12.0–15.0)
MCH: 28 pg (ref 26.0–34.0)
MCHC: 33.2 g/dL (ref 30.0–36.0)
MCV: 84.4 fL (ref 80.0–100.0)
Platelets: 301 10*3/uL (ref 150–400)
RBC: 4.1 MIL/uL (ref 3.87–5.11)
RDW: 12.7 % (ref 11.5–15.5)
WBC: 15.3 10*3/uL — ABNORMAL HIGH (ref 4.0–10.5)
nRBC: 0 % (ref 0.0–0.2)

## 2019-05-14 LAB — BASIC METABOLIC PANEL
Anion gap: 7 (ref 5–15)
BUN: 10 mg/dL (ref 6–20)
CO2: 25 mmol/L (ref 22–32)
Calcium: 8.3 mg/dL — ABNORMAL LOW (ref 8.9–10.3)
Chloride: 105 mmol/L (ref 98–111)
Creatinine, Ser: 0.59 mg/dL (ref 0.44–1.00)
GFR calc Af Amer: >60 mL/min (ref >60)
GFR calc non Af Amer: >60 mL/min (ref >60)
Glucose, Bld: 181 mg/dL — ABNORMAL HIGH (ref 70–99)
Potassium: 3.9 mmol/L (ref 3.5–5.1)
Sodium: 137 mmol/L (ref 135–145)

## 2019-05-14 LAB — HIV ANTIBODY (ROUTINE TESTING W REFLEX): HIV Screen 4th Generation wRfx: NONREACTIVE

## 2019-05-14 LAB — GLUCOSE, CAPILLARY
Glucose-Capillary: 161 mg/dL — ABNORMAL HIGH (ref 70–99)
Glucose-Capillary: 179 mg/dL — ABNORMAL HIGH (ref 70–99)
Glucose-Capillary: 143 mg/dL — ABNORMAL HIGH (ref 70–99)
Glucose-Capillary: 136 mg/dL — ABNORMAL HIGH (ref 70–99)

## 2019-05-14 LAB — HEMOGLOBIN A1C
Hgb A1c MFr Bld: 7.8 % — ABNORMAL HIGH (ref 4.8–5.6)
Mean Plasma Glucose: 177.16 mg/dL

## 2019-05-14 MED ORDER — HYDROXYZINE HCL 10 MG PO TABS
25.0000 mg | ORAL_TABLET | Freq: Every day | ORAL | Status: DC | PRN
  Administered 2019-05-14: 25 mg via ORAL
  Filled 2019-05-14 (×3): qty 3

## 2019-05-14 MED ORDER — INSULIN GLARGINE 100 UNIT/ML ~~LOC~~ SOLN
10.0000 [IU] | Freq: Every day | SUBCUTANEOUS | Status: DC
  Administered 2019-05-14: 10 [IU] via SUBCUTANEOUS
  Filled 2019-05-14 (×2): qty 0.1

## 2019-05-14 NOTE — Progress Notes (Signed)
PROGRESS NOTE    Breanna Day  XNA:[id number] DOB: [birth year] DOA: 05/13/2019 PCP: Jearld Fenton, NP   Brief Narrative:  Breanna Day is a 54 y.o. female with medical history significant for hypertension, type 2 diabetes and GERD who presents with concerns of persistent fever and fatigue.  Also experiencing urinary frequency and urgency along with some nausea.  History of recurrent UTI.  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  Admitted for pyelonephritis.  Subjective: Patient was feeling little better when seen this morning.  Abdominal pain is improved.  She was able to tolerate her meals.  Assessment & Plan:   Principal Problem:   Pyelonephritis Active Problems:   Gastroesophageal reflux disease without esophagitis   Anxiety and depression   Sepsis ([place])   Type 2 diabetes mellitus without complication (HCC)  Pyelonephritis.  Sepsis ruled out. Clinically seems improving.  Urine cultures pending. -Continue ceftriaxone-we will de-escalate antibiotics according to culture results.  Hypertension Continue lisinopril  Type 2 diabetes.  A1c of 7.8, improved from prior of 9.2. CBG mildly elevated but stays below 200. -Add Lantus 10 units at bedtime. -Continue sliding scale  Depression Continue Effexor  GERD Continue PPI  Objective: Vitals:   05/13/19 2230 05/13/19 2256 05/13/19 2256 05/14/19 0507  BP: 133/78  123/69 114/76  Pulse: 92  98 99  Resp: 18  20 20   Temp: 98.7 F (37.1 C)  99.3 F (37.4 C) 99.7 F (37.6 C)  TempSrc: Oral  Oral Oral  SpO2: 98%  98% 97%  Weight:  93.9 kg    Height:  5\' 2"  (1.575 m)      Intake/Output Summary (Last 24 hours) at 05/14/2019 0748 Last data filed at 05/14/2019 0529 Gross per 24 hour  Intake 1240 ml  Output 400 ml  Net 840  ml   Filed Weights   05/13/19 1628 05/13/19 2256  Weight: 96.2 kg 93.9 kg    Examination:  General exam: Appears calm and comfortable  Respiratory system: Clear to auscultation. Respiratory effort normal. Cardiovascular system: S1 & S2 heard, RRR. No JVD, murmurs, rubs, gallops or clicks. Gastrointestinal system: Soft, nontender, nondistended, bowel sounds positive. Central nervous system: Alert and oriented. No focal neurological deficits.Symmetric 5 x 5 power. Extremities: No edema, no cyanosis, pulses intact and symmetrical. Skin: No rashes, lesions or ulcers Psychiatry: Judgement and insight appear normal. Mood & affect appropriate.    DVT prophylaxis: Lovenox Code Status: DNR Family Communication: No family at bedside Disposition Plan: Pending improvement.  Most likely will go home tomorrow if remains stable.  Consultants:   None  Procedures:  Antimicrobials:  Ceftriaxone  Data Reviewed: I have personally reviewed following labs and imaging studies  CBC: Recent Labs  Lab 05/13/19 1632 05/14/19 0525  WBC 16.9* 15.3*  HGB 12.8 11.5*  HCT 38.8 34.6*  MCV 85.1 84.4  PLT 348 542   Basic Metabolic Panel: Recent Labs  Lab 05/13/19 1632 05/14/19 0525  NA 133* 137  K 4.3 3.9  CL 99 105  CO2 25 25  GLUCOSE 161* 181*  BUN 12 10  CREATININE 0.69 0.59  CALCIUM 8.6* 8.3*   GFR: Estimated Creatinine Clearance: 86.8 mL/min (by C-G formula based on SCr of 0.59 mg/dL). Liver Function Tests: Recent Labs  Lab 05/13/19 1632  AST 18  ALT 24  ALKPHOS 82  BILITOT 0.6  PROT 7.7  ALBUMIN 3.9   No results for input(s): LIPASE, AMYLASE in the last 168 hours. No results for input(s): AMMONIA in the last 168 hours. Coagulation Profile: No results for input(s): INR, PROTIME in the last 168 hours. Cardiac Enzymes: No results for input(s): CKTOTAL, CKMB, CKMBINDEX, TROPONINI in the last 168 hours. BNP (last 3 results) No results for input(s): PROBNP in the last 8760  hours. HbA1C: No results for input(s): HGBA1C in the last 72 hours. CBG: Recent Labs  Lab 05/13/19 2302  GLUCAP 124*   Lipid Profile: No results for input(s): CHOL, HDL, LDLCALC, TRIG, CHOLHDL, LDLDIRECT in the last 72 hours. Thyroid Function Tests: No results for input(s): TSH, T4TOTAL, FREET4, T3FREE, THYROIDAB in the last 72 hours. Anemia Panel: No results for input(s): VITAMINB12, FOLATE, FERRITIN, TIBC, IRON, RETICCTPCT in the last 72 hours. Sepsis Labs: No results for input(s): PROCALCITON, LATICACIDVEN in the last 168 hours.  No results found for this or any previous visit (from the past 240 hour(s)).   Radiology Studies: CT RENAL STONE STUDY  Result Date: 05/13/2019 CLINICAL DATA:  Dysuria and fever EXAM: CT ABDOMEN AND PELVIS WITHOUT CONTRAST TECHNIQUE: Multidetector CT imaging of the abdomen and pelvis was performed following the standard protocol without IV contrast. COMPARISON:  None. FINDINGS: Lower chest: Lung bases demonstrate no acute consolidation or effusion. The heart size is normal Hepatobiliary: Hepatic steatosis. No calcified gallstone or biliary dilatation. Fat sparing near the gallbladder fossa Pancreas: Unremarkable. No pancreatic ductal dilatation or surrounding inflammatory changes. Spleen: Normal in size without focal abnormality. Adrenals/Urinary Tract: Adrenal glands are normal. No hydronephrosis. Left perinephric fat stranding. 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 Stomach/Bowel: Stomach is within normal limits. Appendix appears normal. No evidence of bowel wall thickening, distention, or inflammatory changes. Vascular/Lymphatic: No significant vascular findings are present. No enlarged abdominal or pelvic lymph nodes. Reproductive: Uterus and bilateral adnexa are unremarkable. Other: No free air or free fluid. 14 mm low-density peripherally calcified mass in the posterior right  pelvis. Musculoskeletal: No acute or significant osseous findings. IMPRESSION: 1. Negative for hydronephrosis or ureteral stone. Mild left perinephric fat stranding, nonspecific, but could be seen in the setting of pyelonephritis. 2. 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 Hepatic steatosis with fat sparing near the gallbladder fossa 4. 14 mm peripherally calcified mass in the right posterior pelvis, indeterminate, but possibly representing an area of fat necrosis. Electronically Signed   By: Jasmine Pang M.D.   On: 05/13/2019 18:11    Scheduled Meds: . aspirin EC  81 mg Oral Daily  . enoxaparin (LOVENOX) injection  40 mg Subcutaneous Q24H  . insulin aspart  0-9 Units Subcutaneous TID WC  . lisinopril  10 mg Oral Daily  . Melatonin  10 mg Oral QHS  . pantoprazole  40 mg Oral BID AC  . venlafaxine XR  150 mg Oral Q breakfast   Continuous Infusions: . cefTRIAXone (ROCEPHIN)  IV       LOS: 1 day   Time spent: 35 minutes.  Arnetha Courser, MD [facility]  If 7PM-7AM, please contact night-coverage [email or web address]  05/14/2019, 7:48 AM   This record has been created using Conservation officer, historic buildings. Errors have been sought and corrected,but may not always be located. Such creation errors do not reflect on the standard of care.

## 2019-05-14 NOTE — Progress Notes (Signed)
   05/14/19 1110  Clinical Encounter Type  Visited With Patient  Visit Type Initial  Referral From Nurse  Consult/Referral To Chaplain  Chaplain visited with patient in response to OR for AD. Chaplain asked if patient had questions about AD and she said no because she completed one in the past for her mother. Chaplain left AD with patient,

## 2019-05-15 ENCOUNTER — Other Ambulatory Visit: Payer: Self-pay | Admitting: Internal Medicine

## 2019-05-15 LAB — BASIC METABOLIC PANEL
Anion gap: 10 (ref 5–15)
BUN: 8 mg/dL (ref 6–20)
CO2: 25 mmol/L (ref 22–32)
Calcium: 8.5 mg/dL — ABNORMAL LOW (ref 8.9–10.3)
Chloride: 104 mmol/L (ref 98–111)
Creatinine, Ser: 0.57 mg/dL (ref 0.44–1.00)
GFR calc Af Amer: >60 mL/min (ref >60)
GFR calc non Af Amer: >60 mL/min (ref >60)
Glucose, Bld: 136 mg/dL — ABNORMAL HIGH (ref 70–99)
Potassium: 4 mmol/L (ref 3.5–5.1)
Sodium: 139 mmol/L (ref 135–145)

## 2019-05-15 LAB — GLUCOSE, CAPILLARY
Glucose-Capillary: 143 mg/dL — ABNORMAL HIGH (ref 70–99)
Glucose-Capillary: 155 mg/dL — ABNORMAL HIGH (ref 70–99)

## 2019-05-15 LAB — CBC
HCT: 34.8 % — ABNORMAL LOW (ref 36.0–46.0)
Hemoglobin: 11.4 g/dL — ABNORMAL LOW (ref 12.0–15.0)
MCH: 28.3 pg (ref 26.0–34.0)
MCHC: 32.8 g/dL (ref 30.0–36.0)
MCV: 86.4 fL (ref 80.0–100.0)
Platelets: 326 10*3/uL (ref 150–400)
RBC: 4.03 MIL/uL (ref 3.87–5.11)
RDW: 12.5 % (ref 11.5–15.5)
WBC: 11.3 10*3/uL — ABNORMAL HIGH (ref 4.0–10.5)
nRBC: 0 % (ref 0.0–0.2)

## 2019-05-15 MED ORDER — CEFDINIR 300 MG PO CAPS: 300.0000 mg | ORAL_CAPSULE | Freq: Two times a day (BID) | ORAL | 0 refills | Status: DC

## 2019-05-15 NOTE — Discharge Summary (Signed)
Physician Discharge Summary  Breanna Breanna Day EQA:[id number] DOB: [birth year] DOA: 05/13/2019  PCP: Jearld Fenton, NP  Admit date: 05/13/2019 Discharge date: 05/15/2019  Recommendations for Outpatient Follow-up:  1. Follow up with PCP in 7-10 days.  Discharge Diagnoses: Principal diagnosis is #1 1. Pyelonephritis 2. GERD without esophagitis 3. Anxiety and depression 4. Sepsis 5. DM II  Discharge Condition: Fair  Disposition: Home  Diet recommendation: Heart healthy and modified carbohydrate  Filed Weights   05/13/19 1628 05/13/19 2256  Weight: 96.2 kg 93.9 kg   History of present illness: Breanna Breanna Day is a 54 y.o. female with medical history significant for hypertension, type 2 diabetes and GERD who presents with concerns of persistent fever and fatigue. Patient reports that about 2 days ago she began to feel generalized malaise and fatigue.  Also had fever with T-max of 102.8 Fahrenheit.  She is also noticed frequent urination without any dysuria.  Also noted dull achy lower back pain worse on the left side.  She has felt nauseous but denies any vomiting.  No abdominal pain or diarrhea.  Patient has history of recurrent UTI and her last 1 was about 2 to 3 weeks ago.  States she was given a sulfa drug but felt like it gave her side effects.  She denies any tobacco, alcohol or illicit drug use.  ED Course: She was febrile up to 100.8, tachycardic and mildly hypertensive up to 150s over 90s and on room air. WBC of 16.9.  Sodium of 133, glucose of 161. UA showed large leukocyte and negative nitrite.  CT renal stone search showed no hydronephrosis or nephrolithiasis.  There is left perinephric fat stranding.  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  There is a 14 mm peripherally calcified mass in the right posterior pelvis possibly representing area of fat necrosis.  Breanna Day Course: Breanna Breanna Day a 54 y.o.femalewith medical  history significant forhypertension, type 2 diabetes and GERD who presents with concerns of persistent fever and fatigue.  Also experiencing urinary frequency and urgency along with some nausea.  History of recurrent UTI.  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  Admitted for pyelonephritis.  Today's assessment: S: The patient is feeling better. No new complaints. O: Vitals:  Vitals:   05/15/19 0434 05/15/19 0908  BP: 127/75 129/74  Pulse: 95   Resp: 20   Temp: 98.6 F (37 C)   SpO2: 95%    Exam:  Constitutional:  . The patient is awake, alert, and oriented x 3. No acute distress. Respiratory:  . No increased work of breathing. . No wheezes, rales, or rhonchi . No tactile fremitus Cardiovascular:  . Regular rate and rhythm . No murmurs, ectopy, or gallups. . No lateral PMI. No thrills. Abdomen:  . Abdomen is soft, non-tender, non-distended . No hernias, masses, or organomegaly . Normoactive bowel sounds.  Musculoskeletal:  . No cyanosis, clubbing, or edema Skin:  . No rashes, lesions, ulcers . palpation of skin: no induration or nodules Neurologic:  . CN 2-12 intact . Sensation all 4 extremities intact Psychiatric:  . Mental status o Mood, affect appropriate o Orientation to person, place, time  . judgment and insight appear intact  Discharge Instructions  Discharge Instructions  Activity as tolerated - No restrictions   Complete by: As directed    Call MD for:  severe uncontrolled pain   Complete by: As directed    Call MD for:  temperature >100.4   Complete by: As directed    Diet - low sodium heart healthy   Complete by: As directed    Discharge instructions   Complete by: As directed    Follow up with PCP in 7-10 days.   Increase activity slowly    Complete by: As directed      Allergies as of 05/15/2019   No Known Allergies     Medication List    TAKE these medications   aspirin EC 81 MG tablet Take 81 mg by mouth daily.   atorvastatin 10 MG tablet Commonly known as: LIPITOR Take 1 tablet (10 mg total) by mouth daily.   Biotin 10 MG Tabs Take 10 mg by mouth daily.   cefdinir 300 MG capsule Commonly known as: OMNICEF Take 1 capsule (300 mg total) by mouth 2 (two) times daily.   hydrOXYzine 25 MG capsule Commonly known as: VISTARIL Take 1 capsule (25 mg total) by mouth daily as needed. What changed: reasons to take this   lisinopril 10 MG tablet Commonly known as: ZESTRIL Take 10 mg by mouth daily.   Melatonin 10 MG Tabs Take 10 mg by mouth at bedtime.   metFORMIN 500 MG tablet Commonly known as: GLUCOPHAGE Take 1 tablet (500 mg total) by mouth 2 (two) times daily.   pantoprazole 40 MG tablet Commonly known as: PROTONIX Take 1 tablet (40 mg total) by mouth 2 (two) times daily before a meal.   venlafaxine XR 150 MG 24 hr capsule Commonly known as: EFFEXOR-XR Take 1 capsule (150 mg total) by mouth daily with breakfast.      No Known Allergies  The results of significant diagnostics from this hospitalization (including imaging, microbiology, ancillary and laboratory) are listed below for reference.    Significant Diagnostic Studies: CT RENAL STONE STUDY  Result Date: 05/13/2019 CLINICAL DATA:  Dysuria and fever EXAM: CT ABDOMEN AND PELVIS WITHOUT CONTRAST TECHNIQUE: Multidetector CT imaging of the abdomen and pelvis was performed following the standard protocol without IV contrast. COMPARISON:  None. FINDINGS: Lower chest: Lung bases demonstrate no acute consolidation or effusion. The heart size is normal Hepatobiliary: Hepatic steatosis. No calcified gallstone or biliary dilatation. Fat sparing near the gallbladder fossa Pancreas: Unremarkable. No pancreatic ductal dilatation or surrounding inflammatory  changes. Spleen: Normal in size without focal abnormality. Adrenals/Urinary Tract: Adrenal glands are normal. No hydronephrosis. Left perinephric fat stranding. 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 Stomach/Bowel: Stomach is within normal limits. Appendix appears normal. No evidence of bowel wall thickening, distention, or inflammatory changes. Vascular/Lymphatic: No significant vascular findings are present. No enlarged abdominal or pelvic lymph nodes. Reproductive: Uterus and bilateral adnexa are unremarkable. Other: No free air or free fluid. 14 mm low-density peripherally calcified mass in the posterior right pelvis. Musculoskeletal: No acute or significant osseous findings. IMPRESSION: 1. Negative for hydronephrosis or ureteral stone. Mild left perinephric fat stranding, nonspecific, but could be seen in the setting of pyelonephritis. 2. 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 Hepatic steatosis with fat sparing near the gallbladder fossa 4. 14 mm peripherally calcified mass in the right posterior pelvis, indeterminate, but possibly representing an area of fat necrosis. Electronically  Signed   By: Jasmine Pang M.D.   On: 05/13/2019 18:11    Microbiology: Recent Results (from the past 240 hour(s))  Urine culture     Status: Abnormal (Preliminary result)   Collection Time: 05/13/19  4:32 PM   Specimen: Urine, Random  Result Value Ref Range Status   Specimen Description   Final    URINE, RANDOM Performed at [facility] Day, [address], [place], [place] [address]    Special Requests   Final    NONE Performed at [facility] Breanna Day, [address] [place], [place] [address]    Culture (A)  Final    >=100,000 COLONIES/mL ESCHERICHIA COLI SUSCEPTIBILITIES TO FOLLOW Performed at [facility], [address] [place], [place] [address]     Report Status PENDING  Incomplete     Labs: Basic Metabolic Panel: Recent Labs  Lab 05/13/19 1632 05/14/19 0525 05/15/19 0410  NA 133* 137 139  K 4.3 3.9 4.0  CL 99 105 104  CO2 25 25 25   GLUCOSE 161* 181* 136*  BUN 12 10 8   CREATININE 0.69 0.59 0.57  CALCIUM 8.6* 8.3* 8.5*   Liver Function Tests: Recent Labs  Lab 05/13/19 1632  AST 18  ALT 24  ALKPHOS 82  BILITOT 0.6  PROT 7.7  ALBUMIN 3.9   No results for input(s): LIPASE, AMYLASE in the last 168 hours. No results for input(s): AMMONIA in the last 168 hours. CBC: Recent Labs  Lab 05/13/19 1632 05/14/19 0525 05/15/19 0410  WBC 16.9* 15.3* 11.3*  HGB 12.8 11.5* 11.4*  HCT 38.8 34.6* 34.8*  MCV 85.1 84.4 86.4  PLT 348 301 326   Cardiac Enzymes: No results for input(s): CKTOTAL, CKMB, CKMBINDEX, TROPONINI in the last 168 hours. BNP: BNP (last 3 results) No results for input(s): BNP in the last 8760 hours.  ProBNP (last 3 results) No results for input(s): PROBNP in the last 8760 hours.  CBG: Recent Labs  Lab 05/14/19 1147 05/14/19 1630 05/14/19 2229 05/15/19 0734 05/15/19 1148  GLUCAP 179* 143* 136* 143* 155*    Principal Problem:   Pyelonephritis Active Problems:   Gastroesophageal reflux disease without esophagitis   Anxiety and depression   Sepsis (HCC)   Type 2 diabetes mellitus without complication (HCC)   Time coordinating discharge: 38 minutes  Signed:        Aubrie Lucien, DO [facility]  05/15/2019, 6:45 PM

## 2019-05-15 NOTE — Progress Notes (Signed)
Breanna Day and O x4. VSS. Pt tolerating diet well. No complaints of nausea or vomiting. IV removed intact, prescriptions given. Pt voices understanding of discharge instructions with no further questions. Patient discharged, wanted to walk out  Allergies as of 05/15/2019   No Known Allergies     Medication List    TAKE these medications   aspirin EC 81 MG tablet Take 81 mg by mouth daily.   atorvastatin 10 MG tablet Commonly known as: LIPITOR Take 1 tablet (10 mg total) by mouth daily.   Biotin 10 MG Tabs Take 10 mg by mouth daily.   cefdinir 300 MG capsule Commonly known as: OMNICEF Take 1 capsule (300 mg total) by mouth 2 (two) times daily.   hydrOXYzine 25 MG capsule Commonly known as: VISTARIL Take 1 capsule (25 mg total) by mouth daily as needed. What changed: reasons to take this   lisinopril 10 MG tablet Commonly known as: ZESTRIL Take 10 mg by mouth daily.   Melatonin 10 MG Tabs Take 10 mg by mouth at bedtime.   metFORMIN 500 MG tablet Commonly known as: GLUCOPHAGE Take 1 tablet (500 mg total) by mouth 2 (two) times daily.   pantoprazole 40 MG tablet Commonly known as: PROTONIX Take 1 tablet (40 mg total) by mouth 2 (two) times daily before Day meal.   venlafaxine XR 150 MG 24 hr capsule Commonly known as: EFFEXOR-XR Take 1 capsule (150 mg total) by mouth daily with breakfast.       Vitals:   05/15/19 0434 05/15/19 0908  BP: 127/75 129/74  Pulse: 95   Resp: 20   Temp: 98.6 F (37 C)   SpO2: 95%     Breanna Day C Breanna Day

## 2019-05-16 LAB — URINE CULTURE: Culture: >=100000 — AB

## 2019-05-29 ENCOUNTER — Encounter: Payer: Self-pay | Admitting: Internal Medicine

## 2019-05-29 ENCOUNTER — Other Ambulatory Visit: Payer: Self-pay

## 2019-05-29 ENCOUNTER — Ambulatory Visit (INDEPENDENT_AMBULATORY_CARE_PROVIDER_SITE_OTHER): Payer: BC Managed Care – PPO | Admitting: Internal Medicine

## 2019-05-29 VITALS — BP 126/78 | HR 90 | Temp 98.0°F | Ht 62.0 in | Wt 205.0 lb

## 2019-05-29 DIAGNOSIS — E1169 Type 2 diabetes mellitus with other specified complication: Secondary | ICD-10-CM | POA: Insufficient documentation

## 2019-05-29 DIAGNOSIS — E7800 Pure hypercholesterolemia, unspecified: Secondary | ICD-10-CM | POA: Diagnosis not present

## 2019-05-29 DIAGNOSIS — F419 Anxiety disorder, unspecified: Secondary | ICD-10-CM

## 2019-05-29 DIAGNOSIS — Z0000 Encounter for general adult medical examination without abnormal findings: Secondary | ICD-10-CM | POA: Diagnosis not present

## 2019-05-29 DIAGNOSIS — K219 Gastro-esophageal reflux disease without esophagitis: Secondary | ICD-10-CM | POA: Diagnosis not present

## 2019-05-29 DIAGNOSIS — E119 Type 2 diabetes mellitus without complications: Secondary | ICD-10-CM | POA: Diagnosis not present

## 2019-05-29 DIAGNOSIS — E785 Hyperlipidemia, unspecified: Secondary | ICD-10-CM | POA: Insufficient documentation

## 2019-05-29 DIAGNOSIS — I10 Essential (primary) hypertension: Secondary | ICD-10-CM

## 2019-05-29 DIAGNOSIS — F329 Major depressive disorder, single episode, unspecified: Secondary | ICD-10-CM

## 2019-05-29 MED ORDER — BUPROPION HCL ER (XL) 150 MG PO TB24: 150.0000 mg | ORAL_TABLET | Freq: Every day | ORAL | 3 refills | Status: DC

## 2019-05-29 NOTE — Assessment & Plan Note (Signed)
A1C recently done No urine microalbumin secondary to ACEI therapy Encouraged her to consume a low carb diet, exercise for weight loss Continue Metformin Encouraged yearly eye exam She declines pneumovax today Flu shot UTD

## 2019-05-29 NOTE — Assessment & Plan Note (Signed)
Continue Lisinopril CMET today 

## 2019-05-29 NOTE — Assessment & Plan Note (Signed)
Will add Wellbutrin to her current regimen Continue Effexor and Hydroxyzine Support offered

## 2019-05-29 NOTE — Assessment & Plan Note (Signed)
CMET and Lipid profile today She did not start Atorvastatin

## 2019-05-29 NOTE — Patient Instructions (Signed)

## 2019-05-29 NOTE — Assessment & Plan Note (Signed)
Continue Pantoprazole CBC, CMET today

## 2019-05-29 NOTE — Progress Notes (Signed)
Subjective:    Patient ID: Breanna Day, female    DOB: [birth year], 54 y.o.   MRN: [id number]  HPI  Pt presents to the clinic today for her annual exam. She is also due to follow up chronic conditions.  Anxiety and Depression: She reports worsening depression. Managed on Venlafaxine and Hydroxyzine. She is not seeing a therapist. She denies SI/HI.  GERD: Triggered by spicy and tomato based foods. She denies breakthrough on Pantoprazole. There is no upper GI on file.  DM 2: Her last A1C was 7.8%, 05/2019. She is taking Metformin as prescribed. She does not check her sugars. She checks her feet routinely.  HTN: Her BP today is 126/78. She is taking Lisinopril as prescribed. There is no ECG on file.   HLD: Her last LDL was 150, 01/2019. She never started Atorvastatin. She has been trying to consume a low fat diet.  Flu: 12/2018 Tetanus: < 10 years ago per her report Pneumovax: never Covid: 05/24/19 Pap Smear: 12/2017 Mammogram: 12/2017 Colon Screening: never Vision Screening: annually Dentist: biannually  Diet: She does eat meat. She consumes fruits and veggies daily. She tries to avoid fried foods.  She drinks mostly water. Exercise: None  Review of Systems      Past Medical History:  Diagnosis Date  . Allergy   . Chicken pox   . Depression   . Diabetes mellitus without complication ([place])   . Hypertension     Current Outpatient Medications  Medication Sig Dispense Refill  . aspirin EC 81 MG tablet Take 81 mg by mouth daily.    Marland Kitchen atorvastatin (LIPITOR) 10 MG tablet Take 1 tablet (10 mg total) by mouth daily. 30 tablet 0  . Biotin 10 MG TABS Take 10 mg by mouth daily.    . cefdinir (OMNICEF) 300 MG capsule Take 1 capsule (300 mg total) by mouth 2 (two) times daily. 16 capsule 0  . hydrOXYzine (VISTARIL) 25 MG capsule Take 1 capsule (25 mg total) by mouth daily as needed. (Patient taking differently: Take 25 mg by mouth daily as needed for itching. ) 30 capsule 0  .  lisinopril (ZESTRIL) 10 MG tablet Take 10 mg by mouth daily.    . Melatonin 10 MG TABS Take 10 mg by mouth at bedtime.    . metFORMIN (GLUCOPHAGE) 500 MG tablet Take 1 tablet (500 mg total) by mouth 2 (two) times daily. 180 tablet 0  . pantoprazole (PROTONIX) 40 MG tablet Take 1 tablet (40 mg total) by mouth 2 (two) times daily before a meal. 60 tablet 2  . venlafaxine XR (EFFEXOR-XR) 150 MG 24 hr capsule Take 1 capsule (150 mg total) by mouth daily with breakfast. 90 capsule 0   No current facility-administered medications for this visit.    No Known Allergies  Family History  Problem Relation Age of Onset  . Hypertension Mother   . Arthritis Mother   . Hyperlipidemia Mother   . Hypertension Father   . Arthritis Father   . Hyperlipidemia Father   . Diabetes Maternal Grandmother   . Heart disease Maternal Grandmother   . Arthritis Maternal Grandmother   . Hypertension Maternal Grandmother   . Cancer Maternal Grandfather   . Heart disease Maternal Grandfather   . Arthritis Maternal Grandfather   . Hypertension Maternal Grandfather   . Diabetes Paternal Grandmother   . Cancer Paternal Grandmother   . Arthritis Paternal Grandmother   . Hypertension Paternal Grandmother   . Diabetes Paternal Grandfather   .  Heart disease Paternal Grandfather   . Arthritis Paternal Grandfather   . Hypertension Paternal Grandfather   . Cancer Maternal Uncle        Colon  . Breast cancer Paternal Aunt 43    Social History   Socioeconomic History  . Marital status: Married    Spouse name: Not on file  . Number of children: 2  . Years of education: Not on file  . Highest education level: Some college, no degree  Occupational History  . Not on file  Tobacco Use  . Smoking status: Never Smoker  . Smokeless tobacco: Never Used  Substance and Sexual Activity  . Alcohol use: No    Alcohol/week: 0.0 standard drinks  . Drug use: No  . Sexual activity: Yes  Other Topics Concern  . Not on  file  Social History Narrative  . Not on file   Social Determinants of Health   Financial Resource Strain:   . Difficulty of Paying Living Expenses:   Food Insecurity:   . Worried About Programme researcher, broadcasting/film/video in the Last Year:   . Barista in the Last Year:   Transportation Needs:   . Freight forwarder (Medical):   Marland Kitchen Lack of Transportation (Non-Medical):   Physical Activity:   . Days of Exercise per Week:   . Minutes of Exercise per Session:   Stress:   . Feeling of Stress :   Social Connections:   . Frequency of Communication with Friends and Family:   . Frequency of Social Gatherings with Friends and Family:   . Attends Religious Services:   . Active Member of Clubs or Organizations:   . Attends Banker Meetings:   Marland Kitchen Marital Status:   Intimate Partner Violence:   . Fear of Current or Ex-Partner:   . Emotionally Abused:   Marland Kitchen Physically Abused:   . Sexually Abused:      Constitutional: Denies fever, malaise, fatigue, headache or abrupt weight changes.  HEENT: Denies eye pain, eye redness, ear pain, ringing in the ears, wax buildup, runny nose, nasal congestion, bloody nose, or sore throat. Respiratory: Denies difficulty breathing, shortness of breath, cough or sputum production.   Cardiovascular: Denies chest pain, chest tightness, palpitations or swelling in the hands or feet.  Gastrointestinal: Denies abdominal pain, bloating, constipation, diarrhea or blood in the stool.  GU: Denies urgency, frequency, pain with urination, burning sensation, blood in urine, odor or discharge. Musculoskeletal: Denies decrease in range of motion, difficulty with gait, muscle pain or joint pain and swelling.  Skin: Denies redness, rashes, lesions or ulcercations.  Neurological: Denies dizziness, difficulty with memory, difficulty with speech or problems with balance and coordination.  Psych: Pt has a history of anxiety and depression. Denies SI/HI.  No other specific  complaints in a complete review of systems (except as listed in HPI above).  Objective:   Physical Exam  BP 126/78   Pulse 90   Temp 98 F (36.7 C) (Temporal)   Ht 5\' 2"  (1.575 m)   Wt 205 lb (93 kg)   LMP 02/01/2016 Comment: irregular  BMI 37.49 kg/m   Wt Readings from Last 3 Encounters:  05/13/19 207 lb 0.2 oz (93.9 kg)  01/15/19 212 lb (96.2 kg)  01/01/19 209 lb (94.8 kg)    General: Appears her stated age, obese, in NAD. Skin: Warm, dry and intact. No rashes, lesions or ulcerations noted. HEENT: Head: normal shape and size; Eyes: sclera white, no icterus, conjunctiva  pink, PERRLA and EOMs intact;  Neck:  Neck supple, trachea midline. No masses, lumps or thyromegaly present.  Cardiovascular: Normal rate and rhythm. S1,S2 noted.  No murmur, rubs or gallops noted. No JVD or BLE edema. No carotid bruits noted. Pulmonary/Chest: Normal effort and positive vesicular breath sounds. No respiratory distress. No wheezes, rales or ronchi noted.  Abdomen: Soft and nontender. Normal bowel sounds. No distention or masses noted. Liver, spleen and kidneys non palpable. Musculoskeletal: Strength 5/5 BUE/BLE. No difficulty with gait.  Neurological: Alert and oriented. Cranial nerves II-XII grossly intact. Coordination normal.  Psychiatric: Mood and affect normal. Behavior is normal. Judgment and thought content normal.     BMET    Component Value Date/Time   NA 139 05/15/2019 0410   K 4.0 05/15/2019 0410   CL 104 05/15/2019 0410   CO2 25 05/15/2019 0410   GLUCOSE 136 (H) 05/15/2019 0410   BUN 8 05/15/2019 0410   CREATININE 0.57 05/15/2019 0410   CALCIUM 8.5 (L) 05/15/2019 0410   GFRNONAA >60 05/15/2019 0410   GFRAA >60 05/15/2019 0410    Lipid Panel     Component Value Date/Time   CHOL 220 (H) 01/15/2019 1220   TRIG 212.0 (H) 01/15/2019 1220   HDL 48.70 01/15/2019 1220   CHOLHDL 5 01/15/2019 1220   VLDL 42.4 (H) 01/15/2019 1220    CBC    Component Value Date/Time   WBC  11.3 (H) 05/15/2019 0410   RBC 4.03 05/15/2019 0410   HGB 11.4 (L) 05/15/2019 0410   HCT 34.8 (L) 05/15/2019 0410   PLT 326 05/15/2019 0410   MCV 86.4 05/15/2019 0410   MCH 28.3 05/15/2019 0410   MCHC 32.8 05/15/2019 0410   RDW 12.5 05/15/2019 0410    Hgb A1C Lab Results  Component Value Date   HGBA1C 7.8 (H) 05/14/2019            Assessment & Plan:   Preventative Health Maintenance:  Flu shot UTD Tetanus UTD per her report Pneumovax- unable to get today due to recent Covid vaccine Pap smear UTD, we will request records. Mammogram due, she will call to schedule Colon screening- declined today Encouraged her to consume a balanced diet and exercise regimen Advised her to see an eye doctor and dentist annually Will check CBC, CMET, Lipid and Vit D today  RTC in 3 months, follow up chronic conditions Nicki Reaper, NP This visit occurred during the SARS-CoV-2 public health emergency.  Safety protocols were in place, including screening questions prior to the visit, additional usage of staff PPE, and extensive cleaning of exam room while observing appropriate contact time as indicated for disinfecting solutions.

## 2019-05-30 ENCOUNTER — Other Ambulatory Visit: Payer: Self-pay | Admitting: Internal Medicine

## 2019-05-30 DIAGNOSIS — Z1231 Encounter for screening mammogram for malignant neoplasm of breast: Secondary | ICD-10-CM

## 2019-05-30 LAB — VITAMIN D 25 HYDROXY (VIT D DEFICIENCY, FRACTURES): VITD: 17.66 ng/mL — ABNORMAL LOW (ref 30.00–100.00)

## 2019-05-30 LAB — LIPID PANEL
Cholesterol: 206 mg/dL — ABNORMAL HIGH (ref 0–200)
HDL: 49.6 mg/dL (ref >39.00)
LDL Cholesterol: 129 mg/dL — ABNORMAL HIGH (ref 0–99)
NonHDL: 156.12
Total CHOL/HDL Ratio: 4
Triglycerides: 137 mg/dL (ref 0.0–149.0)
VLDL: 27.4 mg/dL (ref 0.0–40.0)

## 2019-05-30 LAB — CBC
HCT: 40 % (ref 36.0–46.0)
Hemoglobin: 13.3 g/dL (ref 12.0–15.0)
MCHC: 33.2 g/dL (ref 30.0–36.0)
MCV: 85.5 fl (ref 78.0–100.0)
Platelets: 447 10*3/uL — ABNORMAL HIGH (ref 150.0–400.0)
RBC: 4.68 Mil/uL (ref 3.87–5.11)
RDW: 14.1 % (ref 11.5–15.5)
WBC: 11.5 10*3/uL — ABNORMAL HIGH (ref 4.0–10.5)

## 2019-05-30 LAB — COMPREHENSIVE METABOLIC PANEL
ALT: 31 U/L (ref 0–35)
AST: 22 U/L (ref 0–37)
Albumin: 4 g/dL (ref 3.5–5.2)
Alkaline Phosphatase: 85 U/L (ref 39–117)
BUN: 15 mg/dL (ref 6–23)
CO2: 28 mEq/L (ref 19–32)
Calcium: 9.3 mg/dL (ref 8.4–10.5)
Chloride: 99 mEq/L (ref 96–112)
Creatinine, Ser: 0.7 mg/dL (ref 0.40–1.20)
GFR: 87.22 mL/min (ref >60.00)
Glucose, Bld: 119 mg/dL — ABNORMAL HIGH (ref 70–99)
Potassium: 4.2 mEq/L (ref 3.5–5.1)
Sodium: 136 mEq/L (ref 135–145)
Total Bilirubin: 0.3 mg/dL (ref 0.2–1.2)
Total Protein: 7.5 g/dL (ref 6.0–8.3)

## 2019-06-03 ENCOUNTER — Encounter: Payer: Self-pay | Admitting: Internal Medicine

## 2019-06-03 DIAGNOSIS — E559 Vitamin D deficiency, unspecified: Secondary | ICD-10-CM

## 2019-06-03 MED ORDER — VITAMIN D (ERGOCALCIFEROL) 1.25 MG (50000 UNIT) PO CAPS: 50000.0000 [IU] | ORAL_CAPSULE | ORAL | 0 refills | Status: DC

## 2019-06-07 ENCOUNTER — Encounter: Payer: Self-pay | Admitting: Internal Medicine

## 2019-06-07 MED ORDER — HYDROXYZINE PAMOATE 25 MG PO CAPS: 25.0000 mg | ORAL_CAPSULE | Freq: Every day | ORAL | 2 refills | Status: DC | PRN

## 2019-06-07 NOTE — Addendum Note (Signed)
Addended by: Roena Malady on: 06/07/2019 02:29 PM   Modules accepted: Orders

## 2019-06-26 ENCOUNTER — Ambulatory Visit
Admission: RE | Admit: 2019-06-26 | Discharge: 2019-06-26 | Disposition: A | Discharge status: Home / Self Care | Payer: BC Managed Care – PPO | Source: Ambulatory Visit | Attending: Internal Medicine | Admitting: Internal Medicine

## 2019-06-26 DIAGNOSIS — Z1231 Encounter for screening mammogram for malignant neoplasm of breast: Secondary | ICD-10-CM | POA: Insufficient documentation

## 2019-08-05 ENCOUNTER — Other Ambulatory Visit: Payer: Self-pay | Admitting: Internal Medicine

## 2019-08-06 ENCOUNTER — Other Ambulatory Visit: Payer: Self-pay | Admitting: Internal Medicine

## 2019-08-06 DIAGNOSIS — K219 Gastro-esophageal reflux disease without esophagitis: Secondary | ICD-10-CM

## 2019-08-29 ENCOUNTER — Other Ambulatory Visit: Payer: Self-pay | Admitting: Internal Medicine

## 2019-09-04 ENCOUNTER — Other Ambulatory Visit: Payer: Self-pay | Admitting: Internal Medicine

## 2019-09-04 ENCOUNTER — Other Ambulatory Visit (INDEPENDENT_AMBULATORY_CARE_PROVIDER_SITE_OTHER): Payer: BC Managed Care – PPO

## 2019-09-04 DIAGNOSIS — E559 Vitamin D deficiency, unspecified: Secondary | ICD-10-CM | POA: Diagnosis not present

## 2019-09-04 LAB — VITAMIN D 25 HYDROXY (VIT D DEFICIENCY, FRACTURES): VITD: 35.29 ng/mL (ref 30.00–100.00)

## 2019-09-06 ENCOUNTER — Other Ambulatory Visit: Payer: Self-pay | Admitting: Internal Medicine

## 2019-09-06 DIAGNOSIS — E559 Vitamin D deficiency, unspecified: Secondary | ICD-10-CM

## 2019-09-12 ENCOUNTER — Other Ambulatory Visit: Payer: Self-pay | Admitting: Internal Medicine

## 2019-09-12 DIAGNOSIS — K219 Gastro-esophageal reflux disease without esophagitis: Secondary | ICD-10-CM

## 2019-10-10 ENCOUNTER — Other Ambulatory Visit: Payer: Self-pay | Admitting: Internal Medicine

## 2019-10-11 ENCOUNTER — Other Ambulatory Visit: Payer: Self-pay | Admitting: Internal Medicine

## 2019-10-24 ENCOUNTER — Other Ambulatory Visit: Payer: Self-pay | Admitting: Internal Medicine

## 2019-10-25 ENCOUNTER — Encounter: Payer: Self-pay | Admitting: Internal Medicine

## 2019-11-19 ENCOUNTER — Encounter: Payer: Self-pay | Admitting: Family Medicine

## 2019-11-19 ENCOUNTER — Other Ambulatory Visit: Payer: Self-pay

## 2019-11-19 ENCOUNTER — Ambulatory Visit (INDEPENDENT_AMBULATORY_CARE_PROVIDER_SITE_OTHER): Payer: BC Managed Care – PPO | Admitting: Family Medicine

## 2019-11-19 VITALS — BP 120/80 | HR 95 | Temp 96.4°F | Ht 62.0 in | Wt 203.2 lb

## 2019-11-19 DIAGNOSIS — R0982 Postnasal drip: Secondary | ICD-10-CM | POA: Diagnosis not present

## 2019-11-19 DIAGNOSIS — L309 Dermatitis, unspecified: Secondary | ICD-10-CM | POA: Diagnosis not present

## 2019-11-19 DIAGNOSIS — E559 Vitamin D deficiency, unspecified: Secondary | ICD-10-CM | POA: Diagnosis not present

## 2019-11-19 DIAGNOSIS — L501 Idiopathic urticaria: Secondary | ICD-10-CM | POA: Insufficient documentation

## 2019-11-19 LAB — VITAMIN D 25 HYDROXY (VIT D DEFICIENCY, FRACTURES): VITD: 50.63 ng/mL (ref 30.00–100.00)

## 2019-11-19 MED ORDER — AZITHROMYCIN 250 MG PO TABS: ORAL_TABLET | ORAL | 0 refills | Status: DC

## 2019-11-19 MED ORDER — CLOBETASOL PROPIONATE 0.05 % EX CREA: 1.0000 "application " | TOPICAL_CREAM | Freq: Two times a day (BID) | CUTANEOUS | 0 refills | Status: DC

## 2019-11-19 NOTE — Progress Notes (Signed)
Subjective:    Patient ID: Breanna Day, female    DOB: [birth year], 54 y.o.   MRN: [id number]  This visit occurred during the SARS-CoV-2 public health emergency.  Safety protocols were in place, including screening questions prior to the visit, additional usage of staff PPE, and extensive cleaning of exam room while observing appropriate contact time as indicated for disinfecting solutions.    HPI 54 yo pt of [name] presents with several issues   Wt Readings from Last 3 Encounters:  11/19/19 203 lb 3.2 oz (92.2 kg)  05/29/19 205 lb (93 kg)  05/13/19 207 lb 0.2 oz (93.9 kg)   37.17 kg/m      Had cough/congestion for more than a month  She is covid immunized  (and had covid long before that)  It started with a uri (did not get tested for covid that time- was immunized and did not have a fever)   Now - congestion /cough from post nasal drip  occ prod cough  Thick mucous down back of throat  No sinus pain  No wheezing  She does have env allergies- worse in the spring  Uses nasal steroid spray and claritin   No fever    Eczema -needs clobetasol refill  She has it on her R lateral ankle right now  Started with a shoe that did not fit  Avoids hot water and harsh det   Due for vit D level  Last D level was 35.29 in June (that was imp)  Was inst to start 5000 iu daily with calcium   Patient Active Problem List   Diagnosis Date Noted  . Vitamin D deficiency 11/19/2019  . Eczema 11/19/2019  . PND (post-nasal drip) 11/19/2019  . HTN (hypertension) 05/29/2019  . HLD (hyperlipidemia) 05/29/2019  . Type 2 diabetes mellitus without complication ([facility]) 05/13/2019  . Gastroesophageal reflux disease without esophagitis 06/24/2014  . Anxiety and depression 06/24/2014   Past Medical History:  Diagnosis Date  . Allergy   . Chicken pox   . Depression   . Diabetes mellitus without complication ([facility])   . Hypertension    Past Surgical History:  Procedure Laterality Date  .  TUBAL LIGATION     Social History   Tobacco Use  . Smoking status: Never Smoker  . Smokeless tobacco: Never Used  Substance Use Topics  . Alcohol use: No    Alcohol/week: 0.0 standard drinks  . Drug use: No   Family History  Problem Relation Age of Onset  . Hypertension Mother   . Arthritis Mother   . Hyperlipidemia Mother   . Hypertension Father   . Arthritis Father   . Hyperlipidemia Father   . Diabetes Maternal Grandmother   . Heart disease Maternal Grandmother   . Arthritis Maternal Grandmother   . Hypertension Maternal Grandmother   . Cancer Maternal Grandfather   . Heart disease Maternal Grandfather   . Arthritis Maternal Grandfather   . Hypertension Maternal Grandfather   . Diabetes Paternal Grandmother   . Cancer Paternal Grandmother   . Arthritis Paternal Grandmother   . Hypertension Paternal Grandmother   . Diabetes Paternal Grandfather   . Heart disease Paternal Grandfather   . Arthritis Paternal Grandfather   . Hypertension Paternal Grandfather   . Cancer Maternal Uncle        Colon  . Breast cancer Paternal Aunt 6   Allergies  Allergen Reactions  . Sulfur Other (See Comments)    Muscle cramps  Current Outpatient Medications on File Prior to Visit  Medication Sig Dispense Refill  . aspirin EC 81 MG tablet Take 81 mg by mouth daily.    Marland Kitchen atorvastatin (LIPITOR) 10 MG tablet Take 1 tablet (10 mg total) by mouth daily. 30 tablet 0  . Biotin 10 MG TABS Take 10 mg by mouth daily.    Marland Kitchen buPROPion (WELLBUTRIN XL) 150 MG 24 hr tablet Take 1 tablet (150 mg total) by mouth daily. 90 tablet 3  . hydrOXYzine (ATARAX/VISTARIL) 25 MG tablet Take 1 capsule (25 mg total) by mouth daily as needed for itching. 30 tablet 0  . lisinopril (ZESTRIL) 10 MG tablet Take 10 mg by mouth daily.    . Melatonin 10 MG TABS Take 10 mg by mouth at bedtime.    . metFORMIN (GLUCOPHAGE) 500 MG tablet Take 1 tablet (500 mg total) by mouth 2 (two) times daily. 180 tablet 0  .  pantoprazole (PROTONIX) 40 MG tablet Take 1 tablet (40 mg total) by mouth 2 (two) times daily before a meal. 60 tablet 0  . venlafaxine XR (EFFEXOR-XR) 150 MG 24 hr capsule Take 1 capsule (150 mg total) by mouth daily with breakfast. 90 capsule 0  . Vitamin D, Ergocalciferol, (DRISDOL) 1.25 MG (50000 UNIT) CAPS capsule Take 1 capsule (50,000 Units total) by mouth every 7 (seven) days. 12 capsule 0   No current facility-administered medications on file prior to visit.    Review of Systems  Constitutional: Positive for fatigue. Negative for activity change, appetite change, fever and unexpected weight change.  HENT: Positive for postnasal drip, rhinorrhea, sinus pressure and sore throat. Negative for congestion, ear pain and sinus pain.   Eyes: Negative for pain, redness and visual disturbance.  Respiratory: Negative for cough, shortness of breath and wheezing.   Cardiovascular: Negative for chest pain and palpitations.  Gastrointestinal: Negative for abdominal pain, blood in stool, constipation and diarrhea.  Endocrine: Negative for polydipsia and polyuria.  Genitourinary: Negative for dysuria, frequency and urgency.  Musculoskeletal: Negative for arthralgias, back pain and myalgias.  Skin: Positive for rash. Negative for pallor.  Allergic/Immunologic: Negative for environmental allergies.  Neurological: Negative for dizziness, syncope and headaches.  Hematological: Negative for adenopathy. Does not bruise/bleed easily.  Psychiatric/Behavioral: Negative for decreased concentration and dysphoric mood. The patient is not nervous/anxious.        Objective:   Physical Exam Constitutional:      General: She is not in acute distress.    Appearance: Normal appearance. She is well-developed. She is obese. She is not ill-appearing.  HENT:     Head: Normocephalic and atraumatic.     Comments: No facial tenderness    Right Ear: Tympanic membrane and external ear normal.     Left Ear: Tympanic  membrane and external ear normal.     Nose: Congestion and rhinorrhea present.     Mouth/Throat:     Pharynx: Oropharynx is clear. No oropharyngeal exudate or posterior oropharyngeal erythema.     Comments: Clear pnd  Voice is slightly hoarse Eyes:     General: No scleral icterus.       Right eye: No discharge.        Left eye: No discharge.     Conjunctiva/sclera: Conjunctivae normal.     Pupils: Pupils are equal, round, and reactive to light.  Cardiovascular:     Rate and Rhythm: Normal rate and regular rhythm.  Pulmonary:     Effort: Pulmonary effort is normal. No respiratory distress.  Breath sounds: Normal breath sounds. No wheezing or rales.     Comments: Good air exch No rales or rhonchi Musculoskeletal:     Cervical back: Normal range of motion and neck supple.  Lymphadenopathy:     Cervical: No cervical adenopathy.  Skin:    General: Skin is warm and dry.     Findings: No rash.     Comments: Area of erythema/induration and scale noted on R lateral ankle /lower leg  Dry but no excoriations noted   No drainage or signs of trauma   Neurological:     Mental Status: She is alert.     Cranial Nerves: No cranial nerve deficit.     Coordination: Coordination normal.     Deep Tendon Reflexes: Reflexes normal.  Psychiatric:        Mood and Affect: Mood normal.           Assessment & Plan:   Problem List Items Addressed This Visit      Musculoskeletal and Integument   Eczema    This comes and goes-now worse on lateral L ankle  Flared by mal fitting shoe  Uses moisturizers Refilled clobetasol cream to use prn (has worked well in the past)          Other   Vitamin D deficiency    Pt did start 5000 iu daily of D3  Last check in June 35.29 Would like to re check today  Disc imp to bone and overall health      Relevant Orders   VITAMIN D 25 Hydroxy (Vit-D Deficiency, Fractures) (Completed)   PND (post-nasal drip) - Primary    Purulent pnd/thick mucous  after having uri a month ago  Reassuring exam  Suspect possible early sinusitis/less likely bronchitis Not taking lisinopril - not cause a cough

## 2019-11-19 NOTE — Assessment & Plan Note (Signed)
Purulent pnd/thick mucous after having uri a month ago  Reassuring exam  Suspect possible early sinusitis/less likely bronchitis Not taking lisinopril - not cause a cough

## 2019-11-19 NOTE — Assessment & Plan Note (Signed)
This comes and goes-now worse on lateral L ankle  Flared by mal fitting shoe  Uses moisturizers Refilled clobetasol cream to use prn (has worked well in the past)

## 2019-11-19 NOTE — Assessment & Plan Note (Signed)
Pt did start 5000 iu daily of D3  Last check in June 35.29 Would like to re check today  Disc imp to bone and overall health

## 2019-11-19 NOTE — Patient Instructions (Addendum)
Drink lots of fluids  mucinex is helpful  Take zpak as directed   If no improvement let us know   Lab for vitamin D today

## 2019-11-21 ENCOUNTER — Other Ambulatory Visit: Payer: Self-pay | Admitting: Internal Medicine

## 2019-11-25 ENCOUNTER — Other Ambulatory Visit: Payer: Self-pay | Admitting: Internal Medicine

## 2019-11-25 DIAGNOSIS — K219 Gastro-esophageal reflux disease without esophagitis: Secondary | ICD-10-CM

## 2019-12-30 ENCOUNTER — Other Ambulatory Visit: Payer: Self-pay | Admitting: Internal Medicine

## 2020-01-06 ENCOUNTER — Other Ambulatory Visit: Payer: Self-pay | Admitting: Internal Medicine

## 2020-01-06 DIAGNOSIS — K219 Gastro-esophageal reflux disease without esophagitis: Secondary | ICD-10-CM

## 2020-01-17 ENCOUNTER — Telehealth: Payer: Self-pay | Admitting: Family Medicine

## 2020-01-17 ENCOUNTER — Encounter: Payer: Self-pay | Admitting: Family Medicine

## 2020-01-17 ENCOUNTER — Telehealth (INDEPENDENT_AMBULATORY_CARE_PROVIDER_SITE_OTHER): Payer: BC Managed Care – PPO | Admitting: Family Medicine

## 2020-01-17 ENCOUNTER — Other Ambulatory Visit (INDEPENDENT_AMBULATORY_CARE_PROVIDER_SITE_OTHER): Payer: BC Managed Care – PPO

## 2020-01-17 ENCOUNTER — Other Ambulatory Visit: Payer: Self-pay

## 2020-01-17 ENCOUNTER — Other Ambulatory Visit: Payer: Self-pay | Admitting: Family Medicine

## 2020-01-17 VITALS — Ht 62.01 in | Wt 200.0 lb

## 2020-01-17 DIAGNOSIS — M545 Low back pain, unspecified: Secondary | ICD-10-CM | POA: Insufficient documentation

## 2020-01-17 DIAGNOSIS — Z87448 Personal history of other diseases of urinary system: Secondary | ICD-10-CM

## 2020-01-17 DIAGNOSIS — R509 Fever, unspecified: Secondary | ICD-10-CM

## 2020-01-17 LAB — POCT URINALYSIS DIPSTICK
Bilirubin, UA: 1
Blood, UA: 10
Glucose, UA: NEGATIVE
Ketones, UA: 1
Leukocytes, UA: NEGATIVE
Nitrite, UA: NEGATIVE
Protein, UA: POSITIVE — AB
Spec Grav, UA: >=1.03 — AB (ref 1.010–1.025)
Urobilinogen, UA: NEGATIVE E.U./dL — AB
pH, UA: 6 (ref 5.0–8.0)

## 2020-01-17 LAB — POC INFLUENZA A&B (BINAX/QUICKVUE)
Influenza A, POC: NEGATIVE
Influenza B, POC: NEGATIVE

## 2020-01-17 MED ORDER — DOXYCYCLINE HYCLATE 100 MG PO TABS: 100.0000 mg | ORAL_TABLET | Freq: Two times a day (BID) | ORAL | 0 refills | Status: DC

## 2020-01-17 NOTE — Progress Notes (Signed)
Pt has already been called and scheduled for 2:45pm on 01/17/2020 for a drive up test for Covid, Flu, and Urine Culture. Carollee Herter has been made aware.

## 2020-01-17 NOTE — Addendum Note (Signed)
Addended by: Alvina Chou on: 01/17/2020 02:55 PM   Modules accepted: Orders

## 2020-01-17 NOTE — Assessment & Plan Note (Signed)
Likely COVID19  infection vs other viral infection given acute worsening and fever in last week. No SOB.  Will have patient come for COVID testing, rapid flu testing as well as cbc ( given green sputum and recurrent issues with infeciton in last year) Some concern for bacterial bronchitis/sinusitis as ? If symptoms are new or ineffectively treated symptoms from 11/2019. Did not respond to azithromycin and has had green nasal discharge since.  treat with doxycycline.Marland Kitchen given Friday will send in to Pharmacy prior to other tests results completed.     Pt moderate risk for COVID complications given  DM, obesity and age. If SOB begins symptoms worsening.. have low threshold for in-person exam, if severe shortness of breath ER visit recommended.  Can monitor Oxygen saturation at home with home monitor if able to obtain.  Go to ER if O2 sat < 90% on room air.  Reviewed home care and provided information through MyChart.  Recommended isolation until test returns. If returns positive 10 days quarantine recommended.  Provided info about prevention of spread of COVID 19.

## 2020-01-17 NOTE — Telephone Encounter (Signed)
Mychart message to pt.

## 2020-01-17 NOTE — Progress Notes (Signed)
VIRTUAL VISIT Due to national recommendations of social distancing due to COVID 19, a virtual visit is felt to be most appropriate for this patient at this time.   I connected with the patient on 01/17/20 at 10:00 AM EDT by virtual telehealth platform and verified that I am speaking with the correct person using two identifiers.   I discussed the limitations, risks, security and privacy concerns of performing an evaluation and management service by  virtual telehealth platform and the availability of in person appointments. I also discussed with the patient that there may be a patient responsible charge related to this service. The patient expressed understanding and agreed to proceed.  Patient location: Home Provider Location: [facility] [place] Participants: Kerby Nora and Hinton Dyer   Chief Complaint  Patient presents with  . Cough    pt c/o cough, chest congestion, Body aches, fever, sore throat x4-5days. sneezing, nasal congestion x1day    History of Present Illness:  54 year old patient of Nicki Reaper with history of DM, HTN, no chronic lung issues.. presents with new onset cough. Cough This is a new problem. The current episode started in the past 7 days (Symptom onset 01/13/20). The problem has been gradually worsening. The cough is productive of sputum. Associated symptoms include chills, ear congestion, ear pain, a fever, headaches, myalgias, nasal congestion, postnasal drip and a sore throat. Pertinent negatives include no chest pain, eye redness, rash, shortness of breath or wheezing. Associated symptoms comments: Fever 101.2 F in evenings  in last few days low back pain, severe..  No dysuria, no urgency.. The symptoms are aggravated by lying down. Risk factors: no smoker. Treatments tried: aleve, nyquil. The treatment provided mild relief. There is no history of asthma, bronchiectasis, bronchitis, COPD, emphysema, environmental allergies or pneumonia.   J/J vaccine in  05/2019   She had COVID in 2020 proven and  Possible unverified COVID over summer 2021.  She feels like she is sick off and on for 1 year. Seen grandson who started  kindergarten this year.   Does not feel like green sputum ever cleared from  Infection in 11/2019.Marland Kitchen given azithromycin.  Has not had COVID testing done yet.   No swelling ankles new.  COVID 19 screen No recent travel or known exposure to COVID19 The patient denies respiratory symptoms of COVID 19 at this time.  The importance of social distancing was discussed today.   Review of Systems  Constitutional: Positive for chills and fever.  HENT: Positive for ear pain, postnasal drip and sore throat. Negative for congestion.   Eyes: Negative for pain and redness.  Respiratory: Positive for cough. Negative for shortness of breath and wheezing.   Cardiovascular: Negative for chest pain, palpitations and leg swelling.  Gastrointestinal: Negative for abdominal pain, blood in stool, constipation, diarrhea, nausea and vomiting.  Genitourinary: Negative for dysuria.  Musculoskeletal: Positive for myalgias. Negative for falls.  Skin: Negative for rash.  Neurological: Positive for headaches. Negative for dizziness.  Endo/Heme/Allergies: Negative for environmental allergies.  Psychiatric/Behavioral: Negative for depression. The patient is not nervous/anxious.       Past Medical History:  Diagnosis Date  . Allergy   . Chicken pox   . Depression   . Diabetes mellitus without complication (HCC)   . Hypertension     reports that she has never smoked. She has never used smokeless tobacco. She reports that she does not drink alcohol and does not use drugs.   Current Outpatient Medications:  .  aspirin EC 81 MG tablet, Take 81 mg by mouth daily., Disp: , Rfl:  .  atorvastatin (LIPITOR) 10 MG tablet, Take 1 tablet (10 mg total) by mouth daily., Disp: 30 tablet, Rfl: 0 .  azithromycin (ZITHROMAX Z-PAK) 250 MG tablet, Take 2 pills by  mouth today and then 1 pill daily for 4 days, Disp: 6 tablet, Rfl: 0 .  Biotin 10 MG TABS, Take 10 mg by mouth daily., Disp: , Rfl:  .  buPROPion (WELLBUTRIN XL) 150 MG 24 hr tablet, Take 1 tablet (150 mg total) by mouth daily., Disp: 90 tablet, Rfl: 3 .  clobetasol cream (TEMOVATE) 0.05 %, Apply 1 application topically 2 (two) times daily. On affected area, Disp: 30 g, Rfl: 0 .  hydrOXYzine (ATARAX/VISTARIL) 25 MG tablet, Take 1 TABLET total) by mouth daily as needed for itching., Disp: 30 tablet, Rfl: 1 .  lisinopril (ZESTRIL) 10 MG tablet, Take 10 mg by mouth daily., Disp: , Rfl:  .  Melatonin 10 MG TABS, Take 10 mg by mouth at bedtime., Disp: , Rfl:  .  metFORMIN (GLUCOPHAGE) 500 MG tablet, Take 1 tablet (500 mg total) by mouth 2 (two) times daily., Disp: 180 tablet, Rfl: 0 .  pantoprazole (PROTONIX) 40 MG tablet, Take 1 tablet (40 mg total) by mouth 2 (two) times daily before a meal., Disp: 60 tablet, Rfl: 0 .  venlafaxine XR (EFFEXOR-XR) 150 MG 24 hr capsule, Take 1 capsule (150 mg total) by mouth daily with breakfast., Disp: 90 capsule, Rfl: 0 .  Vitamin D, Ergocalciferol, (DRISDOL) 1.25 MG (50000 UNIT) CAPS capsule, Take 1 capsule (50,000 Units total) by mouth every 7 (seven) days., Disp: 12 capsule, Rfl: 0   Observations/Objective: Height 5' 2.01" (1.575 m), weight 200 lb (90.7 kg), last menstrual period 02/01/2016.  Physical Exam  Physical Exam Constitutional:      General: The patient is not in acute distress. Constant moist cough Pulmonary:     Effort: Pulmonary effort is normal. No respiratory distress.  Neurological:     Mental Status: The patient is alert and oriented to person, place, and time.  Psychiatric:        Mood and Affect: Mood normal.        Behavior: Behavior normal.   Assessment and Plan   Fever  Likely COVID19  infection vs other viral infection given acute worsening and fever in last week. No SOB.  Will have patient come for COVID testing, rapid flu  testing as well as cbc ( given green sputum and recurrent issues with infeciton in last year) Some concern for bacterial bronchitis/sinusitis as ? If symptoms are new or ineffectively treated symptoms from 11/2019. Did not respond to azithromycin and has had green nasal discharge since.  treat with doxycycline.Marland Kitchen given Friday will send in to Pharmacy prior to other tests results completed.     Pt moderate risk for COVID complications given  DM, obesity and age. If SOB begins symptoms worsening.. have low threshold for in-person exam, if severe shortness of breath ER visit recommended.  Can monitor Oxygen saturation at home with home monitor if able to obtain.  Go to ER if O2 sat < 90% on room air.  Reviewed home care and provided information through MyChart.  Recommended isolation until test returns. If returns positive 10 days quarantine recommended.  Provided info about prevention of spread of COVID 19.   Acute midline low back pain without sciatica Pt with history of UTI and similar pain with UTI in  past... eval with UA and if positive proceed with Urine culture.     I discussed the assessment and treatment plan with the patient. The patient was provided an opportunity to ask questions and all were answered. The patient agreed with the plan and demonstrated an understanding of the instructions.   The patient was advised to call back or seek an in-person evaluation if the symptoms worsen or if the condition fails to improve as anticipated.     Kerby Nora, MD

## 2020-01-17 NOTE — Assessment & Plan Note (Signed)
Pt with history of UTI and similar pain with UTI in past... eval with UA and if positive proceed with Urine culture.

## 2020-01-18 LAB — CBC WITH DIFFERENTIAL/PLATELET
Absolute Monocytes: 1277 cells/uL — ABNORMAL HIGH (ref 200–950)
Basophils Absolute: 91 cells/uL (ref 0–200)
Basophils Relative: 0.6 %
Eosinophils Absolute: 912 cells/uL — ABNORMAL HIGH (ref 15–500)
Eosinophils Relative: 6 %
HCT: 38 % (ref 35.0–45.0)
Hemoglobin: 12.5 g/dL (ref 11.7–15.5)
Lymphs Abs: 3815 cells/uL (ref 850–3900)
MCH: 27.4 pg (ref 27.0–33.0)
MCHC: 32.9 g/dL (ref 32.0–36.0)
MCV: 83.2 fL (ref 80.0–100.0)
MPV: 10.3 fL (ref 7.5–12.5)
Monocytes Relative: 8.4 %
Neutro Abs: 9105 cells/uL — ABNORMAL HIGH (ref 1500–7800)
Neutrophils Relative %: 59.9 %
Platelets: 437 10*3/uL — ABNORMAL HIGH (ref 140–400)
RBC: 4.57 10*6/uL (ref 3.80–5.10)
RDW: 13.1 % (ref 11.0–15.0)
Total Lymphocyte: 25.1 %
WBC: 15.2 10*3/uL — ABNORMAL HIGH (ref 3.8–10.8)

## 2020-01-19 LAB — SARS-COV-2, NAA 2 DAY TAT

## 2020-01-19 LAB — NOVEL CORONAVIRUS, NAA: SARS-CoV-2, NAA: NOT DETECTED

## 2020-01-21 MED ORDER — GUAIFENESIN-CODEINE 100-10 MG/5ML PO SYRP
5.0000 mL | ORAL_SOLUTION | Freq: Every evening | ORAL | 0 refills | Status: DC | PRN
Start: 2020-01-21 — End: 2020-04-01

## 2020-01-21 NOTE — Addendum Note (Signed)
Addended by: Kerby Nora E on: 01/21/2020 02:34 PM   Modules accepted: Orders

## 2020-01-24 ENCOUNTER — Other Ambulatory Visit: Payer: Self-pay | Admitting: Internal Medicine

## 2020-02-10 ENCOUNTER — Other Ambulatory Visit: Payer: Self-pay | Admitting: Internal Medicine

## 2020-02-11 ENCOUNTER — Telehealth: Payer: Self-pay

## 2020-02-11 NOTE — Telephone Encounter (Signed)
Pt reports productive cough w/dark/bright blood and green phlegm. Pt reports small amounts of blood with dark blood in the PM and bright blood in the AM but more green than blood. Pt reports this started about 2 days after starting abx, around 11/27. Cough is better than before taking the abx but not a lot. Pt also c/o head congestion, HA, fatigue, some SOB at times due to congestion, not sleeping well and feeling like she is drowning due to the head congestion. Pt denies N/V/D, fever, or sore throat. Cough more productive since taking abx. Using OTC dayquil and nyquil, nasal spray and vicks, as well as codeine cough syrup prescribed at last VV. Advised pt seh should have VV. Pt agreed and is scheduled with PCP for tomorrow afternoon.  Advised if any symptoms worsen or she develops any new symptoms to contact this office. Advised of ER precautions. Pt verbalized understanding.

## 2020-02-11 NOTE — Telephone Encounter (Signed)
Will discuss at upcoming appt, agree with advice given 

## 2020-02-11 NOTE — Telephone Encounter (Signed)
[facility] Day - Client TELEPHONE ADVICE RECORD [facility]urse Patient Name: Charly Holcomb Villacres Gender: Female DOB: 17-Dec-1965 Age: 54 Y 7 M 26 D Return Phone Number: [phone] (Primary), [phone] (Secondary) Address: City/State/Zip: [place] [address] Client [facility] Day - Client Client Site [facility] - Day Physician Nicki Reaper - NP Contact Type Call Who Is Calling Patient / Member / Family / Caregiver Call Type Triage / Clinical Relationship To Patient Self Return Phone Number [phone] (Primary) Chief Complaint Coughing Up Blood Reason for Call Symptomatic / Request for Health Information Initial Comment Transferred from answering service, PT was seen recently, Covid test is negative, coughing up blood with green mucus. Translation No Disp. Time Lamount Cohen Time) Disposition Final User 02/11/2020 9:55:31 AM Attempt made - message left Newhart, RN, [facility] 02/11/2020 9:56:44 AM Attempt made - no message left Newhart, RN, [facility] 02/11/2020 10:06:58 AM FINAL ATTEMPT MADE - message left

## 2020-02-12 ENCOUNTER — Telehealth (INDEPENDENT_AMBULATORY_CARE_PROVIDER_SITE_OTHER): Payer: BC Managed Care – PPO | Admitting: Internal Medicine

## 2020-02-12 ENCOUNTER — Other Ambulatory Visit: Payer: Self-pay | Admitting: Internal Medicine

## 2020-02-12 ENCOUNTER — Other Ambulatory Visit: Payer: Self-pay

## 2020-02-12 DIAGNOSIS — J0191 Acute recurrent sinusitis, unspecified: Secondary | ICD-10-CM | POA: Diagnosis not present

## 2020-02-12 DIAGNOSIS — J209 Acute bronchitis, unspecified: Secondary | ICD-10-CM

## 2020-02-12 DIAGNOSIS — K219 Gastro-esophageal reflux disease without esophagitis: Secondary | ICD-10-CM

## 2020-02-12 MED ORDER — PREDNISONE 10 MG PO TABS: ORAL_TABLET | ORAL | 0 refills | Status: DC

## 2020-02-12 MED ORDER — CLARITHROMYCIN 500 MG PO TABS: 500.0000 mg | ORAL_TABLET | Freq: Two times a day (BID) | ORAL | 0 refills | Status: DC

## 2020-02-12 NOTE — Progress Notes (Signed)
Virtual Visit via Video Note  I connected with Breanna Day on 02/12/20 at  3:00 PM EST by a video enabled telemedicine application and verified that I am speaking with the correct person using two identifiers.  Location: Patient: Home Provider: Office  Person's participating in this video call: Nicki Reaper, NP-C and Lyfe Reihl.  I discussed the limitations of evaluation and management by telemedicine and the availability of in person appointments. The patient expressed understanding and agreed to proceed.  History of Present Illness:  Pt reports headache, nasal congestion and cough. This started 1 month ago. The headache is located in her temples. She describes the pain as throbbing. It is intermittent She is not blowing anything out of her nose. The cough is productive of blood tinged green mucous. She reports intermittent SOB. She denies dizziness, visual changes, ear pain, runny nose, loss of taste/smell, or sore throat. She denies fever, chills or body aches. She has tried Dayquil and RX cough syrup. She was seen 1 month ago for similar issues, prescribed Doxycycline which she reports improved her symptoms but they worsened again after taking the abx. She has had a negative Covid test and has not had known exposure.      Past Medical History:  Diagnosis Date  . Allergy   . Chicken pox   . Depression   . Diabetes mellitus without complication (HCC)   . Hypertension     Current Outpatient Medications  Medication Sig Dispense Refill  . aspirin EC 81 MG tablet Take 81 mg by mouth daily.    Marland Kitchen atorvastatin (LIPITOR) 10 MG tablet Take 1 tablet (10 mg total) by mouth daily. 30 tablet 0  . azithromycin (ZITHROMAX Z-PAK) 250 MG tablet Take 2 pills by mouth today and then 1 pill daily for 4 days 6 tablet 0  . Biotin 10 MG TABS Take 10 mg by mouth daily.    Marland Kitchen buPROPion (WELLBUTRIN XL) 150 MG 24 hr tablet Take 1 tablet (150 mg total) by mouth daily. 90 tablet 3  . clobetasol cream  (TEMOVATE) 0.05 % Apply 1 application topically 2 (two) times daily. On affected area 30 g 0  . doxycycline (VIBRA-TABS) 100 MG tablet Take 1 tablet (100 mg total) by mouth 2 (two) times daily. Fill if testing is negative 20 tablet 0  . guaiFENesin-codeine (ROBITUSSIN AC) 100-10 MG/5ML syrup Take 5-10 mLs by mouth at bedtime as needed for cough. 180 mL 0  . hydrOXYzine (ATARAX/VISTARIL) 25 MG tablet Take 1 TABLET total) by mouth daily as needed for itching. 30 tablet 0  . lisinopril (ZESTRIL) 10 MG tablet Take 10 mg by mouth daily.    . Melatonin 10 MG TABS Take 10 mg by mouth at bedtime.    . metFORMIN (GLUCOPHAGE) 500 MG tablet Take 1 tablet (500 mg total) by mouth 2 (two) times daily. 180 tablet 0  . pantoprazole (PROTONIX) 40 MG tablet Take 1 tablet (40 mg total) by mouth 2 (two) times daily before a meal. 60 tablet 0  . venlafaxine XR (EFFEXOR-XR) 150 MG 24 hr capsule Take 1 capsule (150 mg total) by mouth daily with breakfast. 90 capsule 0  . Vitamin D, Ergocalciferol, (DRISDOL) 1.25 MG (50000 UNIT) CAPS capsule Take 1 capsule (50,000 Units total) by mouth every 7 (seven) days. 12 capsule 0   No current facility-administered medications for this visit.    Allergies  Allergen Reactions  . Sulfur Other (See Comments)    Muscle cramps  Family History  Problem Relation Age of Onset  . Hypertension Mother   . Arthritis Mother   . Hyperlipidemia Mother   . Hypertension Father   . Arthritis Father   . Hyperlipidemia Father   . Diabetes Maternal Grandmother   . Heart disease Maternal Grandmother   . Arthritis Maternal Grandmother   . Hypertension Maternal Grandmother   . Cancer Maternal Grandfather   . Heart disease Maternal Grandfather   . Arthritis Maternal Grandfather   . Hypertension Maternal Grandfather   . Diabetes Paternal Grandmother   . Cancer Paternal Grandmother   . Arthritis Paternal Grandmother   . Hypertension Paternal Grandmother   . Diabetes Paternal  Grandfather   . Heart disease Paternal Grandfather   . Arthritis Paternal Grandfather   . Hypertension Paternal Grandfather   . Cancer Maternal Uncle        Colon  . Breast cancer Paternal Aunt 73    Social History   Socioeconomic History  . Marital status: Married    Spouse name: Not on file  . Number of children: 2  . Years of education: Not on file  . Highest education level: Some college, no degree  Occupational History  . Not on file  Tobacco Use  . Smoking status: Never Smoker  . Smokeless tobacco: Never Used  Substance and Sexual Activity  . Alcohol use: No    Alcohol/week: 0.0 standard drinks  . Drug use: No  . Sexual activity: Yes  Other Topics Concern  . Not on file  Social History Narrative  . Not on file   Social Determinants of Health   Financial Resource Strain:   . Difficulty of Paying Living Expenses: Not on file  Food Insecurity:   . Worried About Programme researcher, broadcasting/film/video in the Last Year: Not on file  . Ran Out of Food in the Last Year: Not on file  Transportation Needs:   . Lack of Transportation (Medical): Not on file  . Lack of Transportation (Non-Medical): Not on file  Physical Activity:   . Days of Exercise per Week: Not on file  . Minutes of Exercise per Session: Not on file  Stress:   . Feeling of Stress : Not on file  Social Connections:   . Frequency of Communication with Friends and Family: Not on file  . Frequency of Social Gatherings with Friends and Family: Not on file  . Attends Religious Services: Not on file  . Active Member of Clubs or Organizations: Not on file  . Attends Banker Meetings: Not on file  . Marital Status: Not on file  Intimate Partner Violence:   . Fear of Current or Ex-Partner: Not on file  . Emotionally Abused: Not on file  . Physically Abused: Not on file  . Sexually Abused: Not on file     Constitutional: Pt reports headache. Denies fever, malaise, fatigue, or abrupt weight changes.  HEENT:  Pt reports nasal congestion. Denies eye pain, eye redness, ear pain, ringing in the ears, wax buildup, runny nose, bloody nose, or sore throat. Respiratory: Pt reports cough or SOB. Denies difficulty breathing.   Cardiovascular: Denies chest pain, chest tightness, palpitations or swelling in the hands or feet.   No other specific complaints in a complete review of systems (except as listed in HPI above).    Observations/Objective:  LMP 02/01/2016 Comment: irregular Wt Readings from Last 3 Encounters:  01/17/20 200 lb (90.7 kg)  11/19/19 203 lb 3.2 oz (92.2 kg)  05/29/19 205 lb (93 kg)    General: Appears her stated age, obese, in NAD. HEENT: Head: normal shape and size; Nose: congestion noted; Throat/Mouth: no hoarseness noted.  Pulmonary/Chest: Normal effort. No respiratory distress.  Neurological: Alert and oriented.   BMET    Component Value Date/Time   NA 136 05/29/2019 1627   K 4.2 05/29/2019 1627   CL 99 05/29/2019 1627   CO2 28 05/29/2019 1627   GLUCOSE 119 (H) 05/29/2019 1627   BUN 15 05/29/2019 1627   CREATININE 0.70 05/29/2019 1627   CALCIUM 9.3 05/29/2019 1627   GFRNONAA >60 05/15/2019 0410   GFRAA >60 05/15/2019 0410    Lipid Panel     Component Value Date/Time   CHOL 206 (H) 05/29/2019 1627   TRIG 137.0 05/29/2019 1627   HDL 49.60 05/29/2019 1627   CHOLHDL 4 05/29/2019 1627   VLDL 27.4 05/29/2019 1627   LDLCALC 129 (H) 05/29/2019 1627    CBC    Component Value Date/Time   WBC 15.2 (H) 01/17/2020 1456   RBC 4.57 01/17/2020 1456   HGB 12.5 01/17/2020 1456   HCT 38.0 01/17/2020 1456   PLT 437 (H) 01/17/2020 1456   MCV 83.2 01/17/2020 1456   MCH 27.4 01/17/2020 1456   MCHC 32.9 01/17/2020 1456   RDW 13.1 01/17/2020 1456   LYMPHSABS 3,815 01/17/2020 1456   EOSABS 912 (H) 01/17/2020 1456   BASOSABS 91 01/17/2020 1456    Hgb A1C Lab Results  Component Value Date   HGBA1C 7.8 (H) 05/14/2019       Assessment and Plan:  Resistant  Sinusitis/Bronchitis:  RX for Pred Taper x 6 days RX for Clarithromycin 500 mg BID x 10 days Continue RX cough syrup as needed If no improvement, would want to see her in office for further evaluation and chest xray  Return precautions discussed  Follow Up Instructions:    I discussed the assessment and treatment plan with the patient. The patient was provided an opportunity to ask questions and all were answered. The patient agreed with the plan and demonstrated an understanding of the instructions.   The patient was advised to call back or seek an in-person evaluation if the symptoms worsen or if the condition fails to improve as anticipated.    Nicki Reaper, NP

## 2020-02-13 ENCOUNTER — Encounter: Payer: Self-pay | Admitting: Internal Medicine

## 2020-02-13 NOTE — Patient Instructions (Signed)

## 2020-02-27 ENCOUNTER — Ambulatory Visit (INDEPENDENT_AMBULATORY_CARE_PROVIDER_SITE_OTHER)
Admission: RE | Admit: 2020-02-27 | Discharge: 2020-02-27 | Disposition: A | Discharge status: Home / Self Care | Payer: BC Managed Care – PPO | Source: Ambulatory Visit | Attending: Internal Medicine | Admitting: Internal Medicine

## 2020-02-27 ENCOUNTER — Other Ambulatory Visit: Payer: Self-pay

## 2020-02-27 ENCOUNTER — Ambulatory Visit: Payer: BC Managed Care – PPO | Admitting: Internal Medicine

## 2020-02-27 ENCOUNTER — Encounter: Payer: Self-pay | Admitting: Internal Medicine

## 2020-02-27 VITALS — BP 122/80 | HR 103 | Temp 97.0°F | Wt 203.0 lb

## 2020-02-27 DIAGNOSIS — R059 Cough, unspecified: Secondary | ICD-10-CM

## 2020-02-27 LAB — CBC
HCT: 40.4 % (ref 36.0–46.0)
Hemoglobin: 13 g/dL (ref 12.0–15.0)
MCHC: 32.2 g/dL (ref 30.0–36.0)
MCV: 84.2 fl (ref 78.0–100.0)
Platelets: 412 10*3/uL — ABNORMAL HIGH (ref 150.0–400.0)
RBC: 4.8 Mil/uL (ref 3.87–5.11)
RDW: 14.3 % (ref 11.5–15.5)
WBC: 11.4 10*3/uL — ABNORMAL HIGH (ref 4.0–10.5)

## 2020-02-27 LAB — BASIC METABOLIC PANEL
BUN: 12 mg/dL (ref 6–23)
CO2: 29 mEq/L (ref 19–32)
Calcium: 9.1 mg/dL (ref 8.4–10.5)
Chloride: 100 mEq/L (ref 96–112)
Creatinine, Ser: 0.71 mg/dL (ref 0.40–1.20)
GFR: 96.2 mL/min (ref >60.00)
Glucose, Bld: 222 mg/dL — ABNORMAL HIGH (ref 70–99)
Potassium: 4.2 mEq/L (ref 3.5–5.1)
Sodium: 136 mEq/L (ref 135–145)

## 2020-02-27 NOTE — Patient Instructions (Signed)

## 2020-02-27 NOTE — Progress Notes (Signed)
Subjective:    Patient ID: Breanna Day, female    DOB: 12-Mar-1966, 54 y.o.   MRN: [id number]  HPI  Pt presents to the clinic today with c/o intermittent headaches, watery eyes, ear pain, runny nose, nasal congestion, sore throat, cough and SOB. This has been an ongoing issue for the last 4-5 weeks. The headaches are all over her head, she thinks it is coming from coughing. She is blowing clear mucous out of her nose. She describes the ear pain as fullness, without loss of hearing. She denies difficulty swallowing. The cough is productive of green mucous. She intermittently has mild shortness of breath. She was seen 11/5 for the same, started on Doxycycline. She was seen 12/1 again, given RX for Clarithromycin and Prednisone. She has completed both of the prescriptions. She denies fever, chills or body aches but has been fatigued. She takes like nightime cold and flu liquigels. She does not smoke. She has no history of asthma. She did get her Covid vaccine  Review of Systems      Past Medical History:  Diagnosis Date  . Allergy   . Chicken pox   . Depression   . Diabetes mellitus without complication (HCC)   . Hypertension     Current Outpatient Medications  Medication Sig Dispense Refill  . aspirin EC 81 MG tablet Take 81 mg by mouth daily.    Marland Kitchen atorvastatin (LIPITOR) 10 MG tablet Take 1 tablet (10 mg total) by mouth daily. 30 tablet 0  . Biotin 10 MG TABS Take 10 mg by mouth daily.    Marland Kitchen buPROPion (WELLBUTRIN XL) 150 MG 24 hr tablet Take 1 tablet (150 mg total) by mouth daily. 90 tablet 3  . clarithromycin (BIAXIN) 500 MG tablet Take 1 tablet (500 mg total) by mouth 2 (two) times daily. 20 tablet 0  . clobetasol cream (TEMOVATE) 0.05 % Apply 1 application topically 2 (two) times daily. On affected area 30 g 0  . guaiFENesin-codeine (ROBITUSSIN AC) 100-10 MG/5ML syrup Take 5-10 mLs by mouth at bedtime as needed for cough. 180 mL 0  . hydrOXYzine (ATARAX/VISTARIL) 25 MG tablet Take 1  TABLET total) by mouth daily as needed for itching. 30 tablet 0  . lisinopril (ZESTRIL) 10 MG tablet Take 10 mg by mouth daily.    . Melatonin 10 MG TABS Take 10 mg by mouth at bedtime.    . metFORMIN (GLUCOPHAGE) 500 MG tablet Take 1 tablet (500 mg total) by mouth 2 (two) times daily. 180 tablet 0  . pantoprazole (PROTONIX) 40 MG tablet Take 1 tablet (40 mg total) by mouth 2 (two) times daily before a meal. 60 tablet 1  . predniSONE (DELTASONE) 10 MG tablet Take 6 tabs day 1, 5 tabs day 2, 4 tabs day 3, 3 tabs day 4, 2 tabs day 5, 1 tab day 6 21 tablet 0  . venlafaxine XR (EFFEXOR-XR) 150 MG 24 hr capsule Take 1 capsule (150 mg total) by mouth daily with breakfast. 90 capsule 0  . Vitamin D, Ergocalciferol, (DRISDOL) 1.25 MG (50000 UNIT) CAPS capsule Take 1 capsule (50,000 Units total) by mouth every 7 (seven) days. 12 capsule 0   No current facility-administered medications for this visit.    Allergies  Allergen Reactions  . Sulfur Other (See Comments)    Muscle cramps     Family History  Problem Relation Age of Onset  . Hypertension Mother   . Arthritis Mother   . Hyperlipidemia Mother   .  Hypertension Father   . Arthritis Father   . Hyperlipidemia Father   . Diabetes Maternal Grandmother   . Heart disease Maternal Grandmother   . Arthritis Maternal Grandmother   . Hypertension Maternal Grandmother   . Cancer Maternal Grandfather   . Heart disease Maternal Grandfather   . Arthritis Maternal Grandfather   . Hypertension Maternal Grandfather   . Diabetes Paternal Grandmother   . Cancer Paternal Grandmother   . Arthritis Paternal Grandmother   . Hypertension Paternal Grandmother   . Diabetes Paternal Grandfather   . Heart disease Paternal Grandfather   . Arthritis Paternal Grandfather   . Hypertension Paternal Grandfather   . Cancer Maternal Uncle        Colon  . Breast cancer Paternal Aunt 10    Social History   Socioeconomic History  . Marital status: Married     Spouse name: Not on file  . Number of children: 2  . Years of education: Not on file  . Highest education level: Some college, no degree  Occupational History  . Not on file  Tobacco Use  . Smoking status: Never Smoker  . Smokeless tobacco: Never Used  Substance and Sexual Activity  . Alcohol use: No    Alcohol/week: 0.0 standard drinks  . Drug use: No  . Sexual activity: Yes  Other Topics Concern  . Not on file  Social History Narrative  . Not on file   Social Determinants of Health   Financial Resource Strain: Not on file  Food Insecurity: Not on file  Transportation Needs: Not on file  Physical Activity: Not on file  Stress: Not on file  Social Connections: Not on file  Intimate Partner Violence: Not on file     Constitutional: Pt reports fatigue, headache. Denies fever, malaise, or abrupt weight changes.  HEENT: Pt reports runny nose, nasal congestion, ear fullness, sore throat. Denies eye pain, eye redness, ear pain, ringing in the ears, wax buildup, bloody nose. Respiratory: Pt reports cough, shortness of breath. Denies difficulty breathing.   Cardiovascular: Denies chest pain, chest tightness, palpitations or swelling in the hands or feet.    No other specific complaints in a complete review of systems (except as listed in HPI above).  Objective:   Physical Exam BP 122/80   Pulse (!) 103   Temp (!) 97 F (36.1 C) (Temporal)   Wt 203 lb (92.1 kg)   LMP 02/01/2016 Comment: irregular  SpO2 98%   BMI 37.12 kg/m   Wt Readings from Last 3 Encounters:  01/17/20 200 lb (90.7 kg)  11/19/19 203 lb 3.2 oz (92.2 kg)  05/29/19 205 lb (93 kg)    General: Appears her stated age, obese in NAD. HEENT: Head: normal shape and size; Eyes: sclera white, no icterus, conjunctiva pink, PERRLA and EOMs intact; Ears: Tm's gray and intact, normal light reflex, + serous effusion on the left; Teeth present, mucosa pink and moist, no exudate, lesions or ulcerations noted.  Neck:   No adenopathy noted. Cardiovascular: Tachycardic with normal rhythm. S1,S2 noted.  No murmur, rubs or gallops noted.  Pulmonary/Chest: Normal effort and positive vesicular breath sounds. No respiratory distress. No wheezes, rales or ronchi noted.  Neurological: Alert and oriented.   BMET    Component Value Date/Time   NA 136 05/29/2019 1627   K 4.2 05/29/2019 1627   CL 99 05/29/2019 1627   CO2 28 05/29/2019 1627   GLUCOSE 119 (H) 05/29/2019 1627   BUN 15 05/29/2019 1627  CREATININE 0.70 05/29/2019 1627   CALCIUM 9.3 05/29/2019 1627   GFRNONAA >60 05/15/2019 0410   GFRAA >60 05/15/2019 0410    Lipid Panel     Component Value Date/Time   CHOL 206 (H) 05/29/2019 1627   TRIG 137.0 05/29/2019 1627   HDL 49.60 05/29/2019 1627   CHOLHDL 4 05/29/2019 1627   VLDL 27.4 05/29/2019 1627   LDLCALC 129 (H) 05/29/2019 1627    CBC    Component Value Date/Time   WBC 15.2 (H) 01/17/2020 1456   RBC 4.57 01/17/2020 1456   HGB 12.5 01/17/2020 1456   HCT 38.0 01/17/2020 1456   PLT 437 (H) 01/17/2020 1456   MCV 83.2 01/17/2020 1456   MCH 27.4 01/17/2020 1456   MCHC 32.9 01/17/2020 1456   RDW 13.1 01/17/2020 1456   LYMPHSABS 3,815 01/17/2020 1456   EOSABS 912 (H) 01/17/2020 1456   BASOSABS 91 01/17/2020 1456    Hgb A1C Lab Results  Component Value Date   HGBA1C 7.8 (H) 05/14/2019           Assessment & Plan:   URI with Cough:  Some improvement with Prednisone, none really with abx Chest xray today Will check CBC, BMET today No indication for additional abx or steroids at this time Start Zyrtec and Flonase OTC, continue nightly Hydroxyzine  Will follow up after labs and xray, return precautions discussed Nicki Reaper, NP This visit occurred during the SARS-CoV-2 public health emergency.  Safety protocols were in place, including screening questions prior to the visit, additional usage of staff PPE, and extensive cleaning of exam room while observing appropriate contact  time as indicated for disinfecting solutions.

## 2020-02-28 ENCOUNTER — Encounter: Payer: Self-pay | Admitting: Internal Medicine

## 2020-03-02 ENCOUNTER — Other Ambulatory Visit: Payer: Self-pay

## 2020-03-02 ENCOUNTER — Ambulatory Visit: Payer: BC Managed Care – PPO | Admitting: Primary Care

## 2020-03-02 VITALS — BP 118/78 | HR 100 | Temp 96.0°F | Ht 62.0 in | Wt 202.0 lb

## 2020-03-02 DIAGNOSIS — R350 Frequency of micturition: Secondary | ICD-10-CM | POA: Diagnosis not present

## 2020-03-02 LAB — POC URINALSYSI DIPSTICK (AUTOMATED)
Bilirubin, UA: POSITIVE
Blood, UA: POSITIVE
Glucose, UA: NEGATIVE
Nitrite, UA: NEGATIVE
Protein, UA: POSITIVE — AB
Spec Grav, UA: 1.02 (ref 1.010–1.025)
Urobilinogen, UA: 0.2 E.U./dL
pH, UA: 6 (ref 5.0–8.0)

## 2020-03-02 MED ORDER — NITROFURANTOIN MONOHYD MACRO 100 MG PO CAPS: 100.0000 mg | ORAL_CAPSULE | Freq: Two times a day (BID) | ORAL | 0 refills | Status: AC

## 2020-03-02 NOTE — Assessment & Plan Note (Signed)
Acute for the last 24 hours, history of cystitis. UA today with 3+ leuks, 1+ blood. Culture sent.  Given history coupled with symptoms, will treat. Rx for Macrobid sent to pharmacy. Will adjust if needed based from culture results.

## 2020-03-02 NOTE — Patient Instructions (Signed)
Start Macrobid (nitrofurantoin) tablets for urinary tract infection. Take 1 tablet by mouth twice daily for five days.  Ensure you are consuming 64 ounces of water daily.  It was a pleasure meeting you!

## 2020-03-02 NOTE — Progress Notes (Signed)
Subjective:    Patient ID: Breanna Day, female    DOB: October 15, 1965, 53 y.o.   MRN: [id number]  HPI  This visit occurred during the SARS-CoV-2 public health emergency.  Safety protocols were in place, including screening questions prior to the visit, additional usage of staff PPE, and extensive cleaning of exam room while observing appropriate contact time as indicated for disinfecting solutions.   Breanna Day is a 54 year old female patient of Nicki Reaper with a history of hypertension, type 2 diabetes, hyperlipidemia, acute cystitis who presents today with a chief complaint of urinary frequency.  She also reports suprapubic pressure. She denies hematuria, dysuria, vaginal itching/discharge/bleeding. Symptoms began yesterday. She's taken some AZO cranberry and Aleve today. She has a history of acute cystitis in the past, this feels similar. She has been off and on several antibiotics this year for chest congestion and cough that has been present since her Covid-19 infection one year ago. Last course of antibiotics was Doxycycline in November 2021.  Review of Systems  Constitutional: Negative for fever.  Gastrointestinal: Negative for abdominal pain.  Genitourinary: Positive for frequency. Negative for dysuria, hematuria, urgency, vaginal bleeding and vaginal discharge.       Past Medical History:  Diagnosis Date   Allergy    Chicken pox    Depression    Diabetes mellitus without complication (HCC)    Hypertension      Social History   Socioeconomic History   Marital status: Married    Spouse name: Not on file   Number of children: 2   Years of education: Not on file   Highest education level: Some college, no degree  Occupational History   Not on file  Tobacco Use   Smoking status: Never Smoker   Smokeless tobacco: Never Used  Substance and Sexual Activity   Alcohol use: No    Alcohol/week: 0.0 standard drinks   Drug use: No   Sexual activity: Yes  Other  Topics Concern   Not on file  Social History Narrative   Not on file   Social Determinants of Health   Financial Resource Strain: Not on file  Food Insecurity: Not on file  Transportation Needs: Not on file  Physical Activity: Not on file  Stress: Not on file  Social Connections: Not on file  Intimate Partner Violence: Not on file    Past Surgical History:  Procedure Laterality Date   TUBAL LIGATION      Family History  Problem Relation Age of Onset   Hypertension Mother    Arthritis Mother    Hyperlipidemia Mother    Hypertension Father    Arthritis Father    Hyperlipidemia Father    Diabetes Maternal Grandmother    Heart disease Maternal Grandmother    Arthritis Maternal Grandmother    Hypertension Maternal Grandmother    Cancer Maternal Grandfather    Heart disease Maternal Grandfather    Arthritis Maternal Grandfather    Hypertension Maternal Grandfather    Diabetes Paternal Grandmother    Cancer Paternal Grandmother    Arthritis Paternal Grandmother    Hypertension Paternal Grandmother    Diabetes Paternal Grandfather    Heart disease Paternal Grandfather    Arthritis Paternal Grandfather    Hypertension Paternal Grandfather    Cancer Maternal Uncle        Colon   Breast cancer Paternal Aunt 22    Allergies  Allergen Reactions   Sulfur Other (See Comments)    Muscle cramps  Current Outpatient Medications on File Prior to Visit  Medication Sig Dispense Refill   aspirin EC 81 MG tablet Take 81 mg by mouth daily.     atorvastatin (LIPITOR) 10 MG tablet Take 1 tablet (10 mg total) by mouth daily. 30 tablet 0   Biotin 10 MG TABS Take 10 mg by mouth daily.     buPROPion (WELLBUTRIN XL) 150 MG 24 hr tablet Take 1 tablet (150 mg total) by mouth daily. 90 tablet 3   clarithromycin (BIAXIN) 500 MG tablet Take 1 tablet (500 mg total) by mouth 2 (two) times daily. 20 tablet 0   clobetasol cream (TEMOVATE) 0.05 % Apply 1  application topically 2 (two) times daily. On affected area 30 g 0   guaiFENesin-codeine (ROBITUSSIN AC) 100-10 MG/5ML syrup Take 5-10 mLs by mouth at bedtime as needed for cough. 180 mL 0   hydrOXYzine (ATARAX/VISTARIL) 25 MG tablet Take 1 TABLET total) by mouth daily as needed for itching. 30 tablet 0   lisinopril (ZESTRIL) 10 MG tablet Take 10 mg by mouth daily.     Melatonin 10 MG TABS Take 10 mg by mouth at bedtime.     metFORMIN (GLUCOPHAGE) 500 MG tablet Take 1 tablet (500 mg total) by mouth 2 (two) times daily. 180 tablet 0   pantoprazole (PROTONIX) 40 MG tablet Take 1 tablet (40 mg total) by mouth 2 (two) times daily before a meal. 60 tablet 1   venlafaxine XR (EFFEXOR-XR) 150 MG 24 hr capsule Take 1 capsule (150 mg total) by mouth daily with breakfast. 90 capsule 0   No current facility-administered medications on file prior to visit.    BP 118/78    Pulse 100    Temp (!) 96 F (35.6 C) (Temporal)    Ht 5\' 2"  (1.575 m)    Wt 202 lb (91.6 kg)    LMP 02/01/2016 Comment: irregular   SpO2 97%    BMI 36.95 kg/m    Objective:   Physical Exam Constitutional:      Appearance: She is well-nourished.  Pulmonary:     Effort: Pulmonary effort is normal.  Abdominal:     Tenderness: There is no abdominal tenderness. There is no right CVA tenderness or left CVA tenderness.  Musculoskeletal:     Cervical back: Neck supple.  Skin:    General: Skin is warm and dry.  Psychiatric:        Mood and Affect: Mood and affect normal.            Assessment & Plan:

## 2020-03-05 LAB — URINE CULTURE
MICRO NUMBER:: 11338171
SPECIMEN QUALITY:: ADEQUATE

## 2020-03-09 ENCOUNTER — Other Ambulatory Visit: Payer: Self-pay | Admitting: Internal Medicine

## 2020-03-31 ENCOUNTER — Telehealth: Payer: Self-pay | Admitting: *Deleted

## 2020-03-31 NOTE — Telephone Encounter (Signed)
Pt called in to schedule appt for possible UTI, she said she only wants to see Nicki Reaper, NP, so appt scheduled tomorrow, pt said she recently had surgery and is unable to move/ come in office and said Rene Kocher is aware and requested appt to be virtual. I advised pt that we would require a urine drop off prior to appt if she is requesting virtual visit. Pt given instructions to drop off urine and verbalized understanding. Pt will drop of urine sample prior to appt  FYI to PCP

## 2020-03-31 NOTE — Telephone Encounter (Signed)
noted 

## 2020-04-01 ENCOUNTER — Telehealth (INDEPENDENT_AMBULATORY_CARE_PROVIDER_SITE_OTHER): Payer: Self-pay | Admitting: Internal Medicine

## 2020-04-01 ENCOUNTER — Other Ambulatory Visit: Payer: Self-pay

## 2020-04-01 ENCOUNTER — Encounter: Payer: Self-pay | Admitting: Internal Medicine

## 2020-04-01 DIAGNOSIS — R8290 Unspecified abnormal findings in urine: Secondary | ICD-10-CM

## 2020-04-01 DIAGNOSIS — R350 Frequency of micturition: Secondary | ICD-10-CM

## 2020-04-01 DIAGNOSIS — R3989 Other symptoms and signs involving the genitourinary system: Secondary | ICD-10-CM

## 2020-04-01 LAB — POC URINALSYSI DIPSTICK (AUTOMATED)
Bilirubin, UA: NEGATIVE
Glucose, UA: NEGATIVE
Nitrite, UA: NEGATIVE
Protein, UA: POSITIVE — AB
Spec Grav, UA: >=1.03 — AB (ref 1.010–1.025)
Urobilinogen, UA: 0.2 E.U./dL
pH, UA: 5 (ref 5.0–8.0)

## 2020-04-01 MED ORDER — CEPHALEXIN 500 MG PO CAPS
500.0000 mg | ORAL_CAPSULE | Freq: Two times a day (BID) | ORAL | 0 refills | Status: DC
Start: 2020-04-01 — End: 2020-09-28

## 2020-04-01 NOTE — Patient Instructions (Signed)
Urinary Tract Infection, Adult A urinary tract infection (UTI) is an infection of any part of the urinary tract. The urinary tract includes:  The kidneys.  The ureters.  The bladder.  The urethra. These organs make, store, and get rid of pee (urine) in the body. What are the causes? This infection is caused by germs (bacteria) in your genital area. These germs grow and cause swelling (inflammation) of your urinary tract. What increases the risk? The following factors may make you more likely to develop this condition:  Using a small, thin tube (catheter) to drain pee.  Not being able to control when you pee or poop (incontinence).  Being female. If you are female, these things can increase the risk: ? Using these methods to prevent pregnancy:  A medicine that kills sperm (spermicide).  A device that blocks sperm (diaphragm). ? Having low levels of a female hormone (estrogen). ? Being pregnant. You are more likely to develop this condition if:  You have genes that add to your risk.  You are sexually active.  You take antibiotic medicines.  You have trouble peeing because of: ? A prostate that is bigger than normal, if you are female. ? A blockage in the part of your body that drains pee from the bladder. ? A kidney stone. ? A nerve condition that affects your bladder. ? Not getting enough to drink. ? Not peeing often enough.  You have other conditions, such as: ? Diabetes. ? A weak disease-fighting system (immune system). ? Sickle cell disease. ? Gout. ? Injury of the spine. What are the signs or symptoms? Symptoms of this condition include:  Needing to pee right away.  Peeing small amounts often.  Pain or burning when peeing.  Blood in the pee.  Pee that smells bad or not like normal.  Trouble peeing.  Pee that is cloudy.  Fluid coming from the vagina, if you are female.  Pain in the belly or lower back. Other symptoms include:  Vomiting.  Not  feeling hungry.  Feeling mixed up (confused). This may be the first symptom in older adults.  Being tired and grouchy (irritable).  A fever.  Watery poop (diarrhea). How is this treated?  Taking antibiotic medicine.  Taking other medicines.  Drinking enough water. In some cases, you may need to see a specialist. Follow these instructions at home: Medicines  Take over-the-counter and prescription medicines only as told by your doctor.  If you were prescribed an antibiotic medicine, take it as told by your doctor. Do not stop taking it even if you start to feel better. General instructions  Make sure you: ? Pee until your bladder is empty. ? Do not hold pee for a long time. ? Empty your bladder after sex. ? Wipe from front to back after peeing or pooping if you are a female. Use each tissue one time when you wipe.  Drink enough fluid to keep your pee pale yellow.  Keep all follow-up visits.   Contact a doctor if:  You do not get better after 1-2 days.  Your symptoms go away and then come back. Get help right away if:  You have very bad back pain.  You have very bad pain in your lower belly.  You have a fever.  You have chills.  You feeling like you will vomit or you vomit. Summary  A urinary tract infection (UTI) is an infection of any part of the urinary tract.  This condition is caused by   germs in your genital area.  There are many risk factors for a UTI.  Treatment includes antibiotic medicines.  Drink enough fluid to keep your pee pale yellow. This information is not intended to replace advice given to you by your health care provider. Make sure you discuss any questions you have with your health care provider. Document Revised: 10/11/2019 Document Reviewed: 10/11/2019 Elsevier Patient Education  2021 [employer] Inc[employer]  

## 2020-04-01 NOTE — Progress Notes (Addendum)
Virtual Visit via Video Note  I connected with Breanna Day on 04/01/20 at  2:15 PM EST by a video enabled telemedicine application and verified that I am speaking with the correct person using two identifiers.  Location: Patient: Home Provider: Office   Person's participating in this video call: Nicki Reaper, NP-C and Ying Blankenhorn.  I discussed the limitations of evaluation and management by telemedicine and the availability of in person appointments. The patient expressed understanding and agreed to proceed.  History of Present Illness:  HPI  Pt reports bladder pressure, urinary frequency and odor. This started 4-5 days ago. She denies urgency, dysuria, blood in her urine, fever, chills, nausea or low back pain. She has taken 2 leftover Macrobid with some improvement in symptoms. She was treated for [facility] UTI 1 month ago with Macrobid.   Review of Systems  Past Medical History:  Diagnosis Date  . Allergy   . Chicken pox   . Depression   . Diabetes mellitus without complication (HCC)   . Hypertension     Family History  Problem Relation Age of Onset  . Hypertension Mother   . Arthritis Mother   . Hyperlipidemia Mother   . Hypertension Father   . Arthritis Father   . Hyperlipidemia Father   . Diabetes Maternal Grandmother   . Heart disease Maternal Grandmother   . Arthritis Maternal Grandmother   . Hypertension Maternal Grandmother   . Cancer Maternal Grandfather   . Heart disease Maternal Grandfather   . Arthritis Maternal Grandfather   . Hypertension Maternal Grandfather   . Diabetes Paternal Grandmother   . Cancer Paternal Grandmother   . Arthritis Paternal Grandmother   . Hypertension Paternal Grandmother   . Diabetes Paternal Grandfather   . Heart disease Paternal Grandfather   . Arthritis Paternal Grandfather   . Hypertension Paternal Grandfather   . Cancer Maternal Uncle        Colon  . Breast cancer Paternal Aunt 77    Social History   Socioeconomic  History  . Marital status: Married    Spouse name: Not on file  . Number of children: 2  . Years of education: Not on file  . Highest education level: Some college, no degree  Occupational History  . Not on file  Tobacco Use  . Smoking status: Never Smoker  . Smokeless tobacco: Never Used  Substance and Sexual Activity  . Alcohol use: No    Alcohol/week: 0.0 standard drinks  . Drug use: No  . Sexual activity: Yes  Other Topics Concern  . Not on file  Social History Narrative  . Not on file   Social Determinants of Health   Financial Resource Strain: Not on file  Food Insecurity: Not on file  Transportation Needs: Not on file  Physical Activity: Not on file  Stress: Not on file  Social Connections: Not on file  Intimate Partner Violence: Not on file    Allergies  Allergen Reactions  . Elemental Sulfur Other (See Comments)    Muscle cramps      Constitutional: Denies fever, malaise, fatigue, headache or abrupt weight changes.   GU: Pt reports bladder pressure, urinary frequency and odor. Denies burning sensation, urgency, blood in urine, or discharge.   No other specific complaints in a complete review of systems (except as listed in HPI above).    Objective:   Physical Exam   Wt Readings from Last 3 Encounters:  03/02/20 202 lb (91.6 kg)  02/27/20 203  lb (92.1 kg)  01/17/20 200 lb (90.7 kg)    General: Appears her stated age, well developed, well nourished in NAD. Pulmonary/Chest: Normal effor. No respiratory distress.         Assessment & Plan:   Urinary Frequency, Odor and Bladder Pressure:  Urinalysis: 1+ leuks, 1+ blood Will send urine culture eRx sent if for Keflex 500 mg BID x 5 days Drink plenty of fluids  RTC as needed or if symptoms persist. Nicki Reaper, NP   Follow Up Instructions:    I discussed the assessment and treatment plan with the patient. The patient was provided an opportunity to ask questions and all were answered. The  patient agreed with the plan and demonstrated an understanding of the instructions.   The patient was advised to call back or seek an in-person evaluation if the symptoms worsen or if the condition fails to improve as anticipated.     Nicki Reaper, NP

## 2020-04-02 LAB — URINE CULTURE
MICRO NUMBER:: 11433572
Result:: NO GROWTH
SPECIMEN QUALITY:: ADEQUATE

## 2020-04-07 ENCOUNTER — Other Ambulatory Visit: Payer: Self-pay | Admitting: Internal Medicine

## 2020-04-27 ENCOUNTER — Other Ambulatory Visit: Payer: Self-pay | Admitting: Internal Medicine

## 2020-04-27 DIAGNOSIS — K219 Gastro-esophageal reflux disease without esophagitis: Secondary | ICD-10-CM

## 2020-05-04 ENCOUNTER — Other Ambulatory Visit: Payer: Self-pay | Admitting: Internal Medicine

## 2020-06-01 ENCOUNTER — Other Ambulatory Visit: Payer: Self-pay | Admitting: Internal Medicine

## 2020-06-01 DIAGNOSIS — K219 Gastro-esophageal reflux disease without esophagitis: Secondary | ICD-10-CM

## 2020-06-02 ENCOUNTER — Other Ambulatory Visit: Payer: Self-pay | Admitting: Internal Medicine

## 2020-06-30 ENCOUNTER — Other Ambulatory Visit: Payer: Self-pay | Admitting: Internal Medicine

## 2020-06-30 DIAGNOSIS — K219 Gastro-esophageal reflux disease without esophagitis: Secondary | ICD-10-CM

## 2020-07-01 ENCOUNTER — Other Ambulatory Visit: Payer: Self-pay | Admitting: Internal Medicine

## 2020-07-13 ENCOUNTER — Other Ambulatory Visit: Payer: Self-pay | Admitting: Internal Medicine

## 2020-07-14 ENCOUNTER — Other Ambulatory Visit: Payer: Self-pay | Admitting: Internal Medicine

## 2020-07-17 ENCOUNTER — Other Ambulatory Visit: Payer: Self-pay | Admitting: Internal Medicine

## 2020-07-31 ENCOUNTER — Other Ambulatory Visit: Payer: Self-pay | Admitting: Internal Medicine

## 2020-07-31 NOTE — Telephone Encounter (Signed)
Requested medications are due for refill today.  yes  Requested medications are on the active medications list.  yes Last refill.   Future visit scheduled.   no  Notes to clinic.  Please advise. 

## 2020-08-03 ENCOUNTER — Other Ambulatory Visit: Payer: Self-pay | Admitting: Internal Medicine

## 2020-08-03 DIAGNOSIS — K219 Gastro-esophageal reflux disease without esophagitis: Secondary | ICD-10-CM

## 2020-08-12 NOTE — Telephone Encounter (Signed)
Refill request Hydroxyzine last refill 07/01/20 #30 Pantoprazole last refill 06/30/20 #60 Last office visit 04/01/20 acute No upcoming appointment scheduled with a PCP

## 2020-09-08 ENCOUNTER — Other Ambulatory Visit: Payer: Self-pay | Admitting: Internal Medicine

## 2020-09-08 DIAGNOSIS — K219 Gastro-esophageal reflux disease without esophagitis: Secondary | ICD-10-CM

## 2020-09-24 ENCOUNTER — Encounter: Payer: Self-pay | Admitting: Internal Medicine

## 2020-09-28 ENCOUNTER — Other Ambulatory Visit: Payer: Self-pay

## 2020-09-28 ENCOUNTER — Encounter: Payer: Self-pay | Admitting: Internal Medicine

## 2020-09-28 ENCOUNTER — Ambulatory Visit (INDEPENDENT_AMBULATORY_CARE_PROVIDER_SITE_OTHER): Payer: BC Managed Care – PPO | Admitting: Internal Medicine

## 2020-09-28 VITALS — BP 138/65 | HR 104 | Temp 97.4°F | Resp 18 | Ht 62.0 in | Wt 194.6 lb

## 2020-09-28 DIAGNOSIS — E1165 Type 2 diabetes mellitus with hyperglycemia: Secondary | ICD-10-CM

## 2020-09-28 DIAGNOSIS — L2082 Flexural eczema: Secondary | ICD-10-CM

## 2020-09-28 DIAGNOSIS — I10 Essential (primary) hypertension: Secondary | ICD-10-CM

## 2020-09-28 DIAGNOSIS — F419 Anxiety disorder, unspecified: Secondary | ICD-10-CM

## 2020-09-28 DIAGNOSIS — E7800 Pure hypercholesterolemia, unspecified: Secondary | ICD-10-CM

## 2020-09-28 DIAGNOSIS — F32A Depression, unspecified: Secondary | ICD-10-CM

## 2020-09-28 DIAGNOSIS — Z6835 Body mass index (BMI) 35.0-35.9, adult: Secondary | ICD-10-CM

## 2020-09-28 DIAGNOSIS — K219 Gastro-esophageal reflux disease without esophagitis: Secondary | ICD-10-CM

## 2020-09-28 DIAGNOSIS — R0683 Snoring: Secondary | ICD-10-CM

## 2020-09-28 DIAGNOSIS — E6609 Other obesity due to excess calories: Secondary | ICD-10-CM | POA: Insufficient documentation

## 2020-09-28 DIAGNOSIS — D75839 Thrombocytosis, unspecified: Secondary | ICD-10-CM | POA: Insufficient documentation

## 2020-09-28 MED ORDER — LISINOPRIL 10 MG PO TABS: 10.0000 mg | ORAL_TABLET | Freq: Every day | ORAL | 0 refills | Status: DC

## 2020-09-28 NOTE — Telephone Encounter (Signed)
See pt message, she did not know the name of the place she wanted to go at her visit but she has found it now.

## 2020-09-28 NOTE — Assessment & Plan Note (Signed)
Encourage diet and exercise for weight loss 

## 2020-09-28 NOTE — Assessment & Plan Note (Signed)
A1c and urine microalbumin today Encouraged her to consume a low-carb diet and exercise for weight loss Continue Metformin Encouraged her to make an appointment for an eye exam Encourage routine foot exams Encouraged her to get a flu shot in fall Encouraged her to get her COVID booster We will discuss pneumonia vaccine at next exam

## 2020-09-28 NOTE — Assessment & Plan Note (Signed)
Continue clobetasol as needed

## 2020-09-28 NOTE — Assessment & Plan Note (Signed)
C-Met and lipid profile today Encouraged her to consume a low-fat diet Will refill Atorvastatin if LDL greater than 100

## 2020-09-28 NOTE — Assessment & Plan Note (Signed)
CBC today.  

## 2020-09-28 NOTE — Assessment & Plan Note (Signed)
Stable on Venlafaxine, Wellbutrin and Hydroxyzine Support offered

## 2020-09-28 NOTE — Patient Instructions (Signed)
Sleep Apnea Sleep apnea affects breathing during sleep. It causes breathing to stop for 10 seconds or more, or to become shallow. People with sleep apnea usually snoreloudly. It can also increase the risk of: Heart attack. Stroke. Being very overweight (obese). Diabetes. Heart failure. Irregular heartbeat. High blood pressure. The goal of treatment is to help you breathe normally again. What are the causes?  The most common cause of this condition is a collapsed or blocked airway. There are three kinds of sleep apnea: Obstructive sleep apnea. This is caused by a blocked or collapsed airway. Central sleep apnea. This happens when the brain does not send the right signals to the muscles that control breathing. Mixed sleep apnea. This is a combination of obstructive and central sleep apnea. What increases the risk? Being overweight. Smoking. Having a small airway. Being older. Being female. Drinking alcohol. Taking medicines to calm yourself (sedatives or tranquilizers). Having family members with the condition. Having a tongue or tonsils that are larger than normal. What are the signs or symptoms? Trouble staying asleep. Loud snoring. Headaches in the morning. Waking up gasping. Dry mouth or sore throat in the morning. Being sleepy or tired during the day. If you are sleepy or tired during the day, you may also: Not be able to focus your mind (concentrate). Forget things. Get angry a lot and have mood swings. Feel sad (depressed). Have changes in your personality. Have less interest in sex, if you are female. Be unable to have an erection, if you are female. How is this treated?  Sleeping on your side. Using a medicine to get rid of mucus in your nose (decongestant). Avoiding the use of alcohol, medicines to help you relax, or certain pain medicines (narcotics). Losing weight, if needed. Changing your diet. Quitting smoking. Using a machine to open your airway while you  sleep, such as: An oral appliance. This is a mouthpiece that shifts your lower jaw forward. A CPAP device. This device blows air through a mask when you breathe out (exhale). An EPAP device. This has valves that you put in each nostril. A BPAP device. This device blows air through a mask when you breathe in (inhale) and breathe out. Having surgery if other treatments do not work. Follow these instructions at home: Lifestyle Make changes that your doctor recommends. Eat a healthy diet. Lose weight if needed. Avoid alcohol, medicines to help you relax, and some pain medicines. Do not smoke or use any products that contain nicotine or tobacco. If you need help quitting, ask your doctor. General instructions Take over-the-counter and prescription medicines only as told by your doctor. If you were given a machine to use while you sleep, use it only as told by your doctor. If you are having surgery, make sure to tell your doctor you have sleep apnea. You may need to bring your device with you. Keep all follow-up visits. Contact a doctor if: The machine that you were given to use during sleep bothers you or does not seem to be working. You do not get better. You get worse. Get help right away if: Your chest hurts. You have trouble breathing in enough air. You have an uncomfortable feeling in your back, arms, or stomach. You have trouble talking. One side of your body feels weak. A part of your face is hanging down. These symptoms may be an emergency. Get help right away. Call your local emergency services (911 in the [place] Do not wait to see if the symptoms   will go away. Do not drive yourself to the hospital. Summary This condition affects breathing during sleep. The most common cause is a collapsed or blocked airway. The goal of treatment is to help you breathe normally while you sleep. This information is not intended to replace advice given to you by your health care provider. Make  sure you discuss any questions you have with your healthcare provider. Document Revised: 02/07/2020 Document Reviewed: 02/07/2020 Elsevier Patient Education  2022 Elsevier Inc.  
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
## 2020-09-28 NOTE — Assessment & Plan Note (Signed)
Elevated today Lisinopril refilled Encourage DASH diet and exercise for weight loss C-Met today

## 2020-09-28 NOTE — Assessment & Plan Note (Signed)
Encouraged weight loss as this can help reduce reflux symptoms Continue Pantoprazole CBC and c-Met today

## 2020-09-29 LAB — TSH: TSH: 1.05 mIU/L

## 2020-09-29 LAB — LIPID PANEL
Cholesterol: 236 mg/dL — ABNORMAL HIGH (ref <200)
HDL: 61 mg/dL (ref >=50)
LDL Cholesterol (Calc): 128 mg/dL (calc) — ABNORMAL HIGH
Non-HDL Cholesterol (Calc): 175 mg/dL (calc) — ABNORMAL HIGH (ref <130)
Total CHOL/HDL Ratio: 3.9 (calc) (ref <5.0)
Triglycerides: 329 mg/dL — ABNORMAL HIGH (ref <150)

## 2020-09-29 LAB — COMPLETE METABOLIC PANEL WITH GFR
AG Ratio: 1.4 (calc) (ref 1.0–2.5)
ALT: 16 U/L (ref 6–29)
AST: 12 U/L (ref 10–35)
Albumin: 4.4 g/dL (ref 3.6–5.1)
Alkaline phosphatase (APISO): 87 U/L (ref 37–153)
BUN: 15 mg/dL (ref 7–25)
CO2: 29 mmol/L (ref 20–32)
Calcium: 9.7 mg/dL (ref 8.6–10.4)
Chloride: 101 mmol/L (ref 98–110)
Creat: 0.69 mg/dL (ref 0.50–1.03)
Globulin: 3.1 g/dL (calc) (ref 1.9–3.7)
Glucose, Bld: 192 mg/dL — ABNORMAL HIGH (ref 65–99)
Potassium: 4.8 mmol/L (ref 3.5–5.3)
Sodium: 137 mmol/L (ref 135–146)
Total Bilirubin: 0.3 mg/dL (ref 0.2–1.2)
Total Protein: 7.5 g/dL (ref 6.1–8.1)
eGFR: 102 mL/min/{1.73_m2} (ref >=60)

## 2020-09-29 LAB — MICROALBUMIN / CREATININE URINE RATIO
Creatinine, Urine: 293 mg/dL — ABNORMAL HIGH (ref 20–275)
Microalb Creat Ratio: 48 mcg/mg creat — ABNORMAL HIGH (ref <30)
Microalb, Ur: 14 mg/dL

## 2020-09-29 LAB — HEMOGLOBIN A1C
Hgb A1c MFr Bld: 8 % of total Hgb — ABNORMAL HIGH (ref <5.7)
Mean Plasma Glucose: 183 mg/dL
eAG (mmol/L): 10.1 mmol/L

## 2020-09-29 LAB — CBC
HCT: 41.6 % (ref 35.0–45.0)
Hemoglobin: 13.9 g/dL (ref 11.7–15.5)
MCH: 28.7 pg (ref 27.0–33.0)
MCHC: 33.4 g/dL (ref 32.0–36.0)
MCV: 86 fL (ref 80.0–100.0)
MPV: 9.3 fL (ref 7.5–12.5)
Platelets: 463 10*3/uL — ABNORMAL HIGH (ref 140–400)
RBC: 4.84 10*6/uL (ref 3.80–5.10)
RDW: 12.9 % (ref 11.0–15.0)
WBC: 10.4 10*3/uL (ref 3.8–10.8)

## 2020-10-06 NOTE — Telephone Encounter (Signed)
If we could, that's where she wants to be seen

## 2020-10-08 ENCOUNTER — Telehealth: Payer: Self-pay

## 2020-10-08 MED ORDER — METFORMIN HCL 1000 MG PO TABS: 1000.0000 mg | ORAL_TABLET | Freq: Two times a day (BID) | ORAL | 3 refills | Status: DC

## 2020-10-08 MED ORDER — LISINOPRIL 10 MG PO TABS: 10.0000 mg | ORAL_TABLET | Freq: Every day | ORAL | 3 refills | Status: DC

## 2020-10-08 MED ORDER — ATORVASTATIN CALCIUM 10 MG PO TABS: 10.0000 mg | ORAL_TABLET | Freq: Every day | ORAL | 3 refills | Status: DC

## 2020-10-08 NOTE — Telephone Encounter (Signed)
Metformin increased and sent over to the patient pharmacy per [facility] request.

## 2020-10-15 ENCOUNTER — Other Ambulatory Visit: Payer: Self-pay | Admitting: Family

## 2020-10-15 DIAGNOSIS — K219 Gastro-esophageal reflux disease without esophagitis: Secondary | ICD-10-CM

## 2020-10-19 ENCOUNTER — Other Ambulatory Visit: Payer: Self-pay | Admitting: Internal Medicine

## 2020-10-19 NOTE — Telephone Encounter (Signed)
Next OV 01/04/21 Approved per protocol.  Requested Prescriptions  Pending Prescriptions Disp Refills  . buPROPion (WELLBUTRIN XL) 150 MG 24 hr tablet [Pharmacy Med Name: BUPROPION HCL XL 150 MG TABLET] 90 tablet 0    Sig: Take 1 tablet (150 mg total) by mouth daily.     Psychiatry: Antidepressants - bupropion Passed - 10/19/2020 12:53 PM      Passed - Completed PHQ-2 or PHQ-9 in the last 360 days      Passed - Last BP in normal range    BP Readings from Last 1 Encounters:  09/28/20 138/65         Passed - Valid encounter within last 6 months    Recent Outpatient Visits          3 weeks ago Type 2 diabetes mellitus with hyperglycemia, without long-term current use of insulin [facility])   [facility], Salvadore Oxford, NP   8 years ago Low back pain   [facility], Mitchel Honour, DO      Future Appointments            In 2 months Baity, Salvadore Oxford, NP [facility]

## 2020-10-21 ENCOUNTER — Other Ambulatory Visit: Payer: Self-pay

## 2020-10-21 DIAGNOSIS — K219 Gastro-esophageal reflux disease without esophagitis: Secondary | ICD-10-CM

## 2020-10-28 ENCOUNTER — Other Ambulatory Visit: Payer: Self-pay

## 2020-10-28 DIAGNOSIS — K219 Gastro-esophageal reflux disease without esophagitis: Secondary | ICD-10-CM

## 2020-10-28 MED ORDER — PANTOPRAZOLE SODIUM 40 MG PO TBEC: 40.0000 mg | DELAYED_RELEASE_TABLET | Freq: Two times a day (BID) | ORAL | 10 refills | Status: DC

## 2020-10-30 ENCOUNTER — Telehealth: Payer: Self-pay

## 2020-10-30 NOTE — Telephone Encounter (Signed)
ATC patient in regards of sleep study.if patient calls back, I would like to know if she has had a sleep study before if not, nothing further is needed.

## 2020-11-02 ENCOUNTER — Other Ambulatory Visit: Payer: Self-pay | Admitting: Family

## 2020-11-03 ENCOUNTER — Ambulatory Visit (INDEPENDENT_AMBULATORY_CARE_PROVIDER_SITE_OTHER): Payer: BC Managed Care – PPO | Admitting: Adult Health

## 2020-11-03 ENCOUNTER — Other Ambulatory Visit: Payer: Self-pay

## 2020-11-03 ENCOUNTER — Encounter: Payer: Self-pay | Admitting: Adult Health

## 2020-11-03 VITALS — BP 130/80 | HR 89 | Temp 97.6°F | Ht 62.0 in | Wt 198.6 lb

## 2020-11-03 DIAGNOSIS — Z6835 Body mass index (BMI) 35.0-35.9, adult: Secondary | ICD-10-CM

## 2020-11-03 DIAGNOSIS — R400 Somnolence: Secondary | ICD-10-CM | POA: Diagnosis not present

## 2020-11-03 DIAGNOSIS — G4719 Other hypersomnia: Secondary | ICD-10-CM | POA: Insufficient documentation
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
## 2020-11-03 NOTE — Patient Instructions (Addendum)
Set up for split night sleep study .  Healthy sleep regimen Work on healthy weight loss Do not drive if sleepy Follow up in 6-8 weeks to discuss results .

## 2020-11-03 NOTE — Assessment & Plan Note (Signed)
Healthy weight loss discussed 

## 2020-11-03 NOTE — Assessment & Plan Note (Signed)
Excessive daytime sleepiness, restless sleep, snoring, gasping for air while sleeping.  All suspicious for underlying sleep apnea.  Patient has tried home sleep study through her dentist x2 but was unable to get any data due to taking the machine off multiple times.  We will go ahead and set her up for an in lab attended study with split-night. Patient education on sleep apnea. Patient education on sleep apnea and potential cardiovascular complications and treatment options including weight loss oral appliance and CPAP.  Plan  Patient Instructions  Set up for split night sleep study .  Healthy sleep regimen Work on healthy weight loss Do not drive if sleepy Follow up in 6-8 weeks to discuss results .     Marland Kitchen

## 2020-11-04 NOTE — Progress Notes (Signed)
Reviewed and agree with assessment/plan.   Coralyn Helling, MD [facility] Pulmonary/Critical Care 11/04/2020, 8:51 AM Pager:  [phone]

## 2020-11-18 ENCOUNTER — Other Ambulatory Visit: Admission: RE | Admit: 2020-11-18 | Payer: BC Managed Care – PPO | Source: Ambulatory Visit

## 2020-11-19 ENCOUNTER — Ambulatory Visit: Payer: BC Managed Care – PPO | Attending: Pulmonary Disease

## 2020-11-19 DIAGNOSIS — G4733 Obstructive sleep apnea (adult) (pediatric): Secondary | ICD-10-CM | POA: Insufficient documentation

## 2020-11-19 DIAGNOSIS — R400 Somnolence: Secondary | ICD-10-CM | POA: Diagnosis present

## 2020-11-23 ENCOUNTER — Other Ambulatory Visit: Payer: Self-pay

## 2020-11-30 ENCOUNTER — Telehealth (INDEPENDENT_AMBULATORY_CARE_PROVIDER_SITE_OTHER): Payer: Self-pay | Admitting: Pulmonary Disease

## 2020-11-30 DIAGNOSIS — G4733 Obstructive sleep apnea (adult) (pediatric): Secondary | ICD-10-CM

## 2020-11-30 NOTE — Telephone Encounter (Signed)
Split study showed severe  OSA with AHI 42/ hr Suggest CPAP  10 cm, F30i small wide mask

## 2020-12-01 NOTE — Telephone Encounter (Signed)
Needs ov to discuss results and treatment plan

## 2020-12-02 NOTE — Telephone Encounter (Signed)
Patient has a f/u scheduled for 12/29/20 at 9 am.  Nothing further needed.

## 2020-12-09 ENCOUNTER — Encounter: Payer: Self-pay | Admitting: Internal Medicine

## 2020-12-10 MED ORDER — MECLIZINE HCL 25 MG PO TABS: 25.0000 mg | ORAL_TABLET | Freq: Three times a day (TID) | ORAL | 0 refills | Status: DC | PRN

## 2020-12-10 MED ORDER — SCOPOLAMINE 1 MG/3DAYS TD PT72: 1.0000 | MEDICATED_PATCH | TRANSDERMAL | 0 refills | Status: DC

## 2020-12-29 ENCOUNTER — Other Ambulatory Visit: Payer: Self-pay

## 2020-12-29 ENCOUNTER — Encounter: Payer: Self-pay | Admitting: Adult Health

## 2020-12-29 ENCOUNTER — Ambulatory Visit (INDEPENDENT_AMBULATORY_CARE_PROVIDER_SITE_OTHER): Payer: BC Managed Care – PPO | Admitting: Adult Health

## 2020-12-29 VITALS — BP 130/60 | HR 105 | Temp 96.9°F | Ht 62.5 in | Wt 199.2 lb

## 2020-12-29 DIAGNOSIS — Z6835 Body mass index (BMI) 35.0-35.9, adult: Secondary | ICD-10-CM

## 2020-12-29 DIAGNOSIS — G4733 Obstructive sleep apnea (adult) (pediatric): Secondary | ICD-10-CM | POA: Insufficient documentation

## 2020-12-29 NOTE — Patient Instructions (Addendum)
Begin CPAP at bedtime Goal was to wear your CPAP all night long for at least 6 or more hours Work on healthy weight loss Do not drive if sleepy Follow-up in 3 months and as needed

## 2020-12-29 NOTE — Assessment & Plan Note (Signed)
Healthy weight loss discussed 

## 2020-12-29 NOTE — Assessment & Plan Note (Signed)
Severe obstructive sleep apnea-begin CPAP at bedtime.  Patient education given on sleep apnea and CPAP care. Begin CPAP 10 cm H2O.  Plan  Patient Instructions  Begin CPAP at bedtime Goal was to wear your CPAP all night long for at least 6 or more hours Work on healthy weight loss Do not drive if sleepy Follow-up in 3 months and as needed

## 2020-12-29 NOTE — Progress Notes (Signed)
@Patient  ID: , female    DOB: 1965-12-03, 55 y.o.   MRN: [id number]  Chief Complaint  Patient presents with   Follow-up    Referring provider: [id number], NP  HPI: 55 year old female seen for sleep consult November 03, 2020 for daytime sleepiness found to have severe obstructive sleep apnea Medical history significant for diabetes, hypertension and hyperlipidemia   TEST/EVENTS :  11/2020 Split study showed severe  OSA with AHI 42/ hr Suggest CPAP  10 cm, F30i small wide mask  12/29/2020 Follow up : OSA  Patient returns for a 27-month follow-up.  Patient was seen last visit for a sleep consult for daytime sleepiness.  She was set up for split-night sleep study done last month.  This showed severe sleep apnea with AHI at 42/hour.  She had optimal control on CPAP 10 cm H2O. We discussed her sleep study results and went over treatment options including weight loss, oral appliance and CPAP.  Patient would like to proceed with CPAP therapy.  Patient education on OSA and CPAP care.     Allergies  Allergen Reactions   Sulfa Antibiotics Nausea Only   Elemental Sulfur Other (See Comments)    Muscle cramps     Immunization History  Administered Date(s) Administered   Influenza,inj,Quad PF,6+ Mos 12/28/2015, 12/25/2018   Influenza-Unspecified 01/08/2020   Janssen (J&J) SARS-COV-2 Vaccination 05/24/2019   Pfizer Covid-19 Vaccine Bivalent Booster 9yrs & up 07/10/2020    Past Medical History:  Diagnosis Date   Allergy    Chicken pox    Depression    Diabetes mellitus without complication (HCC)    Hypertension     Tobacco History: Social History   Tobacco Use  Smoking Status Never  Smokeless Tobacco Never   Counseling given: Not Answered   Outpatient Medications Prior to Visit  Medication Sig Dispense Refill   aspirin EC 81 MG tablet Take 81 mg by mouth daily.     atorvastatin (LIPITOR) 10 MG tablet Take 1 tablet (10 mg total) by mouth daily. 30 tablet 3    Biotin 10 MG TABS Take 10 mg by mouth daily.     buPROPion (WELLBUTRIN XL) 150 MG 24 hr tablet Take 1 tablet (150 mg total) by mouth daily. 90 tablet 0   calcium gluconate 500 MG tablet Take 1 tablet by mouth 3 (three) times daily.     Cholecalciferol (D3-1000 PO) Take by mouth.     clobetasol cream (TEMOVATE) 0.05 % Apply 1 application topically 2 (two) times daily. On affected area 30 g 0   hydrOXYzine (ATARAX/VISTARIL) 25 MG tablet Take 1 TABLET total) by mouth daily as needed for itching. 30 tablet 0   lisinopril (ZESTRIL) 10 MG tablet Take 1 tablet (10 mg total) by mouth daily. 90 tablet 3   meclizine (ANTIVERT) 25 MG tablet Take 1 tablet (25 mg total) by mouth 3 (three) times daily as needed for dizziness. 15 tablet 0   Melatonin 10 MG TABS Take 10 mg by mouth at bedtime.     metFORMIN (GLUCOPHAGE) 1000 MG tablet Take 1 tablet (1,000 mg total) by mouth 2 (two) times daily with a meal. 180 tablet 3   pantoprazole (PROTONIX) 40 MG tablet Take 1 tablet (40 mg total) by mouth 2 (two) times daily before a meal. 60 tablet 10   venlafaxine XR (EFFEXOR-XR) 150 MG 24 hr capsule Take 1 capsule (150 mg total) by mouth daily with breakfast. 90 capsule 0   scopolamine (TRANSDERM-SCOP) 1  MG/3DAYS Place 1 patch (1.5 mg total) onto the skin every 3 (three) days. For motion sickness. Start 2 hr before onset symptoms, up to 12 hr before (Patient not taking: Reported on 12/29/2020) 3 patch 0   No facility-administered medications prior to visit.     Review of Systems:   Constitutional:   No  weight loss, night sweats,  Fevers, chills,  +fatigue, or  lassitude.  HEENT:   No headaches,  Difficulty swallowing,  Tooth/dental problems, or  Sore throat,                No sneezing, itching, ear ache, nasal congestion, post nasal drip,   CV:  No chest pain,  Orthopnea, PND, swelling in lower extremities, anasarca, dizziness, palpitations, syncope.   GI  No heartburn, indigestion, abdominal pain, nausea,  vomiting, diarrhea, change in bowel habits, loss of appetite, bloody stools.   Resp: No shortness of breath with exertion or at rest.  No excess mucus, no productive cough,  No non-productive cough,  No coughing up of blood.  No change in color of mucus.  No wheezing.  No chest wall deformity  Skin: no rash or lesions.  GU: no dysuria, change in color of urine, no urgency or frequency.  No flank pain, no hematuria   MS:  No joint pain or swelling.  No decreased range of motion.  No back pain.    Physical Exam  BP 130/60 (BP Location: Left Arm, Patient Position: Sitting, Cuff Size: Normal)   Pulse (!) 105   Temp (!) 96.9 F (36.1 C) (Oral)   Ht 5' 2.5" (1.588 m)   Wt 199 lb 3.2 oz (90.4 kg)   LMP 02/01/2016 Comment: irregular  SpO2 99%   BMI 35.85 kg/m   GEN: A/Ox3; pleasant , NAD, well nourished    HEENT:  [place]/AT,   NOSE-clear, THROAT-clear, no lesions, no postnasal drip or exudate noted. Class 2-3 MP airway   NECK:  Supple w/ fair ROM; no JVD; normal carotid impulses w/o bruits; no thyromegaly or nodules palpated; no lymphadenopathy.    RESP  Clear  P & A; w/o, wheezes/ rales/ or rhonchi. no accessory muscle use, no dullness to percussion  CARD:  RRR, no m/r/g, no peripheral edema, pulses intact, no cyanosis or clubbing.  GI:   Soft & nt; nml bowel sounds; no organomegaly or masses detected.   Musco: Warm bil, no deformities or joint swelling noted.   Neuro: alert, no focal deficits noted.    Skin: Warm, no lesions or rashes    Lab Results:  BNP No results found for: BNP  ProBNP No results found for: PROBNP  Imaging: No results found.    No flowsheet data found.  No results found for: NITRICOXIDE      Assessment & Plan:   OSA (obstructive sleep apnea) Severe obstructive sleep apnea-begin CPAP at bedtime.  Patient education given on sleep apnea and CPAP care. Begin CPAP 10 cm H2O.  Plan  Patient Instructions  Begin CPAP at bedtime Goal was to  wear your CPAP all night long for at least 6 or more hours Work on healthy weight loss Do not drive if sleepy Follow-up in 3 months and as needed      Class 2 severe obesity due to excess calories with serious comorbidity and body mass index (BMI) of 35.0 to 35.9 in adult [facility]) Healthy weight loss discussed      Breanna Oaks, NP 12/29/2020

## 2021-01-04 ENCOUNTER — Ambulatory Visit (INDEPENDENT_AMBULATORY_CARE_PROVIDER_SITE_OTHER): Payer: BC Managed Care – PPO | Admitting: Internal Medicine

## 2021-01-04 ENCOUNTER — Other Ambulatory Visit: Payer: Self-pay

## 2021-01-04 ENCOUNTER — Other Ambulatory Visit: Payer: Self-pay | Admitting: Internal Medicine

## 2021-01-04 ENCOUNTER — Encounter: Payer: Self-pay | Admitting: Internal Medicine

## 2021-01-04 VITALS — BP 128/74 | HR 98 | Temp 97.1°F | Resp 17 | Ht 62.5 in | Wt 199.8 lb

## 2021-01-04 DIAGNOSIS — I10 Essential (primary) hypertension: Secondary | ICD-10-CM | POA: Diagnosis not present

## 2021-01-04 DIAGNOSIS — E782 Mixed hyperlipidemia: Secondary | ICD-10-CM | POA: Diagnosis not present

## 2021-01-04 DIAGNOSIS — Z1159 Encounter for screening for other viral diseases: Secondary | ICD-10-CM

## 2021-01-04 DIAGNOSIS — Z6835 Body mass index (BMI) 35.0-35.9, adult: Secondary | ICD-10-CM

## 2021-01-04 DIAGNOSIS — E1165 Type 2 diabetes mellitus with hyperglycemia: Secondary | ICD-10-CM

## 2021-01-04 DIAGNOSIS — Z23 Encounter for immunization: Secondary | ICD-10-CM | POA: Diagnosis not present

## 2021-01-04 DIAGNOSIS — M65332 Trigger finger, left middle finger: Secondary | ICD-10-CM

## 2021-01-04 DIAGNOSIS — Z0001 Encounter for general adult medical examination with abnormal findings: Secondary | ICD-10-CM

## 2021-01-04 DIAGNOSIS — Z1231 Encounter for screening mammogram for malignant neoplasm of breast: Secondary | ICD-10-CM

## 2021-01-04 NOTE — Assessment & Plan Note (Signed)
A1C today No urine microalbumin secondary to ACEI therapy Encouraged her to consume a low carb diet and exercise for weight loss Continue Metformin Encourage routine foot exams Will request copy of eye exam Flu shot today Pneumovax today Encouraged her to get a COVID booster

## 2021-01-04 NOTE — Assessment & Plan Note (Signed)
Controlled on Lisinopril  Reinforced DASH diet and exercise for weight loss CMET

## 2021-01-04 NOTE — Progress Notes (Signed)
Subjective:    Patient ID: Breanna Day, female    DOB: [birth year], 55 y.o.   MRN: [id number]  HPI  Patient presents the clinic today for her annual exam. She is also due to follow up HTN, HLD and DM 2:  HTN: Her BP today is 128/74. She is taking  Lisinopril as prescribed. There is no ECG on file.   HLD: Her last LDL was 128, triglycerides 329, 09/2020. She was started on Atorvastatin. She denies myalgias. She has been trying to consume a low fat diet.  DM 2: Her last A1C was 8%, 09/2020. She is taking Metformin as prescribed. She does not check her sugars. She is on Lisinopril for renal protection. She checks her feet routinely.  Flu: 12/2019 Tetanus: unsure COVID: Janssen x 1, Pfizer x 1 Pneumovax: never Shingrix: never Pap smear: 12/2017 Mammogram: 06/2019 Colon screening: never Vision screening: annually Dentist: biannually  Diet: She does eat meat. She consumes fruits and veggies. She tries to avoid fried foods. She drinks mostly water. Exercise: None   Review of Systems     Past Medical History:  Diagnosis Date   Allergy    Chicken pox    Depression    Diabetes mellitus without complication (HCC)    Hypertension     Current Outpatient Medications  Medication Sig Dispense Refill   aspirin EC 81 MG tablet Take 81 mg by mouth daily.     atorvastatin (LIPITOR) 10 MG tablet Take 1 tablet (10 mg total) by mouth daily. 30 tablet 3   Biotin 10 MG TABS Take 10 mg by mouth daily.     buPROPion (WELLBUTRIN XL) 150 MG 24 hr tablet Take 1 tablet (150 mg total) by mouth daily. 90 tablet 0   calcium gluconate 500 MG tablet Take 1 tablet by mouth 3 (three) times daily.     Cholecalciferol (D3-1000 PO) Take by mouth.     clobetasol cream (TEMOVATE) 0.05 % Apply 1 application topically 2 (two) times daily. On affected area 30 g 0   hydrOXYzine (ATARAX/VISTARIL) 25 MG tablet Take 1 TABLET total) by mouth daily as needed for itching. 30 tablet 0   lisinopril (ZESTRIL) 10 MG tablet  Take 1 tablet (10 mg total) by mouth daily. 90 tablet 3   meclizine (ANTIVERT) 25 MG tablet Take 1 tablet (25 mg total) by mouth 3 (three) times daily as needed for dizziness. 15 tablet 0   Melatonin 10 MG TABS Take 10 mg by mouth at bedtime.     metFORMIN (GLUCOPHAGE) 1000 MG tablet Take 1 tablet (1,000 mg total) by mouth 2 (two) times daily with a meal. 180 tablet 3   pantoprazole (PROTONIX) 40 MG tablet Take 1 tablet (40 mg total) by mouth 2 (two) times daily before a meal. 60 tablet 10   scopolamine (TRANSDERM-SCOP) 1 MG/3DAYS Place 1 patch (1.5 mg total) onto the skin every 3 (three) days. For motion sickness. Start 2 hr before onset symptoms, up to 12 hr before (Patient not taking: Reported on 12/29/2020) 3 patch 0   venlafaxine XR (EFFEXOR-XR) 150 MG 24 hr capsule Take 1 capsule (150 mg total) by mouth daily with breakfast. 90 capsule 0   No current facility-administered medications for this visit.    Allergies  Allergen Reactions   Sulfa Antibiotics Nausea Only   Elemental Sulfur Other (See Comments)    Muscle cramps     Family History  Problem Relation Age of Onset   Hypertension Mother  Arthritis Mother    Hyperlipidemia Mother    Hypertension Father    Arthritis Father    Hyperlipidemia Father    Diabetes Maternal Grandmother    Heart disease Maternal Grandmother    Arthritis Maternal Grandmother    Hypertension Maternal Grandmother    Cancer Maternal Grandfather    Heart disease Maternal Grandfather    Arthritis Maternal Grandfather    Hypertension Maternal Grandfather    Diabetes Paternal Grandmother    Cancer Paternal Grandmother    Arthritis Paternal Grandmother    Hypertension Paternal Grandmother    Diabetes Paternal Grandfather    Heart disease Paternal Grandfather    Arthritis Paternal Grandfather    Hypertension Paternal Grandfather    Cancer Maternal Uncle        Colon   Breast cancer Paternal Aunt 60    Social History   Socioeconomic History    Marital status: Married    Spouse name: Not on file   Number of children: 2   Years of education: Not on file   Highest education level: Some college, no degree  Occupational History   Not on file  Tobacco Use   Smoking status: Never   Smokeless tobacco: Never  Substance and Sexual Activity   Alcohol use: No    Alcohol/week: 0.0 standard drinks   Drug use: No   Sexual activity: Yes  Other Topics Concern   Not on file  Social History Narrative   Not on file   Social Determinants of Health   Financial Resource Strain: Not on file  Food Insecurity: Not on file  Transportation Needs: Not on file  Physical Activity: Not on file  Stress: Not on file  Social Connections: Not on file  Intimate Partner Violence: Not on file     Constitutional: Pt reports fatigue. Denies fever, malaise, headache or abrupt weight changes.  HEENT: Denies eye pain, eye redness, ear pain, ringing in the ears, wax buildup, runny nose, nasal congestion, bloody nose, or sore throat. Respiratory: Denies difficulty breathing, shortness of breath, cough or sputum production.   Cardiovascular: Denies chest pain, chest tightness, palpitations or swelling in the hands or feet.  Gastrointestinal: Denies abdominal pain, bloating, constipation, diarrhea or blood in the stool.  GU: Denies urgency, frequency, pain with urination, burning sensation, blood in urine, odor or discharge. Musculoskeletal: Pt reports trigger finger of left middle finger. Denies decrease in range of motion, difficulty with gait, muscle pain or joint pain and swelling.  Skin: Denies redness, rashes, lesions or ulcercations.  Neurological: Denies dizziness, difficulty with memory, difficulty with speech or problems with balance and coordination.  Psych: Pt has a history of anxiety and depression. Denies SI/HI.  No other specific complaints in a complete review of systems (except as listed in HPI above).  Objective:   Physical Exam  BP  128/74 (BP Location: Right Arm, Patient Position: Sitting, Cuff Size: Large)   Pulse 98   Temp (!) 97.1 F (36.2 C) (Temporal)   Resp 17   Ht 5' 2.5" (1.588 m)   Wt 199 lb 12.8 oz (90.6 kg)   LMP 02/01/2016 Comment: irregular  SpO2 99%   BMI 35.96 kg/m   Wt Readings from Last 3 Encounters:  12/29/20 199 lb 3.2 oz (90.4 kg)  11/03/20 198 lb 9.6 oz (90.1 kg)  09/28/20 194 lb 9.6 oz (88.3 kg)    General: Appears her stated age, obese, in NAD. Skin: Warm, dry and intact. No ulcerations noted. HEENT: Head: normal shape  and size; Eyes:  EOMs intact;  Neck:  Neck supple, trachea midline. No masses, lumps or thyromegaly present.  Cardiovascular: Normal rate and rhythm. S1,S2 noted.  No murmur, rubs or gallops noted. No JVD or BLE edema. No carotid bruits noted. Pulmonary/Chest: Normal effort and positive vesicular breath sounds. No respiratory distress. No wheezes, rales or ronchi noted.  Abdomen: Soft and nontender. Normal bowel sounds. No distention or masses noted. Liver, spleen and kidneys non palpable. Musculoskeletal: Strength 5/5 BUE/BLE. No difficulty with gait.  Neurological: Alert and oriented. Cranial nerves II-XII grossly intact. Coordination normal.  Psychiatric: Mood and affect normal. Behavior is normal. Judgment and thought content normal.     BMET    Component Value Date/Time   NA 137 09/28/2020 0917   K 4.8 09/28/2020 0917   CL 101 09/28/2020 0917   CO2 29 09/28/2020 0917   GLUCOSE 192 (H) 09/28/2020 0917   BUN 15 09/28/2020 0917   CREATININE 0.69 09/28/2020 0917   CALCIUM 9.7 09/28/2020 0917   GFRNONAA >60 05/15/2019 0410   GFRAA >60 05/15/2019 0410    Lipid Panel     Component Value Date/Time   CHOL 236 (H) 09/28/2020 0917   TRIG 329 (H) 09/28/2020 0917   HDL 61 09/28/2020 0917   CHOLHDL 3.9 09/28/2020 0917   VLDL 27.4 05/29/2019 1627   LDLCALC 128 (H) 09/28/2020 0917    CBC    Component Value Date/Time   WBC 10.4 09/28/2020 0917   RBC 4.84  09/28/2020 0917   HGB 13.9 09/28/2020 0917   HCT 41.6 09/28/2020 0917   PLT 463 (H) 09/28/2020 0917   MCV 86.0 09/28/2020 0917   MCH 28.7 09/28/2020 0917   MCHC 33.4 09/28/2020 0917   RDW 12.9 09/28/2020 0917   LYMPHSABS 3,815 01/17/2020 1456   EOSABS 912 (H) 01/17/2020 1456   BASOSABS 91 01/17/2020 1456    Hgb A1C Lab Results  Component Value Date   HGBA1C 8.0 (H) 09/28/2020            Assessment & Plan:   Preventative Health Maintenance:  Flu shot today She will get her tetanus vaccine in a few months at a nurse visit Pneumovax today Encouraged her to get her covid booster Discussed Shingrix vaccine- she will check coverage with her insurance provider Pap smear UTD Mammogram ordered, she will call to schedule She declines colon screening- Cologuard or colonoscopy ENcouraged her to consume a balanced diet and exercise regimen Advised her to see an eye doctor and dentist annually Will check CBC, CMET, Lipid, A1C and Hep C today  Trigger Finger, Left Hand:  Referral to orthopedics, Dr. Stephenie Acres for further evaluation and treatment RTC in 3 months, follow up DM 2  Nicki Reaper, NP This visit occurred during the SARS-CoV-2 public health emergency.  Safety protocols were in place, including screening questions prior to the visit, additional usage of staff PPE, and extensive cleaning of exam room while observing appropriate contact time as indicated for disinfecting solutions.

## 2021-01-04 NOTE — Assessment & Plan Note (Signed)
Encourage diet and exercise for weight loss 

## 2021-01-04 NOTE — Assessment & Plan Note (Signed)
C-Met and lipid profile today Encouraged her to consume a low-fat diet 

## 2021-01-04 NOTE — Patient Instructions (Signed)
Health Maintenance, Female Adopting a healthy lifestyle and getting preventive care are important in promoting health and wellness. Ask your health care provider about: The right schedule for you to have regular tests and exams. Things you can do on your own to prevent diseases and keep yourself healthy. What should I know about diet, weight, and exercise? Eat a healthy diet  Eat a diet that includes plenty of vegetables, fruits, low-fat dairy products, and lean protein. Do not eat a lot of foods that are high in solid fats, added sugars, or sodium. Maintain a healthy weight Body mass index (BMI) is used to identify weight problems. It estimates body fat based on height and weight. Your health care provider can help determine your BMI and help you achieve or maintain a healthy weight. Get regular exercise Get regular exercise. This is one of the most important things you can do for your health. Most adults should: Exercise for at least 150 minutes each week. The exercise should increase your heart rate and make you sweat (moderate-intensity exercise). Do strengthening exercises at least twice a week. This is in addition to the moderate-intensity exercise. Spend less time sitting. Even light physical activity can be beneficial. Watch cholesterol and blood lipids Have your blood tested for lipids and cholesterol at 55 years of age, then have this test every 5 years. Have your cholesterol levels checked more often if: Your lipid or cholesterol levels are high. You are older than 55 years of age. You are at high risk for heart disease. What should I know about cancer screening? Depending on your health history and family history, you may need to have cancer screening at various ages. This may include screening for: Breast cancer. Cervical cancer. Colorectal cancer. Skin cancer. Lung cancer. What should I know about heart disease, diabetes, and high blood pressure? Blood pressure and heart  disease High blood pressure causes heart disease and increases the risk of stroke. This is more likely to develop in people who have high blood pressure readings, are of African descent, or are overweight. Have your blood pressure checked: Every 3-5 years if you are 18-39 years of age. Every year if you are 40 years old or older. Diabetes Have regular diabetes screenings. This checks your fasting blood sugar level. Have the screening done: Once every three years after age 40 if you are at a normal weight and have a low risk for diabetes. More often and at a younger age if you are overweight or have a high risk for diabetes. What should I know about preventing infection? Hepatitis B If you have a higher risk for hepatitis B, you should be screened for this virus. Talk with your health care provider to find out if you are at risk for hepatitis B infection. Hepatitis C Testing is recommended for: Everyone born from 1945 through 1965. Anyone with known risk factors for hepatitis C. Sexually transmitted infections (STIs) Get screened for STIs, including gonorrhea and chlamydia, if: You are sexually active and are younger than 55 years of age. You are older than 55 years of age and your health care provider tells you that you are at risk for this type of infection. Your sexual activity has changed since you were last screened, and you are at increased risk for chlamydia or gonorrhea. Ask your health care provider if you are at risk. Ask your health care provider about whether you are at high risk for HIV. Your health care provider may recommend a prescription medicine   to help prevent HIV infection. If you choose to take medicine to prevent HIV, you should first get tested for HIV. You should then be tested every 3 months for as long as you are taking the medicine. Pregnancy If you are about to stop having your period (premenopausal) and you may become pregnant, seek counseling before you get  pregnant. Take 400 to 800 micrograms (mcg) of folic acid every day if you become pregnant. Ask for birth control (contraception) if you want to prevent pregnancy. Osteoporosis and menopause Osteoporosis is a disease in which the bones lose minerals and strength with aging. This can result in bone fractures. If you are 65 years old or older, or if you are at risk for osteoporosis and fractures, ask your health care provider if you should: Be screened for bone loss. Take a calcium or vitamin D supplement to lower your risk of fractures. Be given hormone replacement therapy (HRT) to treat symptoms of menopause. Follow these instructions at home: Lifestyle Do not use any products that contain nicotine or tobacco, such as cigarettes, e-cigarettes, and chewing tobacco. If you need help quitting, ask your health care provider. Do not use street drugs. Do not share needles. Ask your health care provider for help if you need support or information about quitting drugs. Alcohol use Do not drink alcohol if: Your health care provider tells you not to drink. You are pregnant, may be pregnant, or are planning to become pregnant. If you drink alcohol: Limit how much you use to 0-1 drink a day. Limit intake if you are breastfeeding. Be aware of how much alcohol is in your drink. In the [place] one drink equals one 12 oz bottle of beer (355 mL), one 5 oz glass of wine (148 mL), or one 1 oz glass of hard liquor (44 mL). General instructions Schedule regular health, dental, and eye exams. Stay current with your vaccines. Tell your health care provider if: You often feel depressed. You have ever been abused or do not feel safe at home. Summary Adopting a healthy lifestyle and getting preventive care are important in promoting health and wellness. Follow your health care provider's instructions about healthy diet, exercising, and getting tested or screened for diseases. Follow your health care provider's  instructions on monitoring your cholesterol and blood pressure. This information is not intended to replace advice given to you by your health care provider. Make sure you discuss any questions you have with your health care provider. Document Revised: 05/08/2020 Document Reviewed: 02/21/2018 Elsevier Patient Education  2022 [employer]vier Inc[employer]  

## 2021-01-05 LAB — COMPLETE METABOLIC PANEL WITH GFR
AG Ratio: 1.4 (calc) (ref 1.0–2.5)
ALT: 18 U/L (ref 6–29)
AST: 16 U/L (ref 10–35)
Albumin: 4.2 g/dL (ref 3.6–5.1)
Alkaline phosphatase (APISO): 92 U/L (ref 37–153)
BUN: 13 mg/dL (ref 7–25)
CO2: 25 mmol/L (ref 20–32)
Calcium: 9.4 mg/dL (ref 8.6–10.4)
Chloride: 100 mmol/L (ref 98–110)
Creat: 0.65 mg/dL (ref 0.50–1.03)
Globulin: 2.9 g/dL (calc) (ref 1.9–3.7)
Glucose, Bld: 237 mg/dL — ABNORMAL HIGH (ref 65–99)
Potassium: 4.8 mmol/L (ref 3.5–5.3)
Sodium: 136 mmol/L (ref 135–146)
Total Bilirubin: 0.3 mg/dL (ref 0.2–1.2)
Total Protein: 7.1 g/dL (ref 6.1–8.1)
eGFR: 104 mL/min/{1.73_m2} (ref >=60)

## 2021-01-05 LAB — CBC
HCT: 41.2 % (ref 35.0–45.0)
Hemoglobin: 13.5 g/dL (ref 11.7–15.5)
MCH: 28.3 pg (ref 27.0–33.0)
MCHC: 32.8 g/dL (ref 32.0–36.0)
MCV: 86.4 fL (ref 80.0–100.0)
MPV: 9.4 fL (ref 7.5–12.5)
Platelets: 449 10*3/uL — ABNORMAL HIGH (ref 140–400)
RBC: 4.77 10*6/uL (ref 3.80–5.10)
RDW: 13 % (ref 11.0–15.0)
WBC: 10.7 10*3/uL (ref 3.8–10.8)

## 2021-01-05 LAB — HEPATITIS C ANTIBODY
Hepatitis C Ab: NONREACTIVE
SIGNAL TO CUT-OFF: 0.01 (ref <1.00)

## 2021-01-05 LAB — LIPID PANEL
Cholesterol: 233 mg/dL — ABNORMAL HIGH (ref <200)
HDL: 65 mg/dL (ref >=50)
LDL Cholesterol (Calc): 139 mg/dL (calc) — ABNORMAL HIGH
Non-HDL Cholesterol (Calc): 168 mg/dL (calc) — ABNORMAL HIGH (ref <130)
Total CHOL/HDL Ratio: 3.6 (calc) (ref <5.0)
Triglycerides: 154 mg/dL — ABNORMAL HIGH (ref <150)

## 2021-01-05 LAB — HEMOGLOBIN A1C
Hgb A1c MFr Bld: 9 % of total Hgb — ABNORMAL HIGH (ref <5.7)
Mean Plasma Glucose: 212 mg/dL
eAG (mmol/L): 11.7 mmol/L

## 2021-01-05 LAB — TSH: TSH: 0.6 mIU/L

## 2021-01-06 ENCOUNTER — Encounter: Payer: Self-pay | Admitting: Internal Medicine

## 2021-01-06 MED ORDER — GLIPIZIDE 10 MG PO TABS: 10.0000 mg | ORAL_TABLET | Freq: Two times a day (BID) | ORAL | 2 refills | Status: DC

## 2021-01-06 MED ORDER — ATORVASTATIN CALCIUM 20 MG PO TABS: 20.0000 mg | ORAL_TABLET | Freq: Every day | ORAL | 0 refills | Status: DC

## 2021-01-06 NOTE — Addendum Note (Signed)
Addended by: Lorre Munroe on: 01/06/2021 03:30 PM   Modules accepted: Orders

## 2021-01-19 ENCOUNTER — Inpatient Hospital Stay: Admission: RE | Admit: 2021-01-19 | Payer: BC Managed Care – PPO | Source: Ambulatory Visit

## 2021-01-21 ENCOUNTER — Other Ambulatory Visit: Payer: Self-pay | Admitting: Internal Medicine

## 2021-01-21 NOTE — Telephone Encounter (Signed)
Requested Prescriptions  Pending Prescriptions Disp Refills  . buPROPion (WELLBUTRIN XL) 150 MG 24 hr tablet [Pharmacy Med Name: BUPROPION HCL XL 150 MG TABLET] 90 tablet 0    Sig: Take 1 tablet (150 mg total) by mouth daily.     Psychiatry: Antidepressants - bupropion Passed - 01/21/2021 11:43 AM      Passed - Completed PHQ-2 or PHQ-9 in the last 360 days      Passed - Last BP in normal range    BP Readings from Last 1 Encounters:  01/04/21 128/74         Passed - Valid encounter within last 6 months    Recent Outpatient Visits          2 weeks ago Encounter for general adult medical examination with abnormal findings   [facility] [place], Salvadore [place], NP   3 months ago Type 2 diabetes mellitus with hyperglycemia, without long-term current use of insulin [facility])   [facility] [place], Salvadore Oxford, NP   9 years ago Low back pain   [facility], [place] L, DO      Future Appointments            In 1 month Baity, Salvadore Oxford, NP [facility], [facility]

## 2021-03-09 ENCOUNTER — Ambulatory Visit: Payer: BC Managed Care – PPO | Admitting: Internal Medicine

## 2021-03-10 ENCOUNTER — Ambulatory Visit: Payer: BC Managed Care – PPO | Admitting: Internal Medicine

## 2021-03-16 ENCOUNTER — Other Ambulatory Visit: Payer: Self-pay | Admitting: Internal Medicine

## 2021-03-18 ENCOUNTER — Telehealth: Payer: Self-pay

## 2021-03-18 NOTE — Telephone Encounter (Signed)
Called and LVM in regards to CPAP machine would like to know if patient ever received her machine and if so could she please bring her SD card to her appointment on 1/9?

## 2021-03-22 ENCOUNTER — Ambulatory Visit: Payer: BC Managed Care – PPO | Admitting: Adult Health

## 2021-03-22 ENCOUNTER — Telehealth: Payer: Self-pay

## 2021-03-22 NOTE — Telephone Encounter (Signed)
Called and rescheduled patient for appointment on 1/9 rescheduled for 2/14. Patient has not yet been set up on CPAP, pt is being set up with adapt on 1/13.nothing further needed.

## 2021-04-05 ENCOUNTER — Other Ambulatory Visit: Payer: Self-pay | Admitting: Internal Medicine

## 2021-04-05 NOTE — Telephone Encounter (Signed)
Future OV 04/12/21. Approved per protocol.  Requested Prescriptions  Pending Prescriptions Disp Refills   atorvastatin (LIPITOR) 20 MG tablet [Pharmacy Med Name: ATORVASTATIN 20 MG TABLET] 90 tablet 0    Sig: Take 1 tablet (20 mg total) by mouth daily.     Cardiovascular:  Antilipid - Statins Failed - 04/05/2021 10:16 AM      Failed - Total Cholesterol in normal range and within 360 days    Cholesterol  Date Value Ref Range Status  01/04/2021 233 (H) <200 mg/dL Final         Failed - LDL in normal range and within 360 days    LDL Cholesterol (Calc)  Date Value Ref Range Status  01/04/2021 139 (H) mg/dL (calc) Final    Comment:    Reference range: <100 . Desirable range <100 mg/dL for primary prevention;   <70 mg/dL for patients with CHD or diabetic patients  with > or = 2 CHD risk factors. Marland Kitchen LDL-C is now calculated using the Martin-Hopkins  calculation, which is a validated novel method providing  better accuracy than the Friedewald equation in the  estimation of LDL-C.  Horald Pollen et al. Lenox Ahr. 9323;557(32): 2061-2068  (http://education.[facility].com/faq/FAQ164)    Direct LDL  Date Value Ref Range Status  01/15/2019 150.0 mg/dL Final    Comment:    Optimal:  <100 mg/dLNear or Above Optimal:  100-129 mg/dLBorderline High:  130-159 mg/dLHigh:  160-189 mg/dLVery High:  >190 mg/dL         Failed - Triglycerides in normal range and within 360 days    Triglycerides  Date Value Ref Range Status  01/04/2021 154 (H) <150 mg/dL Final         Passed - HDL in normal range and within 360 days    HDL  Date Value Ref Range Status  01/04/2021 65 > OR = 50 mg/dL Final         Passed - Patient is not pregnant      Passed - Valid encounter within last 12 months    Recent Outpatient Visits          3 months ago Encounter for general adult medical examination with abnormal findings   [facility] [place], Salvadore Oxford, NP   6 months ago Type 2 diabetes mellitus  with hyperglycemia, without long-term current use of insulin [facility])   [facility] [place], Salvadore Oxford, NP   9 years ago Low back pain   [facility], [place] L, DO      Future Appointments            In 1 week Sampson Si, Salvadore Oxford, NP [facility], [facility]

## 2021-04-12 ENCOUNTER — Encounter: Payer: Self-pay | Admitting: Internal Medicine

## 2021-04-12 ENCOUNTER — Other Ambulatory Visit: Payer: Self-pay | Admitting: Internal Medicine

## 2021-04-12 ENCOUNTER — Ambulatory Visit: Payer: BC Managed Care – PPO | Admitting: Internal Medicine

## 2021-04-12 ENCOUNTER — Other Ambulatory Visit: Payer: Self-pay

## 2021-04-12 VITALS — BP 144/87 | HR 97 | Temp 97.3°F | Wt 201.0 lb

## 2021-04-12 DIAGNOSIS — I10 Essential (primary) hypertension: Secondary | ICD-10-CM

## 2021-04-12 DIAGNOSIS — K219 Gastro-esophageal reflux disease without esophagitis: Secondary | ICD-10-CM

## 2021-04-12 DIAGNOSIS — E1165 Type 2 diabetes mellitus with hyperglycemia: Secondary | ICD-10-CM | POA: Diagnosis not present

## 2021-04-12 DIAGNOSIS — E782 Mixed hyperlipidemia: Secondary | ICD-10-CM

## 2021-04-12 DIAGNOSIS — F32A Depression, unspecified: Secondary | ICD-10-CM

## 2021-04-12 DIAGNOSIS — G4733 Obstructive sleep apnea (adult) (pediatric): Secondary | ICD-10-CM

## 2021-04-12 DIAGNOSIS — F419 Anxiety disorder, unspecified: Secondary | ICD-10-CM

## 2021-04-12 DIAGNOSIS — Z6836 Body mass index (BMI) 36.0-36.9, adult: Secondary | ICD-10-CM

## 2021-04-12 LAB — POCT GLYCOSYLATED HEMOGLOBIN (HGB A1C): Hemoglobin A1C: 11.9 % — AB (ref 4.0–5.6)

## 2021-04-12 MED ORDER — DEXCOM G6 RECEIVER DEVI: 1.0000 | Freq: Three times a day (TID) | 0 refills | Status: DC | PRN

## 2021-04-12 MED ORDER — HYDROXYZINE PAMOATE 50 MG PO CAPS: 50.0000 mg | ORAL_CAPSULE | Freq: Every day | ORAL | 0 refills | Status: DC

## 2021-04-12 MED ORDER — TIRZEPATIDE 2.5 MG/0.5ML ~~LOC~~ SOAJ: SUBCUTANEOUS | 0 refills | Status: DC

## 2021-04-12 MED ORDER — INSULIN PEN NEEDLE 31G X 5 MM MISC: 0 refills | Status: DC

## 2021-04-12 MED ORDER — DEXCOM G6 SENSOR MISC: 0 refills | Status: DC

## 2021-04-12 MED ORDER — LISINOPRIL 20 MG PO TABS: 20.0000 mg | ORAL_TABLET | Freq: Every day | ORAL | 0 refills | Status: DC

## 2021-04-12 NOTE — Assessment & Plan Note (Signed)
Advised her to start taking Atorvastatin, half tablet daily Encourage low-fat diet and exercise for weight loss Discussed risk of stroke given that she has high blood pressure, high cholesterol and uncontrolled diabetes

## 2021-04-12 NOTE — Assessment & Plan Note (Signed)
Stable on her current dose of Venlafaxine and Hydroxyzine Support offered

## 2021-04-12 NOTE — Progress Notes (Signed)
Subjective:    Patient ID: Breanna Day, female    DOB: 1965/03/18, 56 y.o.   MRN: [id number]  HPI  Patient presents to clinic today for 68-month follow-up of chronic conditions.  HTN: Her BP today is 144/87.  She is taking Lisinopril as prescribed.  There is no ECG on file.  HLD: Her last LDL was 139, triglycerides 154, 12/2020.  She is not taking Atorvastatin as prescribed.  She has been trying to consume a low-fat diet.  DM2: Her last A1c was 9%, 12/2020.  She is taking not Metformin as prescribed due ot GI upset but she is taking Glipizide as prescribed.  She does not check her sugars.  She is on Lisinopril for renal protection.  She checks her feet routinely.  Her last eye exam was 03/2021, [facility].  Flu 12/2020.  Pneumovax 12/2020.  COVID x 2.  GERD: She is not sure what triggers this. She denies breakthrough on Pantoprazole.  There is no upper GI on file.  OSA: She averages 7 hours of sleep per night without the use of her CPAP (problem with tubing).  Sleep study from 11/2020 reviewed.  Anxiety and Depression: Chronic, managed on Venlafaxine and Hydroxyzine.  She is not currently seeing a therapist.  She denies SI/HI.  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  She would like this increased to 50 mg at bedtime.  She has an appointment for allergy testing next week.  Review of Systems     Past Medical History:  Diagnosis Date   Allergy    Chicken pox    Depression    Diabetes mellitus without complication (HCC)    Hypertension     Current Outpatient Medications  Medication Sig Dispense Refill   aspirin EC 81 MG tablet Take 81 mg by mouth daily.     atorvastatin (LIPITOR) 20 MG tablet Take 1 tablet (20 mg total) by mouth daily. 90 tablet 0   Biotin 10 MG TABS Take 10 mg by mouth daily.     buPROPion (WELLBUTRIN XL) 150 MG 24 hr tablet Take 1 tablet (150 mg total) by mouth daily. 90 tablet 0   calcium gluconate 500 MG tablet Take  1 tablet by mouth 3 (three) times daily.     Cholecalciferol (D3-1000 PO) Take by mouth.     clobetasol cream (TEMOVATE) [id number] % Apply 1 application topically 2 (two) times daily. On affected area 30 g 0   glipiZIDE (GLUCOTROL) 10 MG tablet Take 1 tablet (10 mg total) by mouth 2 (two) times daily before a meal. 60 tablet 2   hydrOXYzine (ATARAX/VISTARIL) 25 MG tablet Take 1 TABLET total) by mouth daily as needed for itching. 30 tablet 0   lisinopril (ZESTRIL) 10 MG tablet Take 1 tablet (10 mg total) by mouth daily. 90 tablet 3   metFORMIN (GLUCOPHAGE) 1000 MG tablet Take 1 tablet (1,000 mg total) by mouth 2 (two) times daily with a meal. 180 tablet 3   pantoprazole (PROTONIX) 40 MG tablet Take 1 tablet (40 mg total) by mouth 2 (two) times daily before a meal. 60 tablet 10   venlafaxine XR (EFFEXOR-XR) 150 MG 24 hr capsule Take 1 capsule (150 mg total) by mouth daily with breakfast. 90 capsule 0   No current facility-administered medications for this visit.    Allergies  Allergen Reactions   Sulfa Antibiotics Nausea Only   Elemental Sulfur Other (See Comments)    Muscle cramps  Family History  Problem Relation Age of Onset   Hypertension Mother    Arthritis Mother    Hyperlipidemia Mother    Hypertension Father    Arthritis Father    Hyperlipidemia Father    Diabetes Maternal Grandmother    Heart disease Maternal Grandmother    Arthritis Maternal Grandmother    Hypertension Maternal Grandmother    Cancer Maternal Grandfather    Heart disease Maternal Grandfather    Arthritis Maternal Grandfather    Hypertension Maternal Grandfather    Diabetes Paternal Grandmother    Cancer Paternal Grandmother    Arthritis Paternal Grandmother    Hypertension Paternal Grandmother    Diabetes Paternal Grandfather    Heart disease Paternal Grandfather    Arthritis Paternal Grandfather    Hypertension Paternal Grandfather    Cancer Maternal Uncle        Colon   Breast cancer Paternal  Aunt 96    Social History   Socioeconomic History   Marital status: Married    Spouse name: Not on file   Number of children: 2   Years of education: Not on file   Highest education level: Some college, no degree  Occupational History   Not on file  Tobacco Use   Smoking status: Never   Smokeless tobacco: Never  Substance and Sexual Activity   Alcohol use: No    Alcohol/week: 0.0 standard drinks   Drug use: No   Sexual activity: Yes  Other Topics Concern   Not on file  Social History Narrative   Not on file   Social Determinants of Health   Financial Resource Strain: Not on file  Food Insecurity: Not on file  Transportation Needs: Not on file  Physical Activity: Not on file  Stress: Not on file  Social Connections: Not on file  Intimate Partner Violence: Not on file     Constitutional: Denies fever, malaise, fatigue, headache or abrupt weight changes.  HEENT: Denies eye pain, eye redness, ear pain, ringing in the ears, wax buildup, runny nose, nasal congestion, bloody nose, or sore throat. Respiratory: Denies difficulty breathing, shortness of breath, cough or sputum production.   Cardiovascular: Denies chest pain, chest tightness, palpitations or swelling in the hands or feet.  Gastrointestinal: Patient reports increased thirst.  Denies abdominal pain, bloating, constipation, diarrhea or blood in the stool.  GU: Patient reports urinary frequency.  Denies urgency, frequency, pain with urination, burning sensation, blood in urine, odor or discharge. Musculoskeletal: Denies decrease in range of motion, difficulty with gait, muscle pain or joint pain and swelling.  Skin: Denies redness, rashes, lesions or ulcercations.  Neurological: Denies dizziness, difficulty with memory, difficulty with speech or problems with balance and coordination.  Psych: Patient has a history of anxiety and depression.  Denies SI/HI.  No other specific complaints in a complete review of systems  (except as listed in HPI above).  Objective:   Physical Exam   BP (!) 144/87 (BP Location: Right Arm, Patient Position: Sitting, Cuff Size: Large)    Pulse 97    Temp (!) 97.3 F (36.3 C) (Temporal)    Wt 201 lb (91.2 kg)    LMP 02/01/2016 Comment: irregular   SpO2 97%    BMI 36.18 kg/m   Wt Readings from Last 3 Encounters:  01/04/21 199 lb 12.8 oz (90.6 kg)  12/29/20 199 lb 3.2 oz (90.4 kg)  11/03/20 198 lb 9.6 oz (90.1 kg)    General: Appears her stated age, obese, in NAD. Skin:  Warm, dry and intact. No rashes or ulcerations noted. HEENT: Head: normal shape and size; Eyes: sclera white and EOMs intact;  Neck:  Neck supple, trachea midline. No masses, lumps or thyromegaly present.  Cardiovascular: Normal rate and rhythm. S1,S2 noted.  No murmur, rubs or gallops noted. No JVD or BLE edema. No carotid bruits noted. Pulmonary/Chest: Normal effort and positive vesicular breath sounds. No respiratory distress. No wheezes, rales or ronchi noted.  Abdomen:  Normal bowel sounds. Musculoskeletal: No difficulty with gait.  Neurological: Alert and oriented. Psychiatric: Mood and affect normal. Behavior is normal. Judgment and thought content normal.     BMET    Component Value Date/Time   NA 136 01/04/2021 0908   K 4.8 01/04/2021 0908   CL 100 01/04/2021 0908   CO2 25 01/04/2021 0908   GLUCOSE 237 (H) 01/04/2021 0908   BUN 13 01/04/2021 0908   CREATININE 0.65 01/04/2021 0908   CALCIUM 9.4 01/04/2021 0908   GFRNONAA >60 05/15/2019 0410   GFRAA >60 05/15/2019 0410    Lipid Panel     Component Value Date/Time   CHOL 233 (H) 01/04/2021 0908   TRIG 154 (H) 01/04/2021 0908   HDL 65 01/04/2021 0908   CHOLHDL 3.6 01/04/2021 0908   VLDL 27.4 05/29/2019 1627   LDLCALC 139 (H) 01/04/2021 0908    CBC    Component Value Date/Time   WBC 10.7 01/04/2021 0908   RBC 4.77 01/04/2021 0908   HGB 13.5 01/04/2021 0908   HCT 41.2 01/04/2021 0908   PLT 449 (H) 01/04/2021 0908   MCV 86.4  01/04/2021 0908   MCH 28.3 01/04/2021 0908   MCHC 32.8 01/04/2021 0908   RDW 13.0 01/04/2021 0908   LYMPHSABS 3,815 01/17/2020 1456   EOSABS 912 (H) 01/17/2020 1456   BASOSABS 91 01/17/2020 1456    Hgb A1C Lab Results  Component Value Date   HGBA1C 9.0 (H) 01/04/2021          Assessment & Plan:    Webb Silversmith, NP This visit occurred during the SARS-CoV-2 public health emergency.  Safety protocols were in place, including screening questions prior to the visit, additional usage of staff PPE, and extensive cleaning of exam room while observing appropriate contact time as indicated for disinfecting solutions.

## 2021-04-12 NOTE — Addendum Note (Signed)
Addended by: Paschal Dopp on: 04/12/2021 09:37 AM   Modules accepted: Orders

## 2021-04-12 NOTE — Assessment & Plan Note (Signed)
Encourage diet and exercise for weight loss 

## 2021-04-12 NOTE — Assessment & Plan Note (Signed)
She is not currently wearing her CPAP due to problems with the tubing She will reach out to pulmonology regarding this Encourage weight loss as this can help reduce sleep apnea symptoms

## 2021-04-12 NOTE — Patient Instructions (Signed)

## 2021-04-12 NOTE — Assessment & Plan Note (Signed)
POCT A1c 11.9% Urine microalbumin checked 09/2020 Will D/C Metformin as it is causing her GI side effects and she is not taking consistently Continue Glipizide 10 mg 2 times daily We will add [facility] for Dexcom sensor and reader Encourage low-carb diet and exercise for weight loss Will request copy of eye exam Encourage routine foot exam Flu and Pneumovax UTD Encouraged her to get her COVID booster

## 2021-04-12 NOTE — Telephone Encounter (Signed)
Requested Prescriptions  Pending Prescriptions Disp Refills   glipiZIDE (GLUCOTROL) 10 MG tablet [Pharmacy Med Name: GLIPIZIDE 10 MG TABLET] 60 tablet 2    Sig: Take 1 tablet (10 mg total) by mouth 2 (two) times daily before a meal.     Endocrinology:  Diabetes - Sulfonylureas Failed - 04/12/2021 12:46 PM      Failed - HBA1C is between 0 and 7.9 and within 180 days    Hemoglobin A1C  Date Value Ref Range Status  04/12/2021 11.9 (A) 4.0 - 5.6 % Final   Hgb A1c MFr Bld  Date Value Ref Range Status  01/04/2021 9.0 (H) <5.7 % of total Hgb Final    Comment:    For someone without known diabetes, a hemoglobin A1c value of 6.5% or greater indicates that they may have  diabetes and this should be confirmed with a follow-up  test. . For someone with known diabetes, a value <7% indicates  that their diabetes is well controlled and a value  greater than or equal to 7% indicates suboptimal  control. A1c targets should be individualized based on  duration of diabetes, age, comorbid conditions, and  other considerations. . Currently, no consensus exists regarding use of hemoglobin A1c for diagnosis of diabetes for children. Verna Czech - Valid encounter within last 6 months    Recent Outpatient Visits          Today Type 2 diabetes mellitus with hyperglycemia, without long-term current use of insulin [facility])   [facility] [place], Salvadore Oxford, NP   3 months ago Encounter for general adult medical examination with abnormal findings   [facility] [place], Salvadore Oxford, NP   6 months ago Type 2 diabetes mellitus with hyperglycemia, without long-term current use of insulin [facility])   [facility], NP   9 years ago Low back pain   [facility], Mitchel Honour, DO      Future Appointments            In 3 months Baity, Salvadore Oxford, NP [facility], [facility]

## 2021-04-12 NOTE — Assessment & Plan Note (Signed)
Uncontrolled Will increase Lisinopril to 20 mg daily Reinforced DASH diet and exercise for weight loss Will monitor

## 2021-04-12 NOTE — Assessment & Plan Note (Signed)
Try to identify foods that trigger reflux and avoid them Encourage weight loss as this can produce reflux symptoms Continue Pantoprazole

## 2021-04-14 ENCOUNTER — Encounter: Payer: Self-pay | Admitting: Internal Medicine

## 2021-04-15 MED ORDER — OZEMPIC (0.25 OR 0.5 MG/DOSE) 2 MG/1.5ML ~~LOC~~ SOPN: 0.2500 mg | PEN_INJECTOR | SUBCUTANEOUS | 0 refills | Status: DC

## 2021-04-26 ENCOUNTER — Telehealth: Payer: Self-pay

## 2021-04-26 NOTE — Telephone Encounter (Signed)
Called and LVM in regards to upcoming appt. Would like for her to bring SD card to CPAP if possible.

## 2021-04-27 ENCOUNTER — Telehealth: Payer: Self-pay

## 2021-04-27 ENCOUNTER — Ambulatory Visit: Payer: BC Managed Care – PPO | Admitting: Adult Health

## 2021-04-27 NOTE — Telephone Encounter (Signed)
LVM for patient x2 in regards to bringing SD card.

## 2021-04-27 NOTE — Telephone Encounter (Signed)
Called and spoke to patient's husband (DPR) to get in contact with wife about upcoming appt. Husband states he is unable to get in contact with her either and states " you will just have to get trying."   Nothing further needed.   Patient needs to have appt rescheduled.

## 2021-04-30 ENCOUNTER — Other Ambulatory Visit: Payer: Self-pay | Admitting: Internal Medicine

## 2021-05-01 NOTE — Telephone Encounter (Signed)
Requested Prescriptions  Pending Prescriptions Disp Refills   buPROPion (WELLBUTRIN XL) 150 MG 24 hr tablet [Pharmacy Med Name: BUPROPION HCL XL 150 MG TABLET] 90 tablet 0    Sig: Take 1 tablet (150 mg total) by mouth daily.     Psychiatry: Antidepressants - bupropion Failed - 04/30/2021  1:24 PM      Failed - Last BP in normal range    BP Readings from Last 1 Encounters:  04/12/21 (!) 144/87         Passed - Cr in normal range and within 360 days    Creat  Date Value Ref Range Status  01/04/2021 0.65 0.50 - 1.03 mg/dL Final   Creatinine, Urine  Date Value Ref Range Status  09/28/2020 293 (H) 20 - 275 mg/dL Final         Passed - AST in normal range and within 360 days    AST  Date Value Ref Range Status  01/04/2021 16 10 - 35 U/L Final         Passed - ALT in normal range and within 360 days    ALT  Date Value Ref Range Status  01/04/2021 18 6 - 29 U/L Final         Passed - Completed PHQ-2 or PHQ-9 in the last 360 days      Passed - Valid encounter within last 6 months    Recent Outpatient Visits          2 weeks ago Type 2 diabetes mellitus with hyperglycemia, without long-term current use of insulin (HCC)   [facility] [place], Salvadore Oxford, NP   3 months ago Encounter for general adult medical examination with abnormal findings   [facility] [place], Salvadore Oxford, NP   7 months ago Type 2 diabetes mellitus with hyperglycemia, without long-term current use of insulin [facility])   [facility] [place], Salvadore Oxford, NP   9 years ago Low back pain   [facility], Mitchel Honour, DO      Future Appointments            In 2 months Baity, Salvadore Oxford, NP [facility], [facility]

## 2021-05-24 ENCOUNTER — Other Ambulatory Visit: Payer: Self-pay | Admitting: Internal Medicine

## 2021-05-25 NOTE — Telephone Encounter (Signed)
Requested Prescriptions  ?Pending Prescriptions Disp Refills  ?? OZEMPIC, 0.25 OR 0.5 MG/DOSE, 2 MG/1.5ML SOPN [Pharmacy Med Name: OZEMPIC 0.25-0.5 MG DOSE PEN] 1.5 mL 0  ?  Sig: Inject 0.25 mg into the skin once a week. For first 4 weeks. Then increase dose to 0.5mg  weekly  ?  ? Endocrinology:  Diabetes - GLP-1 Receptor Agonists - semaglutide Failed - 05/24/2021 12:40 PM  ?  ?  Failed - HBA1C in normal range and within 180 days  ?  Hemoglobin A1C  ?Date Value Ref Range Status  ?04/12/2021 11.9 (A) 4.0 - 5.6 % Final  ? ?Hgb A1c MFr Bld  ?Date Value Ref Range Status  ?01/04/2021 9.0 (H) <5.7 % of total Hgb Final  ?  Comment:  ?  For someone without known diabetes, a hemoglobin A1c ?value of 6.5% or greater indicates that they may have  ?diabetes and this should be confirmed with a follow-up  ?test. ?. ?For someone with known diabetes, a value <7% indicates  ?that their diabetes is well controlled and a value  ?greater than or equal to 7% indicates suboptimal  ?control. A1c targets should be individualized based on  ?duration of diabetes, age, comorbid conditions, and  ?other considerations. ?. ?Currently, no consensus exists regarding use of ?hemoglobin A1c for diagnosis of diabetes for children. ?. ?  ?   ?  ?  Passed - Cr in normal range and within 360 days  ?  Creat  ?Date Value Ref Range Status  ?01/04/2021 0.65 0.50 - 1.03 mg/dL Final  ? ?Creatinine, Urine  ?Date Value Ref Range Status  ?09/28/2020 293 (H) 20 - 275 mg/dL Final  ?   ?  ?  Passed - Valid encounter within last 6 months  ?  Recent Outpatient Visits   ?      ? 1 month ago Type 2 diabetes mellitus with hyperglycemia, without long-term current use of insulin (HCC)  ? [facility] [place], Salvadore Oxford, NP  ? 4 months ago Encounter for general adult medical examination with abnormal findings  ? [facility] [place], [place] W, NP  ? 7 months ago Type 2 diabetes mellitus with hyperglycemia, without long-term current use of insulin  (HCC)  ? [facility] [place], [place] W, NP  ? 9 years ago Low back pain  ? Primary Care at [facility], [place] L, DO  ?  ?  ?Future Appointments   ?        ? In 1 month Baity, Salvadore Oxford, NP [facility], [facility]  ?  ? ?  ?  ?  ? ?

## 2021-06-04 ENCOUNTER — Encounter: Payer: Self-pay | Admitting: Internal Medicine

## 2021-06-09 MED ORDER — DEXCOM G6 TRANSMITTER MISC: 1.0000 | 0 refills | Status: DC | PRN

## 2021-06-09 MED ORDER — DEXCOM G7 RECEIVER DEVI: 1.0000 | 0 refills | Status: DC | PRN

## 2021-06-09 MED ORDER — DEXCOM G7 SENSOR MISC: 0 refills | Status: DC

## 2021-06-23 ENCOUNTER — Other Ambulatory Visit: Payer: Self-pay | Admitting: Internal Medicine

## 2021-06-24 NOTE — Telephone Encounter (Signed)
Requested Prescriptions  ?Pending Prescriptions Disp Refills  ?? OZEMPIC, 0.25 OR 0.5 MG/DOSE, 2 MG/1.5ML SOPN [Pharmacy Med Name: OZEMPIC 0.25-0.5 MG DOSE PEN] 1.5 mL 0  ?  Sig: Inject 0.25 mg into the skin once a week. For first 4 weeks. Then increase dose to 0.5mg  weekly  ?  ? Endocrinology:  Diabetes - GLP-1 Receptor Agonists - semaglutide Failed - 06/23/2021  2:04 PM  ?  ?  Failed - HBA1C in normal range and within 180 days  ?  Hemoglobin A1C  ?Date Value Ref Range Status  ?04/12/2021 11.9 (A) 4.0 - 5.6 % Final  ? ?Hgb A1c MFr Bld  ?Date Value Ref Range Status  ?01/04/2021 9.0 (H) <5.7 % of total Hgb Final  ?  Comment:  ?  For someone without known diabetes, a hemoglobin A1c ?value of 6.5% or greater indicates that they may have  ?diabetes and this should be confirmed with a follow-up  ?test. ?. ?For someone with known diabetes, a value <7% indicates  ?that their diabetes is well controlled and a value  ?greater than or equal to 7% indicates suboptimal  ?control. A1c targets should be individualized based on  ?duration of diabetes, age, comorbid conditions, and  ?other considerations. ?. ?Currently, no consensus exists regarding use of ?hemoglobin A1c for diagnosis of diabetes for children. ?. ?  ?   ?  ?  Passed - Cr in normal range and within 360 days  ?  Creat  ?Date Value Ref Range Status  ?01/04/2021 0.65 0.50 - 1.03 mg/dL Final  ? ?Creatinine, Urine  ?Date Value Ref Range Status  ?09/28/2020 293 (H) 20 - 275 mg/dL Final  ?   ?  ?  Passed - Valid encounter within last 6 months  ?  Recent Outpatient Visits   ?      ? 2 months ago Type 2 diabetes mellitus with hyperglycemia, without long-term current use of insulin (HCC)  ? [facility] [place], Salvadore Oxford, NP  ? 5 months ago Encounter for general adult medical examination with abnormal findings  ? [facility] [place], [place] W, NP  ? 8 months ago Type 2 diabetes mellitus with hyperglycemia, without long-term current use of insulin  (HCC)  ? [facility] [place], [place] W, NP  ? 9 years ago Low back pain  ? [facility] at [facility], [place] L, DO  ?  ?  ?Future Appointments   ?        ? In 2 weeks Baity, Salvadore Oxford, NP [facility], [facility]  ?  ? ?  ?  ?  ? ?

## 2021-07-06 ENCOUNTER — Other Ambulatory Visit: Payer: Self-pay | Admitting: Internal Medicine

## 2021-07-08 ENCOUNTER — Other Ambulatory Visit: Payer: Self-pay | Admitting: Internal Medicine

## 2021-07-08 NOTE — Telephone Encounter (Signed)
Requested Prescriptions  ?Pending Prescriptions Disp Refills  ?? atorvastatin (LIPITOR) 20 MG tablet [Pharmacy Med Name: ATORVASTATIN 20 MG TABLET] 90 tablet 0  ?  Sig: Take 1 tablet (20 mg total) by mouth daily.  ?  ? Cardiovascular:  Antilipid - Statins Failed - 07/06/2021  1:04 PM  ?  ?  Failed - Lipid Panel in normal range within the last 12 months  ?  Cholesterol  ?Date Value Ref Range Status  ?01/04/2021 233 (H) <200 mg/dL Final  ? ?LDL Cholesterol (Calc)  ?Date Value Ref Range Status  ?01/04/2021 139 (H) mg/dL (calc) Final  ?  Comment:  ?  Reference range: <100 ?Marland Kitchen ?Desirable range <100 mg/dL for primary prevention;   ?<70 mg/dL for patients with CHD or diabetic patients  ?with > or = 2 CHD risk factors. ?. ?LDL-C is now calculated using the Martin-Hopkins  ?calculation, which is a validated novel method providing  ?better accuracy than the Friedewald equation in the  ?estimation of LDL-C.  ?Horald Pollen et al. Lenox Ahr. 3300;762(26): 2061-2068  ?(http://education.[employer].com/faq/FAQ164) ?  ? ?Direct LDL  ?Date Value Ref Range Status  ?01/15/2019 150.0 mg/dL Final  ?  Comment:  ?  Optimal:  <100 mg/dLNear or Above Optimal:  100-129 mg/dLBorderline High:  130-159 mg/dLHigh:  160-189 mg/dLVery High:  >190 mg/dL  ? ?HDL  ?Date Value Ref Range Status  ?01/04/2021 65 > OR = 50 mg/dL Final  ? ?Triglycerides  ?Date Value Ref Range Status  ?01/04/2021 154 (H) <150 mg/dL Final  ? ?  ?  ?  Passed - Patient is not pregnant  ?  ?  Passed - Valid encounter within last 12 months  ?  Recent Outpatient Visits   ?      ? 2 months ago Type 2 diabetes mellitus with hyperglycemia, without long-term current use of insulin (HCC)  ? [facility] [place], Salvadore Oxford, NP  ? 6 months ago Encounter for general adult medical examination with abnormal findings  ? [facility] [place], [place], NP  ? 9 months ago Type 2 diabetes mellitus with hyperglycemia, without long-term current use of insulin (HCC)  ? [facility] [place], [place] W, NP  ? 9 years ago Low back pain  ? Primary Care at [facility], [place] L, DO  ?  ?  ?Future Appointments   ?        ? In 6 days Baity, Salvadore Oxford, NP [facility], [facility]  ?  ? ?  ?  ?  ? ? ?

## 2021-07-09 NOTE — Telephone Encounter (Signed)
Requested Prescriptions  ?Pending Prescriptions Disp Refills  ?? hydrOXYzine (VISTARIL) 50 MG capsule [Pharmacy Med Name: HYDROXYZINE PAM 50 MG CAP] 90 capsule 0  ?  Sig: Take 1 capsule (50 mg total) by mouth at bedtime.  ?  ? Ear, Nose, and Throat:  Antihistamines 2 Passed - 07/08/2021  2:48 PM  ?  ?  Passed - Cr in normal range and within 360 days  ?  Creat  ?Date Value Ref Range Status  ?01/04/2021 0.65 0.50 - 1.03 mg/dL Final  ? ?Creatinine, Urine  ?Date Value Ref Range Status  ?09/28/2020 293 (H) 20 - 275 mg/dL Final  ?   ?  ?  Passed - Valid encounter within last 12 months  ?  Recent Outpatient Visits   ?      ? 2 months ago Type 2 diabetes mellitus with hyperglycemia, without long-term current use of insulin (HCC)  ? [facility] [place], Salvadore Oxford, NP  ? 6 months ago Encounter for general adult medical examination with abnormal findings  ? [facility], [place] W, NP  ? 9 months ago Type 2 diabetes mellitus with hyperglycemia, without long-term current use of insulin (HCC)  ? [facility], [place] W, NP  ? 9 years ago Low back pain  ? [facility], [place] L, DO  ?  ?  ?Future Appointments   ?        ? In 5 days Baity, Salvadore Oxford, NP [facility]  ?  ? ?  ?  ?  ?? glipiZIDE (GLUCOTROL) 10 MG tablet [Pharmacy Med Name: GLIPIZIDE 10 MG TABLET] 60 tablet 0  ?  Sig: Take 1 tablet (10 mg total) by mouth 2 (two) times daily before a meal.  ?  ? Endocrinology:  Diabetes - Sulfonylureas Failed - 07/08/2021  2:48 PM  ?  ?  Failed - HBA1C is between 0 and 7.9 and within 180 days  ?  Hemoglobin A1C  ?Date Value Ref Range Status  ?04/12/2021 11.9 (A) 4.0 - 5.6 % Final  ? ?Hgb A1c MFr Bld  ?Date Value Ref Range Status  ?01/04/2021 9.0 (H) <5.7 % of total Hgb Final  ?  Comment:  ?  For someone without known diabetes, a hemoglobin A1c ?value of 6.5% or greater indicates that they may have  ?diabetes and this should be confirmed with a  follow-up  ?test. ?. ?For someone with known diabetes, a value <7% indicates  ?that their diabetes is well controlled and a value  ?greater than or equal to 7% indicates suboptimal  ?control. A1c targets should be individualized based on  ?duration of diabetes, age, comorbid conditions, and  ?other considerations. ?. ?Currently, no consensus exists regarding use of ?hemoglobin A1c for diagnosis of diabetes for children. ?. ?  ?   ?  ?  Passed - Cr in normal range and within 360 days  ?  Creat  ?Date Value Ref Range Status  ?01/04/2021 0.65 0.50 - 1.03 mg/dL Final  ? ?Creatinine, Urine  ?Date Value Ref Range Status  ?09/28/2020 293 (H) 20 - 275 mg/dL Final  ?   ?  ?  Passed - Valid encounter within last 6 months  ?  Recent Outpatient Visits   ?      ? 2 months ago Type 2 diabetes mellitus with hyperglycemia, without long-term current use of insulin (HCC)  ? [facility] [place], [place],  NP  ? 6 months ago Encounter for general adult medical examination with abnormal findings  ? [facility] [place], [place] W, NP  ? 9 months ago Type 2 diabetes mellitus with hyperglycemia, without long-term current use of insulin (HCC)  ? [facility] [place], [place] W, NP  ? 9 years ago Low back pain  ? [facility] at [facility], [place] L, DO  ?  ?  ?Future Appointments   ?        ? In 5 days Baity, Salvadore Oxford, NP [facility]  ?  ? ?  ?  ?  ? ? ?

## 2021-07-14 ENCOUNTER — Ambulatory Visit (INDEPENDENT_AMBULATORY_CARE_PROVIDER_SITE_OTHER): Payer: BC Managed Care – PPO | Admitting: Internal Medicine

## 2021-07-14 ENCOUNTER — Encounter: Payer: Self-pay | Admitting: Internal Medicine

## 2021-07-14 VITALS — BP 124/78 | HR 103 | Temp 97.3°F | Wt 189.0 lb

## 2021-07-14 DIAGNOSIS — E6609 Other obesity due to excess calories: Secondary | ICD-10-CM | POA: Diagnosis not present

## 2021-07-14 DIAGNOSIS — K219 Gastro-esophageal reflux disease without esophagitis: Secondary | ICD-10-CM | POA: Diagnosis not present

## 2021-07-14 DIAGNOSIS — E1165 Type 2 diabetes mellitus with hyperglycemia: Secondary | ICD-10-CM | POA: Diagnosis not present

## 2021-07-14 DIAGNOSIS — F419 Anxiety disorder, unspecified: Secondary | ICD-10-CM | POA: Diagnosis not present

## 2021-07-14 DIAGNOSIS — I10 Essential (primary) hypertension: Secondary | ICD-10-CM

## 2021-07-14 DIAGNOSIS — Z6834 Body mass index (BMI) 34.0-34.9, adult: Secondary | ICD-10-CM

## 2021-07-14 DIAGNOSIS — G4733 Obstructive sleep apnea (adult) (pediatric): Secondary | ICD-10-CM

## 2021-07-14 DIAGNOSIS — F32A Depression, unspecified: Secondary | ICD-10-CM

## 2021-07-14 DIAGNOSIS — E782 Mixed hyperlipidemia: Secondary | ICD-10-CM

## 2021-07-14 DIAGNOSIS — D75839 Thrombocytosis, unspecified: Secondary | ICD-10-CM

## 2021-07-14 MED ORDER — PANTOPRAZOLE SODIUM 40 MG PO TBEC: 40.0000 mg | DELAYED_RELEASE_TABLET | Freq: Every day | ORAL | 0 refills | Status: DC

## 2021-07-14 MED ORDER — OZEMPIC (1 MG/DOSE) 4 MG/3ML ~~LOC~~ SOPN: 1.0000 mg | PEN_INJECTOR | SUBCUTANEOUS | 0 refills | Status: DC

## 2021-07-14 MED ORDER — GLIPIZIDE 10 MG PO TABS: 10.0000 mg | ORAL_TABLET | Freq: Two times a day (BID) | ORAL | 0 refills | Status: DC

## 2021-07-14 MED ORDER — LISINOPRIL 20 MG PO TABS
20.0000 mg | ORAL_TABLET | Freq: Every day | ORAL | 0 refills | Status: DC
Start: 2021-07-14 — End: 2022-09-23

## 2021-07-14 NOTE — Assessment & Plan Note (Signed)
Encouraged continue weight loss as this can help reduce sleep apnea symptoms ?

## 2021-07-14 NOTE — Assessment & Plan Note (Signed)
Stable with Hydroxyzine as needed ?Support offered ?

## 2021-07-14 NOTE — Assessment & Plan Note (Signed)
A1c and urine microalbumin today ?Encouraged her to consume a low-carb diet and exercise weight loss ?Continue Glipizide and Ozempic, will increase Ozempic to 1 mg weekly ?We will request copy of eye exam ?Encourage routine foot exam ?Encouraged her to get a flu shot in the fall ?Pneumovax UTD ?Encouraged her to get a COVID booster ?

## 2021-07-14 NOTE — Assessment & Plan Note (Signed)
Avoid foods that trigger reflux ?Encouraged continued weight loss as this can help reduce reflux symptoms ?Continue Pantoprazole, refilled today ?

## 2021-07-14 NOTE — Assessment & Plan Note (Addendum)
Controlled on Lisinopril, refilled today ?Reinforced DASH diet and exercise for weight loss ?C-Met today ?

## 2021-07-14 NOTE — Assessment & Plan Note (Signed)
We will increase Ozempic to 1 mg weekly ?Encourage low-carb diet and exercise for weight loss ?

## 2021-07-14 NOTE — Assessment & Plan Note (Signed)
C-Met and lipid profile today ?Encouraged her to consume a low-fat diet ?Continue Atorvastatin, will adjust dose if needed based on labs ?

## 2021-07-14 NOTE — Patient Instructions (Signed)

## 2021-07-14 NOTE — Progress Notes (Signed)
? ?Subjective:  ? ? Patient ID: Breanna Day, female    DOB: 1965-04-28, 56 y.o.   MRN: [id number] ? ?HPI ? ?Patient presents to clinic today for follow-up of chronic conditions. ? ?HTN: Her BP today is 124/78.  She is taking Lisinopril as prescribed.  There is no ECG on file. ? ?HLD: Her last LDL was 139, triglycerides 154.  She denies myalgias on Atorvastatin.  She tries to consume a low-fat diet. ? ?DM2: Her last A1c was 11.9%, 03/2021.  She is taking Glipizide and Ozempic as prescribed.  She had the Dexcom but her insurance will no longer pay for it. She is on Lisinopril for renal protection.  She checks her feet routinely.  Her last eye exam was 03/2021.  Flu 12/2020.  Pneumovax 12/2020.  COVID x2. ? ?GERD: She is not sure what triggers this.  She denies breakthrough on Pantoprazole.  There is no upper GI on file. ? ?OSA: She averages 7 hours of sleep per night without the use of her CPAP.  Sleep study from 11/2020 reviewed. ? ?Anxiety and Depression: Chronic, managed on Hydroxyzine as needed. She is not currently seeing a therapist.  She denies SI/HI. ? 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  She follows with allergy. ? ?Thrombocytosis: Her last platelet count was 449, 12/2020.  She is not taking Aspirin.  She does not follow with hematology. ? ?Review of Systems ? ?   ?Past Medical History:  ?Diagnosis Date  ? Allergy   ? Chicken pox   ? Depression   ? Diabetes mellitus without complication (HCC)   ? Hypertension   ? ? ?Current Outpatient Medications  ?Medication Sig Dispense Refill  ? aspirin EC 81 MG tablet Take 81 mg by mouth daily.    ? atorvastatin (LIPITOR) 20 MG tablet Take 1 tablet (20 mg total) by mouth daily. 90 tablet 0  ? Biotin 10 MG TABS Take 10 mg by mouth daily.    ? buPROPion (WELLBUTRIN XL) 150 MG 24 hr tablet Take 1 tablet (150 mg total) by mouth daily. 90 tablet 0  ? calcium gluconate 500 MG tablet Take 1 tablet by mouth 3 (three) times daily.    ? Cholecalciferol  (D3-1000 PO) Take by mouth.    ? clobetasol cream (TEMOVATE) 0.05 % Apply 1 application topically 2 (two) times daily. On affected area 30 g 0  ? Continuous Blood Gluc Receiver (DEXCOM G7 RECEIVER) DEVI 1 Device by Does not apply route as needed. 1 each 0  ? Continuous Blood Gluc Sensor (DEXCOM G7 SENSOR) MISC Apply 1 device every 10 days 9 each 0  ? Continuous Blood Gluc Transmit (DEXCOM G6 TRANSMITTER) MISC 1 Device by Does not apply route as needed. 1 each 0  ? glipiZIDE (GLUCOTROL) 10 MG tablet Take 1 tablet (10 mg total) by mouth 2 (two) times daily before a meal. 60 tablet 0  ? hydrOXYzine (VISTARIL) 50 MG capsule Take 1 capsule (50 mg total) by mouth at bedtime. 90 capsule 0  ? Insulin Pen Needle 31G X 5 MM MISC BD Pen Needles- brand specific Inject insulin via insulin pen 6 x daily 30 each 0  ? lisinopril (ZESTRIL) 20 MG tablet Take 1 tablet (20 mg total) by mouth daily. 90 tablet 0  ? OZEMPIC, 0.25 OR 0.5 MG/DOSE, 2 MG/1.5ML SOPN Inject 0.25 mg into the skin once a week. For first 4 weeks. Then increase dose to 0.5mg  weekly 1.5 mL 0  ? pantoprazole (  PROTONIX) 40 MG tablet Take 1 tablet (40 mg total) by mouth 2 (two) times daily before a meal. 60 tablet 10  ? venlafaxine XR (EFFEXOR-XR) 150 MG 24 hr capsule Take 1 capsule (150 mg total) by mouth daily with breakfast. 90 capsule 0  ? ?No current facility-administered medications for this visit.  ? ? ?Allergies  ?Allergen Reactions  ? Sulfa Antibiotics Nausea Only  ? Elemental Sulfur Other (See Comments)  ?  Muscle cramps ?  ? ? ?Family History  ?Problem Relation Age of Onset  ? Hypertension Mother   ? Arthritis Mother   ? Hyperlipidemia Mother   ? Hypertension Father   ? Arthritis Father   ? Hyperlipidemia Father   ? Diabetes Maternal Grandmother   ? Heart disease Maternal Grandmother   ? Arthritis Maternal Grandmother   ? Hypertension Maternal Grandmother   ? Cancer Maternal Grandfather   ? Heart disease Maternal Grandfather   ? Arthritis Maternal  Grandfather   ? Hypertension Maternal Grandfather   ? Diabetes Paternal Grandmother   ? Cancer Paternal Grandmother   ? Arthritis Paternal Grandmother   ? Hypertension Paternal Grandmother   ? Diabetes Paternal Grandfather   ? Heart disease Paternal Grandfather   ? Arthritis Paternal Grandfather   ? Hypertension Paternal Grandfather   ? Cancer Maternal Uncle   ?     Colon  ? Breast cancer Paternal Aunt 35  ? ? ?Social History  ? ?Socioeconomic History  ? Marital status: Married  ?  Spouse name: Not on file  ? Number of children: 2  ? Years of education: Not on file  ? Highest education level: Some college, no degree  ?Occupational History  ? Not on file  ?Tobacco Use  ? Smoking status: Never  ? Smokeless tobacco: Never  ?Substance and Sexual Activity  ? Alcohol use: No  ?  Alcohol/week: 0.0 standard drinks  ? Drug use: No  ? Sexual activity: Yes  ?Other Topics Concern  ? Not on file  ?Social History Narrative  ? Not on file  ? ?Social Determinants of Health  ? ?Financial Resource Strain: Not on file  ?Food Insecurity: Not on file  ?Transportation Needs: Not on file  ?Physical Activity: Not on file  ?Stress: Not on file  ?Social Connections: Not on file  ?Intimate Partner Violence: Not on file  ? ? ? ?Constitutional: Denies fever, malaise, fatigue, headache or abrupt weight changes.  ?HEENT: Denies eye pain, eye redness, ear pain, ringing in the ears, wax buildup, runny nose, nasal congestion, bloody nose, or sore throat. ?Respiratory: Denies difficulty breathing, shortness of breath, cough or sputum production.   ?Cardiovascular: Denies chest pain, chest tightness, palpitations or swelling in the hands or feet.  ?Gastrointestinal: Pt reports intermittent vomiting and diarrhea. Denies abdominal pain, bloating, constipation, or blood in the stool.  ?GU: Denies urgency, frequency, pain with urination, burning sensation, blood in urine, odor or discharge. ?Musculoskeletal: Denies decrease in range of motion, difficulty  with gait, muscle pain or joint pain and swelling.  ?Skin: Denies redness, rashes, lesions or ulcercations.  ?Neurological: Denies dizziness, difficulty with memory, difficulty with speech or problems with balance and coordination.  ?Psych: Patient has a history of anxiety and depression.  Denies SI/HI. ? ?No other specific complaints in a complete review of systems (except as listed in HPI above). ? ?Objective:  ? Physical Exam ? ? ?BP 124/78 (BP Location: Left Arm, Patient Position: Sitting, Cuff Size: Large)   Pulse (!) 103  Temp (!) 97.3 ?F (36.3 ?C) (Temporal)   Wt 189 lb (85.7 kg)   LMP 02/01/2016 Comment: irregular  SpO2 97%   BMI 34.02 kg/m?  ? ?Wt Readings from Last 3 Encounters:  ?04/12/21 201 lb (91.2 kg)  ?01/04/21 199 lb 12.8 oz (90.6 kg)  ?12/29/20 199 lb 3.2 oz (90.4 kg)  ? ? ?General: Appears her stated age, obese, in NAD. ?Skin: Warm, dry and intact. No ulcerations noted. ?HEENT: Head: normal shape and size; Eyes: sclera white, no icterus, conjunctiva pink, PERRLA and EOMs intact;  ?Cardiovascular: Normal rate and rhythm. S1,S2 noted.  No murmur, rubs or gallops noted. No JVD or BLE edema. No carotid bruits noted. ?Pulmonary/Chest: Normal effort and positive vesicular breath sounds. No respiratory distress. No wheezes, rales or ronchi noted.  ?Abdomen: Soft and nontender. Normal bowel sounds.  ?Neurological: Alert and oriented. Cranial nerves II-XII grossly intact. Coordination normal.  ?Psychiatric: Mood and affect normal. Behavior is normal. Judgment and thought content normal.  ? ? ?BMET ?   ?Component Value Date/Time  ? NA 136 01/04/2021 0908  ? K 4.8 01/04/2021 0908  ? CL 100 01/04/2021 0908  ? CO2 25 01/04/2021 0908  ? GLUCOSE 237 (H) 01/04/2021 0908  ? BUN 13 01/04/2021 0908  ? CREATININE 0.65 01/04/2021 0908  ? CALCIUM 9.4 01/04/2021 0908  ? GFRNONAA >60 05/15/2019 0410  ? GFRAA >60 05/15/2019 0410  ? ? ?Lipid Panel  ?   ?Component Value Date/Time  ? CHOL 233 (H) 01/04/2021 0908  ?  TRIG 154 (H) 01/04/2021 0908  ? HDL 65 01/04/2021 0908  ? CHOLHDL 3.6 01/04/2021 0908  ? VLDL 27.4 05/29/2019 1627  ? LDLCALC 139 (H) 01/04/2021 0908  ? ? ?CBC ?   ?Component Value Date/Time  ? WBC 10.7 01/04/2021 0908  ?

## 2021-07-14 NOTE — Assessment & Plan Note (Signed)
CBC with differential today ?She is not currently taking Aspirin ?

## 2021-07-15 LAB — MICROALBUMIN / CREATININE URINE RATIO
Creatinine, Urine: 340 mg/dL — ABNORMAL HIGH (ref 20–275)
Microalb Creat Ratio: 12 mcg/mg creat (ref <30)
Microalb, Ur: 4.1 mg/dL

## 2021-07-15 LAB — COMPLETE METABOLIC PANEL WITH GFR
AG Ratio: 1.3 (calc) (ref 1.0–2.5)
ALT: 9 U/L (ref 6–29)
AST: 11 U/L (ref 10–35)
Albumin: 4 g/dL (ref 3.6–5.1)
Alkaline phosphatase (APISO): 86 U/L (ref 37–153)
BUN: 15 mg/dL (ref 7–25)
CO2: 25 mmol/L (ref 20–32)
Calcium: 9.4 mg/dL (ref 8.6–10.4)
Chloride: 105 mmol/L (ref 98–110)
Creat: 0.73 mg/dL (ref 0.50–1.03)
Globulin: 3 g/dL (calc) (ref 1.9–3.7)
Glucose, Bld: 96 mg/dL (ref 65–139)
Potassium: 4.5 mmol/L (ref 3.5–5.3)
Sodium: 140 mmol/L (ref 135–146)
Total Bilirubin: 0.5 mg/dL (ref 0.2–1.2)
Total Protein: 7 g/dL (ref 6.1–8.1)
eGFR: 96 mL/min/{1.73_m2} (ref >=60)

## 2021-07-15 LAB — CBC WITH DIFFERENTIAL/PLATELET
Absolute Monocytes: 858 cells/uL (ref 200–950)
Basophils Absolute: 117 cells/uL (ref 0–200)
Basophils Relative: 0.6 %
Eosinophils Absolute: 4641 cells/uL — ABNORMAL HIGH (ref 15–500)
Eosinophils Relative: 23.8 %
HCT: 43.1 % (ref 35.0–45.0)
Hemoglobin: 14 g/dL (ref 11.7–15.5)
Lymphs Abs: 3237 cells/uL (ref 850–3900)
MCH: 27.6 pg (ref 27.0–33.0)
MCHC: 32.5 g/dL (ref 32.0–36.0)
MCV: 85 fL (ref 80.0–100.0)
MPV: 10.2 fL (ref 7.5–12.5)
Monocytes Relative: 4.4 %
Neutro Abs: 10647 cells/uL — ABNORMAL HIGH (ref 1500–7800)
Neutrophils Relative %: 54.6 %
Platelets: 416 10*3/uL — ABNORMAL HIGH (ref 140–400)
RBC: 5.07 10*6/uL (ref 3.80–5.10)
RDW: 13.5 % (ref 11.0–15.0)
Total Lymphocyte: 16.6 %
WBC: 19.5 10*3/uL — ABNORMAL HIGH (ref 3.8–10.8)

## 2021-07-15 LAB — LIPID PANEL
Cholesterol: 121 mg/dL (ref <200)
HDL: 43 mg/dL — ABNORMAL LOW (ref >=50)
LDL Cholesterol (Calc): 57 mg/dL (calc)
Non-HDL Cholesterol (Calc): 78 mg/dL (calc) (ref <130)
Total CHOL/HDL Ratio: 2.8 (calc) (ref <5.0)
Triglycerides: 120 mg/dL (ref <150)

## 2021-07-15 LAB — HEMOGLOBIN A1C
Hgb A1c MFr Bld: 7.6 % of total Hgb — ABNORMAL HIGH (ref <5.7)
Mean Plasma Glucose: 171 mg/dL
eAG (mmol/L): 9.5 mmol/L

## 2021-07-16 ENCOUNTER — Other Ambulatory Visit: Payer: Self-pay | Admitting: Internal Medicine

## 2021-07-16 DIAGNOSIS — D72829 Elevated white blood cell count, unspecified: Secondary | ICD-10-CM

## 2021-07-19 ENCOUNTER — Other Ambulatory Visit (INDEPENDENT_AMBULATORY_CARE_PROVIDER_SITE_OTHER): Payer: BC Managed Care – PPO

## 2021-07-19 ENCOUNTER — Ambulatory Visit
Admission: RE | Admit: 2021-07-19 | Discharge: 2021-07-19 | Disposition: A | Discharge status: Home / Self Care | Payer: BC Managed Care – PPO | Source: Ambulatory Visit | Attending: Internal Medicine | Admitting: Internal Medicine

## 2021-07-19 ENCOUNTER — Ambulatory Visit
Admission: RE | Admit: 2021-07-19 | Discharge: 2021-07-19 | Disposition: A | Discharge status: Home / Self Care | Payer: BC Managed Care – PPO | Attending: Internal Medicine | Admitting: Internal Medicine

## 2021-07-19 ENCOUNTER — Other Ambulatory Visit: Payer: Self-pay | Admitting: Internal Medicine

## 2021-07-19 ENCOUNTER — Other Ambulatory Visit: Payer: BC Managed Care – PPO

## 2021-07-19 DIAGNOSIS — D72829 Elevated white blood cell count, unspecified: Secondary | ICD-10-CM

## 2021-07-19 LAB — CBC WITH DIFFERENTIAL/PLATELET
Absolute Monocytes: 884 cells/uL (ref 200–950)
Basophils Absolute: 145 cells/uL (ref 0–200)
Basophils Relative: 1.1 %
Eosinophils Absolute: 937 cells/uL — ABNORMAL HIGH (ref 15–500)
Eosinophils Relative: 7.1 %
HCT: 41.6 % (ref 35.0–45.0)
Hemoglobin: 13.4 g/dL (ref 11.7–15.5)
Lymphs Abs: 4079 cells/uL — ABNORMAL HIGH (ref 850–3900)
MCH: 27.5 pg (ref 27.0–33.0)
MCHC: 32.2 g/dL (ref 32.0–36.0)
MCV: 85.2 fL (ref 80.0–100.0)
MPV: 9.7 fL (ref 7.5–12.5)
Monocytes Relative: 6.7 %
Neutro Abs: 7154 cells/uL (ref 1500–7800)
Neutrophils Relative %: 54.2 %
Platelets: 393 10*3/uL (ref 140–400)
RBC: 4.88 10*6/uL (ref 3.80–5.10)
RDW: 13.8 % (ref 11.0–15.0)
Total Lymphocyte: 30.9 %
WBC: 13.2 10*3/uL — ABNORMAL HIGH (ref 3.8–10.8)

## 2021-07-19 LAB — POCT URINALYSIS DIPSTICK
Bilirubin, UA: NEGATIVE
Blood, UA: NEGATIVE
Glucose, UA: NEGATIVE
Ketones, UA: NEGATIVE
Nitrite, UA: POSITIVE
Protein, UA: POSITIVE — AB
Spec Grav, UA: 1.02 (ref 1.010–1.025)
Urobilinogen, UA: 0.2 E.U./dL
pH, UA: 5 (ref 5.0–8.0)

## 2021-07-19 MED ORDER — NITROFURANTOIN MONOHYD MACRO 100 MG PO CAPS: 100.0000 mg | ORAL_CAPSULE | Freq: Two times a day (BID) | ORAL | 0 refills | Status: DC

## 2021-07-21 LAB — URINE CULTURE
MICRO NUMBER:: 13365644
SPECIMEN QUALITY:: ADEQUATE

## 2021-08-06 ENCOUNTER — Other Ambulatory Visit: Payer: Self-pay | Admitting: Internal Medicine

## 2021-08-09 ENCOUNTER — Other Ambulatory Visit: Payer: Self-pay | Admitting: Internal Medicine

## 2021-08-10 NOTE — Telephone Encounter (Signed)
Requested medication (s) are due for refill today: yes  Requested medication (s) are on the active medication list: no  Last refill:  05/13/21  Future visit scheduled: yes  Notes to clinic:  rx not listed on pt's med list. Please advise      Requested Prescriptions  Pending Prescriptions Disp Refills   buPROPion (WELLBUTRIN XL) 150 MG 24 hr tablet [Pharmacy Med Name: BUPROPION HCL XL 150 MG TABLET] 90 tablet 0    Sig: Take 1 tablet (150 mg total) by mouth daily.     Psychiatry: Antidepressants - bupropion Passed - 08/09/2021 10:51 AM      Passed - Cr in normal range and within 360 days    Creat  Date Value Ref Range Status  07/14/2021 0.73 0.50 - 1.03 mg/dL Final   Creatinine, Urine  Date Value Ref Range Status  07/14/2021 340 (H) 20 - 275 mg/dL Final         Passed - AST in normal range and within 360 days    AST  Date Value Ref Range Status  07/14/2021 11 10 - 35 U/L Final         Passed - ALT in normal range and within 360 days    ALT  Date Value Ref Range Status  07/14/2021 9 6 - 29 U/L Final         Passed - Completed PHQ-2 or PHQ-9 in the last 360 days      Passed - Last BP in normal range    BP Readings from Last 1 Encounters:  07/14/21 124/78         Passed - Valid encounter within last 6 months    Recent Outpatient Visits           3 weeks ago Anxiety and depression   [place], [place] W, NP   4 months ago Type 2 diabetes mellitus with hyperglycemia, without long-term current use of insulin [facility])   [facility], Coralie Keens, NP   7 months ago Encounter for general adult medical examination with abnormal findings   [facility], Coralie Keens, NP   10 months ago Type 2 diabetes mellitus with hyperglycemia, without long-term current use of insulin [facility])   [facility] [place], Coralie Keens, NP   9 years ago Low back pain   [facility], Buren Kos, DO       Future  Appointments             In 2 months Baity, Coralie Keens, NP [facility], [facility]

## 2021-08-10 NOTE — Telephone Encounter (Signed)
rx was sent to pharmacy on 07/14/21 #180/0 E-Prescribing Status: Receipt confirmed by pharmacy (07/14/2021 8:25 AM EDT) Requested Prescriptions  Pending Prescriptions Disp Refills  . glipiZIDE (GLUCOTROL) 10 MG tablet [Pharmacy Med Name: GLIPIZIDE 10 MG TABLET] 60 tablet 0    Sig: Take 1 tablet (10 mg total) by mouth 2 (two) times daily before a meal.     Endocrinology:  Diabetes - Sulfonylureas Passed - 08/06/2021  1:29 PM      Passed - HBA1C is between 0 and 7.9 and within 180 days    Hgb A1c MFr Bld  Date Value Ref Range Status  07/14/2021 7.6 (H) <5.7 % of total Hgb Final    Comment:    For someone without known diabetes, a hemoglobin A1c value of 6.5% or greater indicates that they may have  diabetes and this should be confirmed with a follow-up  test. . For someone with known diabetes, a value <7% indicates  that their diabetes is well controlled and a value  greater than or equal to 7% indicates suboptimal  control. A1c targets should be individualized based on  duration of diabetes, age, comorbid conditions, and  other considerations. . Currently, no consensus exists regarding use of hemoglobin A1c for diagnosis of diabetes for children. .          Passed - Cr in normal range and within 360 days    Creat  Date Value Ref Range Status  07/14/2021 0.73 0.50 - 1.03 mg/dL Final   Creatinine, Urine  Date Value Ref Range Status  07/14/2021 340 (H) 20 - 275 mg/dL Final         Passed - Valid encounter within last 6 months    Recent Outpatient Visits          3 weeks ago Anxiety and depression   [facility] [place], [place] W, NP   4 months ago Type 2 diabetes mellitus with hyperglycemia, without long-term current use of insulin [facility])   [facility], Salvadore Oxford, NP   7 months ago Encounter for general adult medical examination with abnormal findings   [facility] [place], Salvadore Oxford, NP   10 months ago Type 2 diabetes mellitus  with hyperglycemia, without long-term current use of insulin [facility])   [facility] [place], Salvadore Oxford, NP   9 years ago Low back pain   [facility], Mitchel [place], DO      Future Appointments            In 2 months Baity, Salvadore Oxford, NP [facility], [facility]

## 2021-08-11 ENCOUNTER — Encounter: Payer: Self-pay | Admitting: Internal Medicine

## 2021-08-11 NOTE — Telephone Encounter (Signed)
Please review.  She was here for increase WBCs and was positive for UTI.  Do you need to see her back?   Thanks,   -Vernona Rieger

## 2021-08-11 NOTE — Telephone Encounter (Signed)
That was almost a month ago.  Or at least can have to recheck her urine.

## 2021-08-12 ENCOUNTER — Ambulatory Visit: Payer: Self-pay

## 2021-08-12 NOTE — Telephone Encounter (Signed)
Summary: UTI   Pt had a UTI and now her UTI is back and worsening / please advise / pt wants to come today     Called pt but heard message- phone not in service on first try nd second attempt "mailbox ful''.

## 2021-08-12 NOTE — Telephone Encounter (Signed)
2nd attempt to return call regarding UTI symptoms. Unable to leave message because the mailbox is full so not able to accept messages at this time.

## 2021-08-12 NOTE — Telephone Encounter (Signed)
  Chief Complaint: Urinary burning Symptoms: urinary frequency, chills in the evenings,  Frequency: yesterday Pertinent Negatives: Patient denies blood in urine Disposition: [] ED /[x] Urgent Care (no appt availability in office) / [] Appointment(In office/virtual)/ []  [place] [facility]/ [] Home Care/ [] Refused Recommended Disposition /[] [place] [facility]/ []  Follow-up with PCP Additional Notes: No available appts advised pt to go to UC      Reason for Disposition  Age > 50 years  Answer Assessment - Initial Assessment Questions 1. SEVERITY: "How bad is the pain?"  (e.g., Scale 1-10; mild, moderate, or severe)   - MILD (1-3): complains slightly about urination hurting   - MODERATE (4-7): interferes with normal activities     - SEVERE (8-10): excruciating, unwilling or unable to urinate because of the pain      moderate 2. FREQUENCY: "How many times have you had painful urination today?"      4 3. PATTERN: "Is pain present every time you urinate or just sometimes?"      yes 4. ONSET: "When did the painful urination start?"      yesterday 5. FEVER: "Do you have a fever?" If Yes, ask: "What is your temperature, how was it measured, and when did it start?"     Subjective fever- chills 6. PAST UTI: "Have you had a urine infection before?" If Yes, ask: "When was the last time?" and "What happened that time?"      Yes-May 2023 first week did a UA- no sx 7. CAUSE: "What do you think is causing the painful urination?"  (e.g., UTI, scratch, Herpes sore)     UTI 8. OTHER SYMPTOMS: "Do you have any other symptoms?" (e.g., flank pain, vaginal discharge, genital sores, urgency, blood in urine)     Urgency, burning,  bladder pressure 9. PREGNANCY: "Is there any chance you are pregnant?" "When was your last menstrual period?"     N/a  Protocols used: Urination Pain - Female-A-AH

## 2021-08-18 ENCOUNTER — Other Ambulatory Visit: Payer: Self-pay | Admitting: Internal Medicine

## 2021-08-18 NOTE — Telephone Encounter (Signed)
Requested medication (s) are due for refill today:   No  Requested medication (s) are on the active medication list:   No  Future visit scheduled:   Yes   Last ordered: This was discontinued 07/14/2021 per chart.     Requested Prescriptions  Pending Prescriptions Disp Refills   buPROPion (WELLBUTRIN XL) 150 MG 24 hr tablet [Pharmacy Med Name: BUPROPION HCL XL 150 MG TABLET] 90 tablet 0    Sig: Take 1 tablet (150 mg total) by mouth daily.     Psychiatry: Antidepressants - bupropion Passed - 08/18/2021 12:17 PM      Passed - Cr in normal range and within 360 days    Creat  Date Value Ref Range Status  07/14/2021 0.73 0.50 - 1.03 mg/dL Final   Creatinine, Urine  Date Value Ref Range Status  07/14/2021 340 (H) 20 - 275 mg/dL Final         Passed - AST in normal range and within 360 days    AST  Date Value Ref Range Status  07/14/2021 11 10 - 35 U/L Final         Passed - ALT in normal range and within 360 days    ALT  Date Value Ref Range Status  07/14/2021 9 6 - 29 U/L Final         Passed - Completed PHQ-2 or PHQ-9 in the last 360 days      Passed - Last BP in normal range    BP Readings from Last 1 Encounters:  07/14/21 124/78         Passed - Valid encounter within last 6 months    Recent Outpatient Visits           1 month ago Anxiety and depression   [place], [place] W, NP   4 months ago Type 2 diabetes mellitus with hyperglycemia, without long-term current use of insulin [facility])   [facility], Coralie Keens, NP   7 months ago Encounter for general adult medical examination with abnormal findings   [facility] [place], Coralie Keens, NP   10 months ago Type 2 diabetes mellitus with hyperglycemia, without long-term current use of insulin [facility])   [facility] [place], Coralie Keens, NP   9 years ago Low back pain   [facility], Buren Kos, DO       Future Appointments             In  2 months Baity, Coralie Keens, NP [facility], [facility]

## 2021-08-25 ENCOUNTER — Other Ambulatory Visit: Payer: Self-pay | Admitting: Internal Medicine

## 2021-08-25 NOTE — Telephone Encounter (Signed)
last RF 07/14/21 #90 Requested Prescriptions  Refused Prescriptions Disp Refills  . lisinopril (ZESTRIL) 20 MG tablet [Pharmacy Med Name: LISINOPRIL 20 MG TABLET] 90 tablet 0    Sig: Take 1 tablet (20 mg total) by mouth daily.     Cardiovascular:  ACE Inhibitors Passed - 08/25/2021 11:30 AM      Passed - Cr in normal range and within 180 days    Creat  Date Value Ref Range Status  07/14/2021 0.73 0.50 - 1.03 mg/dL Final   Creatinine, Urine  Date Value Ref Range Status  07/14/2021 340 (H) 20 - 275 mg/dL Final         Passed - K in normal range and within 180 days    Potassium  Date Value Ref Range Status  07/14/2021 4.5 3.5 - 5.3 mmol/L Final         Passed - Patient is not pregnant      Passed - Last BP in normal range    BP Readings from Last 1 Encounters:  07/14/21 124/78         Passed - Valid encounter within last 6 months    Recent Outpatient Visits          1 month ago Anxiety and depression   [facility] [place], [place] W, NP   4 months ago Type 2 diabetes mellitus with hyperglycemia, without long-term current use of insulin [facility])   [facility], Salvadore Oxford, NP   7 months ago Encounter for general adult medical examination with abnormal findings   [facility] [place], Salvadore Oxford, NP   11 months ago Type 2 diabetes mellitus with hyperglycemia, without long-term current use of insulin [facility])   [facility] [place], Salvadore Oxford, NP   9 years ago Low back pain   [facility], [place] L, DO      Future Appointments            In 1 month Baity, Salvadore Oxford, NP [facility], [facility]

## 2021-08-26 ENCOUNTER — Other Ambulatory Visit: Payer: Self-pay | Admitting: Internal Medicine

## 2021-08-26 NOTE — Telephone Encounter (Signed)
rx was sent to pharmacy on 07/14/21 #90/0. E-Prescribing Status: Receipt confirmed by pharmacy (07/14/2021 8:25 AM EDT) Requested Prescriptions  Pending Prescriptions Disp Refills  . lisinopril (ZESTRIL) 20 MG tablet [Pharmacy Med Name: LISINOPRIL 20 MG TABLET] 90 tablet 0    Sig: Take 1 tablet (20 mg total) by mouth daily.     Cardiovascular:  ACE Inhibitors Passed - 08/26/2021 12:07 PM      Passed - Cr in normal range and within 180 days    Creat  Date Value Ref Range Status  07/14/2021 0.73 0.50 - 1.03 mg/dL Final   Creatinine, Urine  Date Value Ref Range Status  07/14/2021 340 (H) 20 - 275 mg/dL Final         Passed - K in normal range and within 180 days    Potassium  Date Value Ref Range Status  07/14/2021 4.5 3.5 - 5.3 mmol/L Final         Passed - Patient is not pregnant      Passed - Last BP in normal range    BP Readings from Last 1 Encounters:  07/14/21 124/78         Passed - Valid encounter within last 6 months    Recent Outpatient Visits          1 month ago Anxiety and depression   [facility] [place], [place] W, NP   4 months ago Type 2 diabetes mellitus with hyperglycemia, without long-term current use of insulin [facility])   [facility], Salvadore Oxford, NP   7 months ago Encounter for general adult medical examination with abnormal findings   [facility] [place], Salvadore Oxford, NP   11 months ago Type 2 diabetes mellitus with hyperglycemia, without long-term current use of insulin [facility])   [facility] [place], Salvadore Oxford, NP   9 years ago Low back pain   [facility], [place] L, DO      Future Appointments            In 1 month Baity, Salvadore Oxford, NP [facility], [facility]

## 2021-09-18 ENCOUNTER — Other Ambulatory Visit: Payer: Self-pay | Admitting: Internal Medicine

## 2021-09-20 NOTE — Telephone Encounter (Signed)
Requested Prescriptions  Pending Prescriptions Disp Refills  . hydrOXYzine (VISTARIL) 50 MG capsule [Pharmacy Med Name: HYDROXYZINE PAM 50 MG CAP] 90 capsule 0    Sig: Take 1 capsule (50 mg total) by mouth at bedtime.     Ear, Nose, and Throat:  Antihistamines 2 Passed - 09/18/2021 12:37 PM      Passed - Cr in normal range and within 360 days    Creat  Date Value Ref Range Status  07/14/2021 0.73 0.50 - 1.03 mg/dL Final   Creatinine, Urine  Date Value Ref Range Status  07/14/2021 340 (H) 20 - 275 mg/dL Final         Passed - Valid encounter within last 12 months    Recent Outpatient Visits          2 months ago Anxiety and depression   [facility] [place], [place] W, NP   5 months ago Type 2 diabetes mellitus with hyperglycemia, without long-term current use of insulin [facility])   [facility], Salvadore Oxford, NP   8 months ago Encounter for general adult medical examination with abnormal findings   [facility] [place], Salvadore Oxford, NP   11 months ago Type 2 diabetes mellitus with hyperglycemia, without long-term current use of insulin [facility])   [facility] [place], Salvadore Oxford, NP   9 years ago Low back pain   [facility], Mitchel Honour, DO      Future Appointments            In 4 weeks Baity, Salvadore Oxford, NP [facility], [facility]

## 2021-09-30 ENCOUNTER — Other Ambulatory Visit: Payer: Self-pay | Admitting: Internal Medicine

## 2021-10-01 ENCOUNTER — Other Ambulatory Visit: Payer: Self-pay | Admitting: Internal Medicine

## 2021-10-01 NOTE — Telephone Encounter (Signed)
dc'd 04/12/21 by Avie Echevaria NP  Requested Prescriptions  Refused Prescriptions Disp Refills  . metFORMIN (GLUCOPHAGE) 1000 MG tablet [Pharmacy Med Name: METFORMIN HCL 1,000 MG TABLET] 180 tablet 0    Sig: Take 1 tablet (1,000 mg total) by mouth 2 (two) times daily with a meal.     Endocrinology:  Diabetes - Biguanides Failed - 10/01/2021 12:22 PM      Failed - B12 Level in normal range and within 720 days    No results found for: "VITAMINB12"       Passed - Cr in normal range and within 360 days    Creat  Date Value Ref Range Status  07/14/2021 0.73 0.50 - 1.03 mg/dL Final   Creatinine, Urine  Date Value Ref Range Status  07/14/2021 340 (H) 20 - 275 mg/dL Final         Passed - HBA1C is between 0 and 7.9 and within 180 days    Hgb A1c MFr Bld  Date Value Ref Range Status  07/14/2021 7.6 (H) <5.7 % of total Hgb Final    Comment:    For someone without known diabetes, a hemoglobin A1c value of 6.5% or greater indicates that they may have  diabetes and this should be confirmed with a follow-up  test. . For someone with known diabetes, a value <7% indicates  that their diabetes is well controlled and a value  greater than or equal to 7% indicates suboptimal  control. A1c targets should be individualized based on  duration of diabetes, age, comorbid conditions, and  other considerations. . Currently, no consensus exists regarding use of hemoglobin A1c for diagnosis of diabetes for children. .          Passed - eGFR in normal range and within 360 days    GFR calc Af Amer  Date Value Ref Range Status  05/15/2019 >60 >60 mL/min Final   GFR calc non Af Amer  Date Value Ref Range Status  05/15/2019 >60 >60 mL/min Final   GFR  Date Value Ref Range Status  02/27/2020 96.20 >60.00 mL/min Final    Comment:    Calculated using the CKD-EPI Creatinine Equation (2021)   eGFR  Date Value Ref Range Status  07/14/2021 96 > OR = 60 mL/min/1.26m Final    Comment:    The eGFR is  based on the CKD-EPI 2021 equation. To calculate  the new eGFR from a previous Creatinine or Cystatin C result, go to https://www.kidney.org/professionals/ kdoqi/gfr%5Fcalculator          Passed - Valid encounter within last 6 months    Recent Outpatient Visits          2 months ago Anxiety and depression   [facility] [place]   5 months ago Type 2 diabetes mellitus with hyperglycemia, without long-term current use of insulin ([facility]   [facility]   9 months ago Encounter for general adult medical examination with abnormal findings   [facility]   1 year ago Type 2 diabetes mellitus with hyperglycemia, without long-term current use of insulin ([facility]   [facility]   9 years ago Low back pain   [facility] Kos DO      Future Appointments            In 2 weeks BJearld Fenton NP SRocco Se[place]  [facility], [facility]           Passed - CBC within normal limits and completed in the last 12 months    WBC  Date Value Ref Range Status  07/19/2021 13.2 (H) 3.8 - 10.8 Thousand/uL Final   RBC  Date Value Ref Range Status  07/19/2021 4.88 3.80 - 5.10 Million/uL Final   Hemoglobin  Date Value Ref Range Status  07/19/2021 13.4 11.7 - 15.5 g/dL Final   HCT  Date Value Ref Range Status  07/19/2021 41.6 35.0 - 45.0 % Final   MCHC  Date Value Ref Range Status  07/19/2021 32.2 32.0 - 36.0 g/dL Final   [facility]  Date Value Ref Range Status  07/19/2021 27.5 27.0 - 33.0 pg Final   MCV  Date Value Ref Range Status  07/19/2021 85.2 80.0 - 100.0 fL Final   No results found for: "PLTCOUNTKUC", "LABPLAT", "POCPLA" RDW  Date Value Ref Range Status  07/19/2021 13.8 11.0 - 15.0 % Final

## 2021-10-01 NOTE — Telephone Encounter (Signed)
Requested medication (s) are due for refill today: no  Requested medication (s) are on the active medication list: no  Last refill:    Future visit scheduled: yes  Notes to clinic:  rx was discontinued on 04/12/2021 d/t GI side effects. Please advise for refill     Requested Prescriptions  Pending Prescriptions Disp Refills   metFORMIN (GLUCOPHAGE) 1000 MG tablet [Pharmacy Med Name: METFORMIN HCL 1,000 MG TABLET] 180 tablet 0    Sig: Take 1 tablet (1,000 mg total) by mouth 2 (two) times daily with a meal.     Endocrinology:  Diabetes - Biguanides Failed - 09/30/2021 11:46 AM      Failed - B12 Level in normal range and within 720 days    No results found for: "VITAMINB12"       Passed - Cr in normal range and within 360 days    Creat  Date Value Ref Range Status  07/14/2021 0.73 0.50 - 1.03 mg/dL Final   Creatinine, Urine  Date Value Ref Range Status  07/14/2021 340 (H) 20 - 275 mg/dL Final         Passed - HBA1C is between 0 and 7.9 and within 180 days    Hgb A1c MFr Bld  Date Value Ref Range Status  07/14/2021 7.6 (H) <5.7 % of total Hgb Final    Comment:    For someone without known diabetes, a hemoglobin A1c value of 6.5% or greater indicates that they may have  diabetes and this should be confirmed with a follow-up  test. . For someone with known diabetes, a value <7% indicates  that their diabetes is well controlled and a value  greater than or equal to 7% indicates suboptimal  control. A1c targets should be individualized based on  duration of diabetes, age, comorbid conditions, and  other considerations. . Currently, no consensus exists regarding use of hemoglobin A1c for diagnosis of diabetes for children. .          Passed - eGFR in normal range and within 360 days    GFR calc Af Amer  Date Value Ref Range Status  05/15/2019 >60 >60 mL/min Final   GFR calc non Af Amer  Date Value Ref Range Status  05/15/2019 >60 >60 mL/min Final   GFR  Date  Value Ref Range Status  02/27/2020 96.20 >60.00 mL/min Final    Comment:    Calculated using the CKD-EPI Creatinine Equation (2021)   eGFR  Date Value Ref Range Status  07/14/2021 96 > OR = 60 mL/min/1.10m Final    Comment:    The eGFR is based on the CKD-EPI 2021 equation. To calculate  the new eGFR from a previous Creatinine or Cystatin C result, go to https://www.kidney.org/professionals/ kdoqi/gfr%5Fcalculator          Passed - Valid encounter within last 6 months    Recent Outpatient Visits           2 months ago Anxiety and depression   [facility]   5 months ago Type 2 diabetes mellitus with hyperglycemia, without long-term current use of insulin ([facility]   [facility]   9 months ago Encounter for general adult medical examination with abnormal findings   [facility]   1 year ago Type 2 diabetes mellitus with hyperglycemia, without long-term current use of insulin ([facility]   [facility] [place]  NP   9 years ago Low back pain   [facility], [place], DO       Future Appointments             In 2 weeks Baity, Coralie Keens, NP [facility] [place] within normal limits and completed in the last 12 months    WBC  Date Value Ref Range Status  07/19/2021 13.2 (H) 3.8 - 10.8 Thousand/uL Final   RBC  Date Value Ref Range Status  07/19/2021 4.88 3.80 - 5.10 Million/uL Final   Hemoglobin  Date Value Ref Range Status  07/19/2021 13.4 11.7 - 15.5 g/dL Final   HCT  Date Value Ref Range Status  07/19/2021 41.6 35.0 - 45.0 % Final   MCHC  Date Value Ref Range Status  07/19/2021 32.2 32.0 - 36.0 g/dL Final   [facility]  Date Value Ref Range Status  07/19/2021 27.5 27.0 - 33.0 pg Final   MCV  Date Value Ref Range Status  07/19/2021 85.2 80.0 - 100.0 fL Final   No results found for:  "PLTCOUNTKUC", "LABPLAT", "POCPLA" RDW  Date Value Ref Range Status  07/19/2021 13.8 11.0 - 15.0 % Final

## 2021-10-04 ENCOUNTER — Other Ambulatory Visit: Payer: Self-pay | Admitting: Internal Medicine

## 2021-10-05 NOTE — Telephone Encounter (Signed)
Requested Prescriptions  Pending Prescriptions Disp Refills  . OZEMPIC, 1 MG/DOSE, 4 MG/3ML SOPN [Pharmacy Med Name: OZEMPIC 1 MG/DOSE (4 MG/3 ML)] 9 mL 0    Sig: Inject 1 mg into the skin once a week.     Endocrinology:  Diabetes - GLP-1 Receptor Agonists - semaglutide Failed - 10/04/2021 11:17 AM      Failed - HBA1C in normal range and within 180 days    Hgb A1c MFr Bld  Date Value Ref Range Status  07/14/2021 7.6 (H) <5.7 % of total Hgb Final    Comment:    For someone without known diabetes, a hemoglobin A1c value of 6.5% or greater indicates that they may have  diabetes and this should be confirmed with a follow-up  test. . For someone with known diabetes, a value <7% indicates  that their diabetes is well controlled and a value  greater than or equal to 7% indicates suboptimal  control. A1c targets should be individualized based on  duration of diabetes, age, comorbid conditions, and  other considerations. . Currently, no consensus exists regarding use of hemoglobin A1c for diagnosis of diabetes for children. .          Passed - Cr in normal range and within 360 days    Creat  Date Value Ref Range Status  07/14/2021 0.73 0.50 - 1.03 mg/dL Final   Creatinine, Urine  Date Value Ref Range Status  07/14/2021 340 (H) 20 - 275 mg/dL Final         Passed - Valid encounter within last 6 months    Recent Outpatient Visits          2 months ago Anxiety and depression   [facility] [place], [place] W, NP   5 months ago Type 2 diabetes mellitus with hyperglycemia, without long-term current use of insulin [facility])   [facility], Salvadore Oxford, NP   9 months ago Encounter for general adult medical examination with abnormal findings   [facility] [place], Salvadore Oxford, NP   1 year ago Type 2 diabetes mellitus with hyperglycemia, without long-term current use of insulin [facility])   [facility] [place], Salvadore Oxford, NP   9 years ago  Low back pain   [facility], Mitchel Honour, DO      Future Appointments            In 2 weeks Sampson Si, Salvadore Oxford, NP [facility], [facility]           . atorvastatin (LIPITOR) 20 MG tablet [Pharmacy Med Name: ATORVASTATIN 20 MG TABLET] 90 tablet 0    Sig: Take 1 tablet (20 mg total) by mouth daily.     Cardiovascular:  Antilipid - Statins Failed - 10/04/2021 11:17 AM      Failed - Lipid Panel in normal range within the last 12 months    Cholesterol  Date Value Ref Range Status  07/14/2021 121 <200 mg/dL Final   LDL Cholesterol (Calc)  Date Value Ref Range Status  07/14/2021 57 mg/dL (calc) Final    Comment:    Reference range: <100 . Desirable range <100 mg/dL for primary prevention;   <70 mg/dL for patients with CHD or diabetic patients  with > or = 2 CHD risk factors. Marland Kitchen LDL-C is now calculated using the Martin-Hopkins  calculation, which is a validated novel method providing  better accuracy than the Friedewald equation in the  estimation  of LDL-C.  Horald Pollen et al. Lenox Ahr. 1950;932(67): 2061-2068  (http://education.[employer].com/faq/FAQ164)    Direct LDL  Date Value Ref Range Status  01/15/2019 150.0 mg/dL Final    Comment:    Optimal:  <100 mg/dLNear or Above Optimal:  100-129 mg/dLBorderline High:  130-159 mg/dLHigh:  160-189 mg/dLVery High:  >190 mg/dL   HDL  Date Value Ref Range Status  07/14/2021 43 (L) > OR = 50 mg/dL Final   Triglycerides  Date Value Ref Range Status  07/14/2021 120 <150 mg/dL Final         Passed - Patient is not pregnant      Passed - Valid encounter within last 12 months    Recent Outpatient Visits          2 months ago Anxiety and depression   [facility] [place], Salvadore Oxford, NP   5 months ago Type 2 diabetes mellitus with hyperglycemia, without long-term current use of insulin [facility])   [facility], Salvadore Oxford, NP   9 months ago Encounter for general adult medical  examination with abnormal findings   [facility] [place], Salvadore Oxford, NP   1 year ago Type 2 diabetes mellitus with hyperglycemia, without long-term current use of insulin [facility])   [facility] [place], Salvadore Oxford, NP   9 years ago Low back pain   [facility], Mitchel Honour, DO      Future Appointments            In 2 weeks Sampson Si, Salvadore Oxford, NP [facility], [facility]

## 2021-10-19 ENCOUNTER — Ambulatory Visit: Payer: BC Managed Care – PPO | Admitting: Internal Medicine

## 2021-10-19 NOTE — Progress Notes (Deleted)
Subjective:    Patient ID: Breanna Day, female    DOB: [birth year], 56 y.o.   MRN: [id number]  HPI  Patient presents to clinic today for 55-month follow-up of HTN, HLD and DM2.  HTN: Her BP today is.  She is taking Lisinopril as prescribed.  There is no ECG on file.  HLD: Her last LDL was 57, triglycerides 469, 07/2021.  She denies myalgias on Atorvastatin.  She tries to consume low-fat diet.  DM2: Her last A1c was 7.6%, 07/2021.  She is taking Glipizide and Ozempic as prescribed.  She is on lisinopril for renal protection.  She checks her feet routinely.  Her last eye exam was 03/2021.  Flu 12/2020.  Pneumovax 12/2020.  COVID x 2.  Review of Systems     Past Medical History:  Diagnosis Date   Allergy    Chicken pox    Depression    Diabetes mellitus without complication (HCC)    Hypertension     Current Outpatient Medications  Medication Sig Dispense Refill   aspirin EC 81 MG tablet Take 81 mg by mouth daily. (Patient not taking: Reported on 07/14/2021)     atorvastatin (LIPITOR) 20 MG tablet Take 1 tablet (20 mg total) by mouth daily. 90 tablet 0   Biotin 10 MG TABS Take 10 mg by mouth daily.     clobetasol cream (TEMOVATE) 0.05 % Apply 1 application topically 2 (two) times daily. On affected area 30 g 0   glipiZIDE (GLUCOTROL) 10 MG tablet Take 1 tablet (10 mg total) by mouth 2 (two) times daily before a meal. 180 tablet 0   hydrOXYzine (VISTARIL) 50 MG capsule Take 1 capsule (50 mg total) by mouth at bedtime. 90 capsule 0   lisinopril (ZESTRIL) 20 MG tablet Take 1 tablet (20 mg total) by mouth daily. 90 tablet 0   nitrofurantoin, macrocrystal-monohydrate, (MACROBID) 100 MG capsule Take 1 capsule (100 mg total) by mouth 2 (two) times daily. 10 capsule 0   OZEMPIC, 1 MG/DOSE, 4 MG/3ML SOPN Inject 1 mg into the skin once a week. 9 mL 0   pantoprazole (PROTONIX) 40 MG tablet Take 1 tablet (40 mg total) by mouth daily. 90 tablet 0   No current facility-administered medications for  this visit.    Allergies  Allergen Reactions   Sulfa Antibiotics Nausea Only   Elemental Sulfur Other (See Comments)    Muscle cramps     Family History  Problem Relation Age of Onset   Hypertension Mother    Arthritis Mother    Hyperlipidemia Mother    Hypertension Father    Arthritis Father    Hyperlipidemia Father    Diabetes Maternal Grandmother    Heart disease Maternal Grandmother    Arthritis Maternal Grandmother    Hypertension Maternal Grandmother    Cancer Maternal Grandfather    Heart disease Maternal Grandfather    Arthritis Maternal Grandfather    Hypertension Maternal Grandfather    Diabetes Paternal Grandmother    Cancer Paternal Grandmother    Arthritis Paternal Grandmother    Hypertension Paternal Grandmother    Diabetes Paternal Grandfather    Heart disease Paternal Grandfather    Arthritis Paternal Grandfather    Hypertension Paternal Grandfather    Cancer Maternal Uncle        Colon   Breast cancer Paternal Aunt 12    Social History   Socioeconomic History   Marital status: Married    Spouse name: Not on file  Number of children: 2   Years of education: Not on file   Highest education level: Some college, no degree  Occupational History   Not on file  Tobacco Use   Smoking status: Never   Smokeless tobacco: Never  Substance and Sexual Activity   Alcohol use: No    Alcohol/week: 0.0 standard drinks of alcohol   Drug use: No   Sexual activity: Yes  Other Topics Concern   Not on file  Social History Narrative   Not on file   Social Determinants of Health   Financial Resource Strain: Not on file  Food Insecurity: Not on file  Transportation Needs: Not on file  Physical Activity: Not on file  Stress: Not on file  Social Connections: Not on file  Intimate Partner Violence: Not on file     Constitutional: Denies fever, malaise, fatigue, headache or abrupt weight changes.  HEENT: Denies eye pain, eye redness, ear pain, ringing  in the ears, wax buildup, runny nose, nasal congestion, bloody nose, or sore throat. Respiratory: Denies difficulty breathing, shortness of breath, cough or sputum production.   Cardiovascular: Denies chest pain, chest tightness, palpitations or swelling in the hands or feet.  Gastrointestinal: Denies abdominal pain, bloating, constipation, diarrhea or blood in the stool.  GU: Denies urgency, frequency, pain with urination, burning sensation, blood in urine, odor or discharge. Musculoskeletal: Denies decrease in range of motion, difficulty with gait, muscle pain or joint pain and swelling.  Skin: Denies redness, rashes, lesions or ulcercations.  Neurological: Denies dizziness, difficulty with memory, difficulty with speech or problems with balance and coordination.    No other specific complaints in a complete review of systems (except as listed in HPI above).  Objective:   Physical Exam   LMP 02/01/2016 Comment: irregular Wt Readings from Last 3 Encounters:  07/14/21 189 lb (85.7 kg)  04/12/21 201 lb (91.2 kg)  01/04/21 199 lb 12.8 oz (90.6 kg)    General: Appears their stated age, well developed, well nourished in NAD. Skin: Warm, dry and intact. No rashes, lesions or ulcerations noted. HEENT: Head: normal shape and size; Eyes: sclera white, no icterus, conjunctiva pink, PERRLA and EOMs intact; Ears: Tm's gray and intact, normal light reflex; Nose: mucosa pink and moist, septum midline; Throat/Mouth: Teeth present, mucosa pink and moist, no exudate, lesions or ulcerations noted.  Neck:  Neck supple, trachea midline. No masses, lumps or thyromegaly present.  Cardiovascular: Normal rate and rhythm. S1,S2 noted.  No murmur, rubs or gallops noted. No JVD or BLE edema. No carotid bruits noted. Pulmonary/Chest: Normal effort and positive vesicular breath sounds. No respiratory distress. No wheezes, rales or ronchi noted.  Abdomen: Soft and nontender. Normal bowel sounds. No distention or  masses noted. Liver, spleen and kidneys non palpable. Musculoskeletal: Normal range of motion. No signs of joint swelling. No difficulty with gait.  Neurological: Alert and oriented. Cranial nerves II-XII grossly intact. Coordination normal.  Psychiatric: Mood and affect normal. Behavior is normal. Judgment and thought content normal.    BMET    Component Value Date/Time   NA 140 07/14/2021 0828   K 4.5 07/14/2021 0828   CL 105 07/14/2021 0828   CO2 25 07/14/2021 0828   GLUCOSE 96 07/14/2021 0828   BUN 15 07/14/2021 0828   CREATININE 0.73 07/14/2021 0828   CALCIUM 9.4 07/14/2021 0828   GFRNONAA >60 05/15/2019 0410   GFRAA >60 05/15/2019 0410    Lipid Panel     Component Value Date/Time  CHOL 121 07/14/2021 0828   TRIG 120 07/14/2021 0828   HDL 43 (L) 07/14/2021 0828   CHOLHDL 2.8 07/14/2021 0828   VLDL 27.4 05/29/2019 1627   LDLCALC 57 07/14/2021 0828    CBC    Component Value Date/Time   WBC 13.2 (H) 07/19/2021 0813   RBC 4.88 07/19/2021 0813   HGB 13.4 07/19/2021 0813   HCT 41.6 07/19/2021 0813   PLT 393 07/19/2021 0813   MCV 85.2 07/19/2021 0813   MCH 27.5 07/19/2021 0813   MCHC 32.2 07/19/2021 0813   RDW 13.8 07/19/2021 0813   LYMPHSABS 4,079 (H) 07/19/2021 0813   EOSABS 937 (H) 07/19/2021 0813   BASOSABS 145 07/19/2021 0813    Hgb A1C Lab Results  Component Value Date   HGBA1C 7.6 (H) 07/14/2021           Assessment & Plan:    RTC in 3 months for your annual exam Nicki Reaper, NP

## 2021-12-14 ENCOUNTER — Other Ambulatory Visit: Payer: Self-pay | Admitting: Internal Medicine

## 2021-12-14 NOTE — Telephone Encounter (Signed)
Requested Prescriptions  Pending Prescriptions Disp Refills  . glipiZIDE (GLUCOTROL) 10 MG tablet [Pharmacy Med Name: GLIPIZIDE 10 MG TABLET] 180 tablet 0    Sig: Take 1 tablet (10 mg total) by mouth 2 (two) times daily before a meal.     Endocrinology:  Diabetes - Sulfonylureas Passed - 12/14/2021 12:06 PM      Passed - HBA1C is between 0 and 7.9 and within 180 days    Hgb A1c MFr Bld  Date Value Ref Range Status  07/14/2021 7.6 (H) <5.7 % of total Hgb Final    Comment:    For someone without known diabetes, a hemoglobin A1c value of 6.5% or greater indicates that they may have  diabetes and this should be confirmed with a follow-up  test. . For someone with known diabetes, a value <7% indicates  that their diabetes is well controlled and a value  greater than or equal to 7% indicates suboptimal  control. A1c targets should be individualized based on  duration of diabetes, age, comorbid conditions, and  other considerations. . Currently, no consensus exists regarding use of hemoglobin A1c for diagnosis of diabetes for children. .          Passed - Cr in normal range and within 360 days    Creat  Date Value Ref Range Status  07/14/2021 0.73 0.50 - 1.03 mg/dL Final   Creatinine, Urine  Date Value Ref Range Status  07/14/2021 340 (H) 20 - 275 mg/dL Final         Passed - Valid encounter within last 6 months    Recent Outpatient Visits          5 months ago Anxiety and depression   [facility] [place], [place] W, NP   8 months ago Type 2 diabetes mellitus with hyperglycemia, without long-term current use of insulin [facility])   [facility], Coralie Keens, NP   11 months ago Encounter for general adult medical examination with abnormal findings   [facility] [place], Coralie Keens, NP   1 year ago Type 2 diabetes mellitus with hyperglycemia, without long-term current use of insulin [facility])   [place], NP    9 years ago Low back pain   [facility], [place] L, DO

## 2022-02-15 ENCOUNTER — Ambulatory Visit: Payer: BC Managed Care – PPO | Admitting: Internal Medicine

## 2022-02-15 NOTE — Progress Notes (Deleted)
Subjective:    Patient ID: Breanna Day, female    DOB: 1965-03-30, 56 y.o.   MRN: [id number]  HPI  Patient presents to clinic today for her annual exam.  Flu: 12/2020 Tetanus: COVID: X2 Pneumovax: 12/2020 Shingrix: Never Pap smear: 12/2017 Mammogram: 06/2019 Colon screening: Vision screening: Dentist:  Diet: Exercise:  Review of Systems     Past Medical History:  Diagnosis Date   Allergy    Chicken pox    Depression    Diabetes mellitus without complication ([place])    Hypertension     Current Outpatient Medications  Medication Sig Dispense Refill   aspirin EC 81 MG tablet Take 81 mg by mouth daily. (Patient not taking: Reported on 07/14/2021)     atorvastatin (LIPITOR) 20 MG tablet Take 1 tablet (20 mg total) by mouth daily. 90 tablet 0   Biotin 10 MG TABS Take 10 mg by mouth daily.     clobetasol cream (TEMOVATE) 0.35 % Apply 1 application topically 2 (two) times daily. On affected area 30 g 0   glipiZIDE (GLUCOTROL) 10 MG tablet Take 1 tablet (10 mg total) by mouth 2 (two) times daily before a meal. 180 tablet 0   hydrOXYzine (VISTARIL) 50 MG capsule Take 1 capsule (50 mg total) by mouth at bedtime. 90 capsule 0   lisinopril (ZESTRIL) 20 MG tablet Take 1 tablet (20 mg total) by mouth daily. 90 tablet 0   nitrofurantoin, macrocrystal-monohydrate, (MACROBID) 100 MG capsule Take 1 capsule (100 mg total) by mouth 2 (two) times daily. 10 capsule 0   OZEMPIC, 1 MG/DOSE, 4 MG/3ML SOPN Inject 1 mg into the skin once a week. 9 mL 0   pantoprazole (PROTONIX) 40 MG tablet Take 1 tablet (40 mg total) by mouth daily. 90 tablet 0   No current facility-administered medications for this visit.    Allergies  Allergen Reactions   Sulfa Antibiotics Nausea Only   Elemental Sulfur Other (See Comments)    Muscle cramps     Family History  Problem Relation Age of Onset   Hypertension Mother    Arthritis Mother    Hyperlipidemia Mother    Hypertension Father    Arthritis Father     Hyperlipidemia Father    Diabetes Maternal Grandmother    Heart disease Maternal Grandmother    Arthritis Maternal Grandmother    Hypertension Maternal Grandmother    Cancer Maternal Grandfather    Heart disease Maternal Grandfather    Arthritis Maternal Grandfather    Hypertension Maternal Grandfather    Diabetes Paternal Grandmother    Cancer Paternal Grandmother    Arthritis Paternal Grandmother    Hypertension Paternal Grandmother    Diabetes Paternal Grandfather    Heart disease Paternal Grandfather    Arthritis Paternal Grandfather    Hypertension Paternal Grandfather    Cancer Maternal Uncle        Colon   Breast cancer Paternal Aunt 61    Social History   Socioeconomic History   Marital status: Married    Spouse name: Not on file   Number of children: 2   Years of education: Not on file   Highest education level: Some college, no degree  Occupational History   Not on file  Tobacco Use   Smoking status: Never   Smokeless tobacco: Never  Substance and Sexual Activity   Alcohol use: No    Alcohol/week: 0.0 standard drinks of alcohol   Drug use: No   Sexual activity: Yes  Other Topics Concern   Not on file  Social History Narrative   Not on file   Social Determinants of Health   Financial Resource Strain: Not on file  Food Insecurity: Not on file  Transportation Needs: Not on file  Physical Activity: Not on file  Stress: Not on file  Social Connections: Not on file  Intimate Partner Violence: Not on file     Constitutional: Denies fever, malaise, fatigue, headache or abrupt weight changes.  HEENT: Denies eye pain, eye redness, ear pain, ringing in the ears, wax buildup, runny nose, nasal congestion, bloody nose, or sore throat. Respiratory: Denies difficulty breathing, shortness of breath, cough or sputum production.   Cardiovascular: Denies chest pain, chest tightness, palpitations or swelling in the hands or feet.  Gastrointestinal: Denies  abdominal pain, bloating, constipation, diarrhea or blood in the stool.  GU: Denies urgency, frequency, pain with urination, burning sensation, blood in urine, odor or discharge. Musculoskeletal: Denies decrease in range of motion, difficulty with gait, muscle pain or joint pain and swelling.  Skin: Denies redness, rashes, lesions or ulcercations.  Neurological: Denies dizziness, difficulty with memory, difficulty with speech or problems with balance and coordination.  Psych: Patient has a history of anxiety and depression.  Denies SI/HI.  No other specific complaints in a complete review of systems (except as listed in HPI above).  Objective:   Physical Exam  LMP 02/01/2016 Comment: irregular Wt Readings from Last 3 Encounters:  07/14/21 189 lb (85.7 kg)  04/12/21 201 lb (91.2 kg)  01/04/21 199 lb 12.8 oz (90.6 kg)    General: Appears their stated age, well developed, well nourished in NAD. Skin: Warm, dry and intact. No rashes, lesions or ulcerations noted. HEENT: Head: normal shape and size; Eyes: sclera white, no icterus, conjunctiva pink, PERRLA and EOMs intact; Ears: Tm's gray and intact, normal light reflex; Nose: mucosa pink and moist, septum midline; Throat/Mouth: Teeth present, mucosa pink and moist, no exudate, lesions or ulcerations noted.  Neck:  Neck supple, trachea midline. No masses, lumps or thyromegaly present.  Cardiovascular: Normal rate and rhythm. S1,S2 noted.  No murmur, rubs or gallops noted. No JVD or BLE edema. No carotid bruits noted. Pulmonary/Chest: Normal effort and positive vesicular breath sounds. No respiratory distress. No wheezes, rales or ronchi noted.  Abdomen: Soft and nontender. Normal bowel sounds. No distention or masses noted. Liver, spleen and kidneys non palpable. Musculoskeletal: Normal range of motion. No signs of joint swelling. No difficulty with gait.  Neurological: Alert and oriented. Cranial nerves II-XII grossly intact. Coordination  normal.  Psychiatric: Mood and affect normal. Behavior is normal. Judgment and thought content normal.     BMET    Component Value Date/Time   NA 140 07/14/2021 0828   K 4.5 07/14/2021 0828   CL 105 07/14/2021 0828   CO2 25 07/14/2021 0828   GLUCOSE 96 07/14/2021 0828   BUN 15 07/14/2021 0828   CREATININE 0.73 07/14/2021 0828   CALCIUM 9.4 07/14/2021 0828   GFRNONAA >60 05/15/2019 0410   GFRAA >60 05/15/2019 0410    Lipid Panel     Component Value Date/Time   CHOL 121 07/14/2021 0828   TRIG 120 07/14/2021 0828   HDL 43 (L) 07/14/2021 0828   CHOLHDL 2.8 07/14/2021 0828   VLDL 27.4 05/29/2019 1627   LDLCALC 57 07/14/2021 0828    CBC    Component Value Date/Time   WBC 13.2 (H) 07/19/2021 0813   RBC 4.88 07/19/2021 0813   HGB   13.4 07/19/2021 0813   HCT 41.6 07/19/2021 0813   PLT 393 07/19/2021 0813   MCV 85.2 07/19/2021 0813   MCH 27.5 07/19/2021 0813   MCHC 32.2 07/19/2021 0813   RDW 13.8 07/19/2021 0813   LYMPHSABS 4,079 (H) 07/19/2021 0813   EOSABS 937 (H) 07/19/2021 0813   BASOSABS 145 07/19/2021 0813    Hgb A1C Lab Results  Component Value Date   HGBA1C 7.6 (H) 07/14/2021            Assessment & Plan:   Preventative Health Maintenance:  Flu shot today Tetanus Her to get her COVID booster Pneumovax UTD Discussed Shingrix vaccine, she will check coverage with her insurance company and schedule a nurse visit if she would like to have this done Pap smear UTD Mammogram ordered-she will call to schedule Colon screening Encouraged her to consume a balanced diet and exercise regimen Advised her to see an eye doctor and dentist annually We will check CBC, c-Met, lipid, A1c  RTC in 6 months, follow-up chronic conditions Regina Baity, NP  

## 2022-02-24 ENCOUNTER — Ambulatory Visit: Payer: BC Managed Care – PPO | Admitting: Internal Medicine

## 2022-02-24 NOTE — Progress Notes (Deleted)
Subjective:    Patient ID: Breanna Day, female    DOB: 10-20-1965, 56 y.o.   MRN: [id number]  HPI  Patient presents to clinic today for annual exam.  Flu: 12/2020 Tetanus: COVID: X 2 Pneumovax: 12/2020 Shingrix: Never Pap smear: 12/2017 Mammogram: 06/2019 Colon screening: Vision screening: Dentist:  Diet: Exercise:  Review of Systems     Past Medical History:  Diagnosis Date   Allergy    Chicken pox    Depression    Diabetes mellitus without complication ([place])    Hypertension     Current Outpatient Medications  Medication Sig Dispense Refill   aspirin EC 81 MG tablet Take 81 mg by mouth daily. (Patient not taking: Reported on 07/14/2021)     atorvastatin (LIPITOR) 20 MG tablet Take 1 tablet (20 mg total) by mouth daily. 90 tablet 0   Biotin 10 MG TABS Take 10 mg by mouth daily.     clobetasol cream (TEMOVATE) 6.38 % Apply 1 application topically 2 (two) times daily. On affected area 30 g 0   glipiZIDE (GLUCOTROL) 10 MG tablet Take 1 tablet (10 mg total) by mouth 2 (two) times daily before a meal. 180 tablet 0   hydrOXYzine (VISTARIL) 50 MG capsule Take 1 capsule (50 mg total) by mouth at bedtime. 90 capsule 0   lisinopril (ZESTRIL) 20 MG tablet Take 1 tablet (20 mg total) by mouth daily. 90 tablet 0   nitrofurantoin, macrocrystal-monohydrate, (MACROBID) 100 MG capsule Take 1 capsule (100 mg total) by mouth 2 (two) times daily. 10 capsule 0   OZEMPIC, 1 MG/DOSE, 4 MG/3ML SOPN Inject 1 mg into the skin once a week. 9 mL 0   pantoprazole (PROTONIX) 40 MG tablet Take 1 tablet (40 mg total) by mouth daily. 90 tablet 0   No current facility-administered medications for this visit.    Allergies  Allergen Reactions   Sulfa Antibiotics Nausea Only   Elemental Sulfur Other (See Comments)    Muscle cramps     Family History  Problem Relation Age of Onset   Hypertension Mother    Arthritis Mother    Hyperlipidemia Mother    Hypertension Father    Arthritis Father     Hyperlipidemia Father    Diabetes Maternal Grandmother    Heart disease Maternal Grandmother    Arthritis Maternal Grandmother    Hypertension Maternal Grandmother    Cancer Maternal Grandfather    Heart disease Maternal Grandfather    Arthritis Maternal Grandfather    Hypertension Maternal Grandfather    Diabetes Paternal Grandmother    Cancer Paternal Grandmother    Arthritis Paternal Grandmother    Hypertension Paternal Grandmother    Diabetes Paternal Grandfather    Heart disease Paternal Grandfather    Arthritis Paternal Grandfather    Hypertension Paternal Grandfather    Cancer Maternal Uncle        Colon   Breast cancer Paternal Aunt 61    Social History   Socioeconomic History   Marital status: Married    Spouse name: Not on file   Number of children: 2   Years of education: Not on file   Highest education level: Some college, no degree  Occupational History   Not on file  Tobacco Use   Smoking status: Never   Smokeless tobacco: Never  Substance and Sexual Activity   Alcohol use: No    Alcohol/week: 0.0 standard drinks of alcohol   Drug use: No   Sexual activity: Yes  Other Topics Concern   Not on file  Social History Narrative   Not on file   Social Determinants of Health   Financial Resource Strain: Not on file  Food Insecurity: Not on file  Transportation Needs: Not on file  Physical Activity: Not on file  Stress: Not on file  Social Connections: Not on file  Intimate Partner Violence: Not on file     Constitutional: Denies fever, malaise, fatigue, headache or abrupt weight changes.  HEENT: Denies eye pain, eye redness, ear pain, ringing in the ears, wax buildup, runny nose, nasal congestion, bloody nose, or sore throat. Respiratory: Denies difficulty breathing, shortness of breath, cough or sputum production.   Cardiovascular: Denies chest pain, chest tightness, palpitations or swelling in the hands or feet.  Gastrointestinal: Denies  abdominal pain, bloating, constipation, diarrhea or blood in the stool.  GU: Denies urgency, frequency, pain with urination, burning sensation, blood in urine, odor or discharge. Musculoskeletal: Denies decrease in range of motion, difficulty with gait, muscle pain or joint pain and swelling.  Skin: Denies redness, rashes, lesions or ulcercations.  Neurological: Denies dizziness, difficulty with memory, difficulty with speech or problems with balance and coordination.  Psych: Patient has a history of anxiety and depression.  Denies SI/HI.  No other specific complaints in a complete review of systems (except as listed in HPI above).  Objective:   Physical Exam   LMP 02/01/2016 Comment: irregular Wt Readings from Last 3 Encounters:  07/14/21 189 lb (85.7 kg)  04/12/21 201 lb (91.2 kg)  01/04/21 199 lb 12.8 oz (90.6 kg)    General: Appears their stated age, well developed, well nourished in NAD. Skin: Warm, dry and intact. No rashes, lesions or ulcerations noted. HEENT: Head: normal shape and size; Eyes: sclera white, no icterus, conjunctiva pink, PERRLA and EOMs intact; Ears: Tm's gray and intact, normal light reflex; Nose: mucosa pink and moist, septum midline; Throat/Mouth: Teeth present, mucosa pink and moist, no exudate, lesions or ulcerations noted.  Neck:  Neck supple, trachea midline. No masses, lumps or thyromegaly present.  Cardiovascular: Normal rate and rhythm. S1,S2 noted.  No murmur, rubs or gallops noted. No JVD or BLE edema. No carotid bruits noted. Pulmonary/Chest: Normal effort and positive vesicular breath sounds. No respiratory distress. No wheezes, rales or ronchi noted.  Abdomen: Soft and nontender. Normal bowel sounds. No distention or masses noted. Liver, spleen and kidneys non palpable. Musculoskeletal: Normal range of motion. No signs of joint swelling. No difficulty with gait.  Neurological: Alert and oriented. Cranial nerves II-XII grossly intact. Coordination  normal.  Psychiatric: Mood and affect normal. Behavior is normal. Judgment and thought content normal.    BMET    Component Value Date/Time   NA 140 07/14/2021 0828   K 4.5 07/14/2021 0828   CL 105 07/14/2021 0828   CO2 25 07/14/2021 0828   GLUCOSE 96 07/14/2021 0828   BUN 15 07/14/2021 0828   CREATININE 0.73 07/14/2021 0828   CALCIUM 9.4 07/14/2021 0828   GFRNONAA >60 05/15/2019 0410   GFRAA >60 05/15/2019 0410    Lipid Panel     Component Value Date/Time   CHOL 121 07/14/2021 0828   TRIG 120 07/14/2021 0828   HDL 43 (L) 07/14/2021 0828   CHOLHDL 2.8 07/14/2021 0828   VLDL 27.4 05/29/2019 1627   LDLCALC 57 07/14/2021 0828    CBC    Component Value Date/Time   WBC 13.2 (H) 07/19/2021 0813   RBC 4.88 07/19/2021 0813   HGB  13.4 07/19/2021 0813   HCT 41.6 07/19/2021 0813   PLT 393 07/19/2021 0813   MCV 85.2 07/19/2021 0813   MCH 27.5 07/19/2021 0813   MCHC 32.2 07/19/2021 0813   RDW 13.8 07/19/2021 0813   LYMPHSABS 4,079 (H) 07/19/2021 0813   EOSABS 937 (H) 07/19/2021 0813   BASOSABS 145 07/19/2021 0813    Hgb A1C Lab Results  Component Value Date   HGBA1C 7.6 (H) 07/14/2021           Assessment & Plan:   Preventative Health Maintenance:  Flu shot today Tetanus Encouraged her to get her COVID booster Pneumovax UTD Discussed Shingrix vaccine, she will check coverage with her insurance company and schedule a nurse visit if she would like to have this done Pap smear UTD  Mammogram ordered-she will call to schedule Colon screening Encouraged her to consume a balanced diet and exercise regimen Advised her to see an eye doctor and dentist annually Will check CBC, c-Met, lipid, A1c today  RTC in 6 months, follow-up chronic conditions Webb Silversmith, NP

## 2022-03-25 ENCOUNTER — Encounter: Payer: Self-pay | Admitting: Internal Medicine

## 2022-03-25 ENCOUNTER — Ambulatory Visit: Payer: BC Managed Care – PPO | Admitting: Internal Medicine

## 2022-03-25 VITALS — BP 126/84 | HR 75 | Temp 96.9°F | Ht 62.5 in | Wt 176.0 lb

## 2022-03-25 DIAGNOSIS — Z23 Encounter for immunization: Secondary | ICD-10-CM

## 2022-03-25 DIAGNOSIS — Z1231 Encounter for screening mammogram for malignant neoplasm of breast: Secondary | ICD-10-CM | POA: Diagnosis not present

## 2022-03-25 DIAGNOSIS — E6609 Other obesity due to excess calories: Secondary | ICD-10-CM

## 2022-03-25 DIAGNOSIS — Z0001 Encounter for general adult medical examination with abnormal findings: Secondary | ICD-10-CM | POA: Diagnosis not present

## 2022-03-25 DIAGNOSIS — E1165 Type 2 diabetes mellitus with hyperglycemia: Secondary | ICD-10-CM

## 2022-03-25 DIAGNOSIS — E1169 Type 2 diabetes mellitus with other specified complication: Secondary | ICD-10-CM | POA: Diagnosis not present

## 2022-03-25 DIAGNOSIS — Z1211 Encounter for screening for malignant neoplasm of colon: Secondary | ICD-10-CM

## 2022-03-25 DIAGNOSIS — E785 Hyperlipidemia, unspecified: Secondary | ICD-10-CM

## 2022-03-25 DIAGNOSIS — K219 Gastro-esophageal reflux disease without esophagitis: Secondary | ICD-10-CM

## 2022-03-25 DIAGNOSIS — Z6831 Body mass index (BMI) 31.0-31.9, adult: Secondary | ICD-10-CM

## 2022-03-25 MED ORDER — PANTOPRAZOLE SODIUM 40 MG PO TBEC: 40.0000 mg | DELAYED_RELEASE_TABLET | Freq: Every day | ORAL | 1 refills | Status: DC

## 2022-03-25 MED ORDER — OZEMPIC (0.25 OR 0.5 MG/DOSE) 2 MG/3ML ~~LOC~~ SOPN: 0.5000 mg | PEN_INJECTOR | SUBCUTANEOUS | 1 refills | Status: DC

## 2022-03-25 MED ORDER — BUPROPION HCL ER (XL) 150 MG PO TB24: 150.0000 mg | ORAL_TABLET | Freq: Every day | ORAL | 1 refills | Status: DC

## 2022-03-25 MED ORDER — VENLAFAXINE HCL ER 150 MG PO CP24: 150.0000 mg | ORAL_CAPSULE | Freq: Every day | ORAL | 1 refills | Status: DC

## 2022-03-25 MED ORDER — HYDROXYZINE PAMOATE 50 MG PO CAPS: 50.0000 mg | ORAL_CAPSULE | Freq: Every day | ORAL | 1 refills | Status: DC

## 2022-03-25 NOTE — Assessment & Plan Note (Signed)
Encourage diet and exercise for weight loss We will restart Ozempic

## 2022-03-25 NOTE — Patient Instructions (Signed)
Health Maintenance for Postmenopausal Women Menopause is a normal process in which your ability to get pregnant comes to an end. This process happens slowly over many months or years, usually between the ages of 48 and 55. Menopause is complete when you have missed your menstrual period for 12 months. It is important to talk with your health care provider about some of the most common conditions that affect women after menopause (postmenopausal women). These include heart disease, cancer, and bone loss (osteoporosis). Adopting a healthy lifestyle and getting preventive care can help to promote your health and wellness. The actions you take can also lower your chances of developing some of these common conditions. What are the signs and symptoms of menopause? During menopause, you may have the following symptoms: Hot flashes. These can be moderate or severe. Night sweats. Decrease in sex drive. Mood swings. Headaches. Tiredness (fatigue). Irritability. Memory problems. Problems falling asleep or staying asleep. Talk with your health care provider about treatment options for your symptoms. Do I need hormone replacement therapy? Hormone replacement therapy is effective in treating symptoms that are caused by menopause, such as hot flashes and night sweats. Hormone replacement carries certain risks, especially as you become older. If you are thinking about using estrogen or estrogen with progestin, discuss the benefits and risks with your health care provider. How can I reduce my risk for heart disease and stroke? The risk of heart disease, heart attack, and stroke increases as you age. One of the causes may be a change in the body's hormones during menopause. This can affect how your body uses dietary fats, triglycerides, and cholesterol. Heart attack and stroke are medical emergencies. There are many things that you can do to help prevent heart disease and stroke. Watch your blood pressure High  blood pressure causes heart disease and increases the risk of stroke. This is more likely to develop in people who have high blood pressure readings or are overweight. Have your blood pressure checked: Every 3-5 years if you are 18-39 years of age. Every year if you are 40 years old or older. Eat a healthy diet  Eat a diet that includes plenty of vegetables, fruits, low-fat dairy products, and lean protein. Do not eat a lot of foods that are high in solid fats, added sugars, or sodium. Get regular exercise Get regular exercise. This is one of the most important things you can do for your health. Most adults should: Try to exercise for at least 150 minutes each week. The exercise should increase your heart rate and make you sweat (moderate-intensity exercise). Try to do strengthening exercises at least twice each week. Do these in addition to the moderate-intensity exercise. Spend less time sitting. Even light physical activity can be beneficial. Other tips Work with your health care provider to achieve or maintain a healthy weight. Do not use any products that contain nicotine or tobacco. These products include cigarettes, chewing tobacco, and vaping devices, such as e-cigarettes. If you need help quitting, ask your health care provider. Know your numbers. Ask your health care provider to check your cholesterol and your blood sugar (glucose). Continue to have your blood tested as directed by your health care provider. Do I need screening for cancer? Depending on your health history and family history, you may need to have cancer screenings at different stages of your life. This may include screening for: Breast cancer. Cervical cancer. Lung cancer. Colorectal cancer. What is my risk for osteoporosis? After menopause, you may be   at increased risk for osteoporosis. Osteoporosis is a condition in which bone destruction happens more quickly than new bone creation. To help prevent osteoporosis or  the bone fractures that can happen because of osteoporosis, you may take the following actions: If you are 19-50 years old, get at least 1,000 mg of calcium and at least 600 international units (IU) of vitamin D per day. If you are older than age 50 but younger than age 70, get at least 1,200 mg of calcium and at least 600 international units (IU) of vitamin D per day. If you are older than age 70, get at least 1,200 mg of calcium and at least 800 international units (IU) of vitamin D per day. Smoking and drinking excessive alcohol increase the risk of osteoporosis. Eat foods that are rich in calcium and vitamin D, and do weight-bearing exercises several times each week as directed by your health care provider. How does menopause affect my mental health? Depression may occur at any age, but it is more common as you become older. Common symptoms of depression include: Feeling depressed. Changes in sleep patterns. Changes in appetite or eating patterns. Feeling an overall lack of motivation or enjoyment of activities that you previously enjoyed. Frequent crying spells. Talk with your health care provider if you think that you are experiencing any of these symptoms. General instructions See your health care provider for regular wellness exams and vaccines. This may include: Scheduling regular health, dental, and eye exams. Getting and maintaining your vaccines. These include: Influenza vaccine. Get this vaccine each year before the flu season begins. Pneumonia vaccine. Shingles vaccine. Tetanus, diphtheria, and pertussis (Tdap) booster vaccine. Your health care provider may also recommend other immunizations. Tell your health care provider if you have ever been abused or do not feel safe at home. Summary Menopause is a normal process in which your ability to get pregnant comes to an end. This condition causes hot flashes, night sweats, decreased interest in sex, mood swings, headaches, or lack  of sleep. Treatment for this condition may include hormone replacement therapy. Take actions to keep yourself healthy, including exercising regularly, eating a healthy diet, watching your weight, and checking your blood pressure and blood sugar levels. Get screened for cancer and depression. Make sure that you are up to date with all your vaccines. This information is not intended to replace advice given to you by your health care provider. Make sure you discuss any questions you have with your health care provider. Document Revised: 07/20/2020 Document Reviewed: 07/20/2020 Elsevier Patient Education  2023 Elsevier Inc.  

## 2022-03-25 NOTE — Progress Notes (Signed)
Patient  Subjective:    Patient ID: Breanna Day, female    DOB: January 31, 1966, 57 y.o.   MRN: [id number]  HPI  Patient presents to clinic today for her annual exam.  Flu: 12/2020 Tetanus: unsure COVID: X 2 Pneumovax: 12/2020 Shingrix: Never Pap Smear: 12/2017 Mammogram: 06/2019 Colon screening: never Vision screening: annually Dentist: biannually  Diet: She does eat some meat. She consumes fruits and veggies. She tries to avoid fried foods. She drinks mostly water. Exercise: None  Review of Systems     Past Medical History:  Diagnosis Date   Allergy    Chicken pox    Depression    Diabetes mellitus without complication ([facility])    Hypertension     Current Outpatient Medications  Medication Sig Dispense Refill   aspirin EC 81 MG tablet Take 81 mg by mouth daily. (Patient not taking: Reported on 07/14/2021)     atorvastatin (LIPITOR) 20 MG tablet Take 1 tablet (20 mg total) by mouth daily. 90 tablet 0   Biotin 10 MG TABS Take 10 mg by mouth daily.     clobetasol cream (TEMOVATE) 0.05 % Apply 1 application topically 2 (two) times daily. On affected area 30 g 0   glipiZIDE (GLUCOTROL) 10 MG tablet Take 1 tablet (10 mg total) by mouth 2 (two) times daily before a meal. 180 tablet 0   hydrOXYzine (VISTARIL) 50 MG capsule Take 1 capsule (50 mg total) by mouth at bedtime. 90 capsule 0   lisinopril (ZESTRIL) 20 MG tablet Take 1 tablet (20 mg total) by mouth daily. 90 tablet 0   nitrofurantoin, macrocrystal-monohydrate, (MACROBID) 100 MG capsule Take 1 capsule (100 mg total) by mouth 2 (two) times daily. 10 capsule 0   OZEMPIC, 1 MG/DOSE, 4 MG/3ML SOPN Inject 1 mg into the skin once a week. 9 mL 0   pantoprazole (PROTONIX) 40 MG tablet Take 1 tablet (40 mg total) by mouth daily. 90 tablet 0   No current facility-administered medications for this visit.    Allergies  Allergen Reactions   Sulfa Antibiotics Nausea Only   Elemental Sulfur Other (See Comments)    Muscle cramps      Family History  Problem Relation Age of Onset   Hypertension Mother    Arthritis Mother    Hyperlipidemia Mother    Hypertension Father    Arthritis Father    Hyperlipidemia Father    Diabetes Maternal Grandmother    Heart disease Maternal Grandmother    Arthritis Maternal Grandmother    Hypertension Maternal Grandmother    Cancer Maternal Grandfather    Heart disease Maternal Grandfather    Arthritis Maternal Grandfather    Hypertension Maternal Grandfather    Diabetes Paternal Grandmother    Cancer Paternal Grandmother    Arthritis Paternal Grandmother    Hypertension Paternal Grandmother    Diabetes Paternal Grandfather    Heart disease Paternal Grandfather    Arthritis Paternal Grandfather    Hypertension Paternal Grandfather    Cancer Maternal Uncle        Colon   Breast cancer Paternal Aunt 88    Social History   Socioeconomic History   Marital status: Married    Spouse name: Not on file   Number of children: 2   Years of education: Not on file   Highest education level: Some college, no degree  Occupational History   Not on file  Tobacco Use   Smoking status: Never   Smokeless tobacco: Never  Substance and  Sexual Activity   Alcohol use: No    Alcohol/week: 0.0 standard drinks of alcohol   Drug use: No   Sexual activity: Yes  Other Topics Concern   Not on file  Social History Narrative   Not on file   Social Determinants of Health   Financial Resource Strain: Not on file  Food Insecurity: Not on file  Transportation Needs: Not on file  Physical Activity: Not on file  Stress: Not on file  Social Connections: Not on file  Intimate Partner Violence: Not on file     Constitutional: Denies fever, malaise, fatigue, headache or abrupt weight changes.  HEENT: Denies eye pain, eye redness, ear pain, ringing in the ears, wax buildup, runny nose, nasal congestion, bloody nose, or sore throat. Respiratory: Denies difficulty breathing, shortness of  breath, cough or sputum production.   Cardiovascular: Denies chest pain, chest tightness, palpitations or swelling in the hands or feet.  Gastrointestinal: Denies abdominal pain, bloating, constipation, diarrhea or blood in the stool.  GU: Denies urgency, frequency, pain with urination, burning sensation, blood in urine, odor or discharge. Musculoskeletal: Denies decrease in range of motion, difficulty with gait, muscle pain or joint pain and swelling.  Skin: Denies redness, rashes, lesions or ulcercations.  Neurological: Patient reports hot flashes.  Denies dizziness, difficulty with memory, difficulty with speech or problems with balance and coordination.  Psych: Patient has a history of anxiety and depression.  Denies SI/HI.  No other specific complaints in a complete review of systems (except as listed in HPI above).  Objective:   Physical Exam   BP 126/84 (BP Location: Left Arm, Patient Position: Sitting, Cuff Size: Normal)   Pulse 75   Temp (!) 96.9 F (36.1 C) (Temporal)   Ht 5' 2.5" (1.588 m)   Wt 176 lb (79.8 kg)   LMP 02/01/2016 Comment: irregular  SpO2 97%   BMI 31.68 kg/m   Wt Readings from Last 3 Encounters:  07/14/21 189 lb (85.7 kg)  04/12/21 201 lb (91.2 kg)  01/04/21 199 lb 12.8 oz (90.6 kg)    General: Appears her stated age, obese, in NAD. Skin: Warm, dry and intact. No ulcerations noted. HEENT: Head: normal shape and size; Eyes: sclera white, no icterus, conjunctiva pink, PERRLA and EOMs intact;  Neck:  Neck supple, trachea midline. No masses, lumps or thyromegaly present.  Cardiovascular: Normal rate and rhythm. S1,S2 noted.  No murmur, rubs or gallops noted. No JVD or BLE edema. No carotid bruits noted. Pulmonary/Chest: Normal effort and positive vesicular breath sounds. No respiratory distress. No wheezes, rales or ronchi noted.  Abdomen: Normal bowel sounds.  Musculoskeletal: Strength 5/5 BUE/BLE. No difficulty with gait.  Neurological: Alert and  oriented. Cranial nerves II-XII grossly intact. Coordination normal.  Psychiatric: Mood and affect normal. Behavior is normal. Judgment and thought content normal.    BMET    Component Value Date/Time   NA 140 07/14/2021 0828   K 4.5 07/14/2021 0828   CL 105 07/14/2021 0828   CO2 25 07/14/2021 0828   GLUCOSE 96 07/14/2021 0828   BUN 15 07/14/2021 0828   CREATININE 0.73 07/14/2021 0828   CALCIUM 9.4 07/14/2021 0828   GFRNONAA >60 05/15/2019 0410   GFRAA >60 05/15/2019 0410    Lipid Panel     Component Value Date/Time   CHOL 121 07/14/2021 0828   TRIG 120 07/14/2021 0828   HDL 43 (L) 07/14/2021 0828   CHOLHDL 2.8 07/14/2021 0828   VLDL 27.4 05/29/2019 1627  LDLCALC 57 07/14/2021 0828    CBC    Component Value Date/Time   WBC 13.2 (H) 07/19/2021 0813   RBC 4.88 07/19/2021 0813   HGB 13.4 07/19/2021 0813   HCT 41.6 07/19/2021 0813   PLT 393 07/19/2021 0813   MCV 85.2 07/19/2021 0813   MCH 27.5 07/19/2021 0813   MCHC 32.2 07/19/2021 0813   RDW 13.8 07/19/2021 0813   LYMPHSABS 4,079 (H) 07/19/2021 0813   EOSABS 937 (H) 07/19/2021 0813   BASOSABS 145 07/19/2021 0813    Hgb A1C Lab Results  Component Value Date   HGBA1C 7.6 (H) 07/14/2021           Assessment & Plan:   Preventative Health Maintenance:  Flu shot today She declines tetanus booster today Encouraged her to get her COVID booster Pneumovax UTD Discussed Shingrix vaccine, she will check coverage with her insurance company and schedule a nurse visit if she would like to have this done We will do her Pap smear at her next annual exam Mammogram ordered-she will call to schedule Cologuard ordered Encouraged her to consume a balanced diet and exercise regimen Advised her seeing eye doctor and dentist annually We will check CBC, c-Met, lipid and A1c today  RTC in 6 months, follow-up chronic conditions Webb Silversmith, NP

## 2022-03-26 LAB — CBC
HCT: 41.6 % (ref 35.0–45.0)
Hemoglobin: 14 g/dL (ref 11.7–15.5)
MCH: 28.6 pg (ref 27.0–33.0)
MCHC: 33.7 g/dL (ref 32.0–36.0)
MCV: 85.1 fL (ref 80.0–100.0)
MPV: 10.4 fL (ref 7.5–12.5)
Platelets: 409 10*3/uL — ABNORMAL HIGH (ref 140–400)
RBC: 4.89 10*6/uL (ref 3.80–5.10)
RDW: 12.8 % (ref 11.0–15.0)
WBC: 8.9 10*3/uL (ref 3.8–10.8)

## 2022-03-26 LAB — COMPLETE METABOLIC PANEL WITH GFR
AG Ratio: 1.6 (calc) (ref 1.0–2.5)
ALT: 14 U/L (ref 6–29)
AST: 12 U/L (ref 10–35)
Albumin: 4.2 g/dL (ref 3.6–5.1)
Alkaline phosphatase (APISO): 101 U/L (ref 37–153)
BUN: 15 mg/dL (ref 7–25)
CO2: 25 mmol/L (ref 20–32)
Calcium: 9.3 mg/dL (ref 8.6–10.4)
Chloride: 105 mmol/L (ref 98–110)
Creat: 0.71 mg/dL (ref 0.50–1.03)
Globulin: 2.7 g/dL (calc) (ref 1.9–3.7)
Glucose, Bld: 158 mg/dL — ABNORMAL HIGH (ref 65–99)
Potassium: 4.9 mmol/L (ref 3.5–5.3)
Sodium: 139 mmol/L (ref 135–146)
Total Bilirubin: 0.4 mg/dL (ref 0.2–1.2)
Total Protein: 6.9 g/dL (ref 6.1–8.1)
eGFR: 100 mL/min/{1.73_m2} (ref >=60)

## 2022-03-26 LAB — HEMOGLOBIN A1C
Hgb A1c MFr Bld: 7.7 % of total Hgb — ABNORMAL HIGH (ref <5.7)
Mean Plasma Glucose: 174 mg/dL
eAG (mmol/L): 9.7 mmol/L

## 2022-03-26 LAB — LIPID PANEL
Cholesterol: 211 mg/dL — ABNORMAL HIGH (ref <200)
HDL: 52 mg/dL (ref >=50)
LDL Cholesterol (Calc): 136 mg/dL (calc) — ABNORMAL HIGH
Non-HDL Cholesterol (Calc): 159 mg/dL (calc) — ABNORMAL HIGH (ref <130)
Total CHOL/HDL Ratio: 4.1 (calc) (ref <5.0)
Triglycerides: 115 mg/dL (ref <150)

## 2022-03-29 ENCOUNTER — Encounter: Payer: Self-pay | Admitting: Internal Medicine

## 2022-03-29 DIAGNOSIS — E1169 Type 2 diabetes mellitus with other specified complication: Secondary | ICD-10-CM

## 2022-03-30 MED ORDER — ATORVASTATIN CALCIUM 40 MG PO TABS: 40.0000 mg | ORAL_TABLET | Freq: Every day | ORAL | 1 refills | Status: DC

## 2022-04-08 ENCOUNTER — Ambulatory Visit
Admission: RE | Admit: 2022-04-08 | Discharge: 2022-04-08 | Disposition: A | Discharge status: Home / Self Care | Payer: BC Managed Care – PPO | Source: Ambulatory Visit | Attending: Internal Medicine | Admitting: Internal Medicine

## 2022-04-08 DIAGNOSIS — Z1231 Encounter for screening mammogram for malignant neoplasm of breast: Secondary | ICD-10-CM | POA: Insufficient documentation

## 2022-08-26 ENCOUNTER — Other Ambulatory Visit: Payer: Self-pay | Admitting: Internal Medicine

## 2022-08-26 NOTE — Telephone Encounter (Signed)
Requested Prescriptions  Pending Prescriptions Disp Refills   glipiZIDE (GLUCOTROL) 10 MG tablet [Pharmacy Med Name: GLIPIZIDE 10 MG TABLET] 180 tablet 0    Sig: Take 1 tablet (10 mg total) by mouth 2 (two) times daily before a meal.     Endocrinology:  Diabetes - Sulfonylureas Passed - 08/26/2022 11:13 AM      Passed - HBA1C is between 0 and 7.9 and within 180 days    Hgb A1c MFr Bld  Date Value Ref Range Status  03/25/2022 7.7 (H) <5.7 % of total Hgb Final    Comment:    For someone without known diabetes, a hemoglobin A1c value of 6.5% or greater indicates that they may have  diabetes and this should be confirmed with a follow-up  test. . For someone with known diabetes, a value <7% indicates  that their diabetes is well controlled and a value  greater than or equal to 7% indicates suboptimal  control. A1c targets should be individualized based on  duration of diabetes, age, comorbid conditions, and  other considerations. . Currently, no consensus exists regarding use of hemoglobin A1c for diagnosis of diabetes for children. .          Passed - Cr in normal range and within 360 days    Creat  Date Value Ref Range Status  03/25/2022 0.71 0.50 - 1.03 mg/dL Final   Creatinine, Urine  Date Value Ref Range Status  07/14/2021 340 (H) 20 - 275 mg/dL Final         Passed - Valid encounter within last 6 months    Recent Outpatient Visits           5 months ago Encounter for general adult medical examination with abnormal findings   [facility] [place], Salvadore Oxford, NP   1 year ago Anxiety and depression   [place] [place], [place] W, NP   1 year ago Type 2 diabetes mellitus with hyperglycemia, without long-term current use of insulin [facility])   [facility] [facility] [place], Salvadore Oxford, NP   1 year ago Encounter for general adult medical examination with abnormal findings   [place] [facility] [place], [place] W, NP   1 year ago Type 2 diabetes mellitus with hyperglycemia, without long-term current use of insulin [facility])   [facility] [place], Salvadore Oxford, NP       Future Appointments             In 4 weeks [place], Salvadore Oxford, NP [facility], [facility]

## 2022-09-18 ENCOUNTER — Other Ambulatory Visit: Payer: Self-pay | Admitting: Internal Medicine

## 2022-09-19 NOTE — Telephone Encounter (Signed)
Requested Prescriptions  Pending Prescriptions Disp Refills   buPROPion (WELLBUTRIN XL) 150 MG 24 hr tablet [Pharmacy Med Name: BUPROPION HCL XL 150 MG TABLET] 90 tablet 0    Sig: Take 1 tablet (150 mg total) by mouth daily.     Psychiatry: Antidepressants - bupropion Passed - 09/18/2022  9:45 AM      Passed - Cr in normal range and within 360 days    Creat  Date Value Ref Range Status  03/25/2022 0.71 0.50 - 1.03 mg/dL Final   Creatinine, Urine  Date Value Ref Range Status  07/14/2021 340 (H) 20 - 275 mg/dL Final         Passed - AST in normal range and within 360 days    AST  Date Value Ref Range Status  03/25/2022 12 10 - 35 U/L Final         Passed - ALT in normal range and within 360 days    ALT  Date Value Ref Range Status  03/25/2022 14 6 - 29 U/L Final         Passed - Completed PHQ-2 or PHQ-9 in the last 360 days      Passed - Last BP in normal range    BP Readings from Last 1 Encounters:  03/25/22 126/84         Passed - Valid encounter within last 6 months    Recent Outpatient Visits           5 months ago Encounter for general adult medical examination with abnormal findings   [facility] [place], Salvadore Oxford, NP   1 year ago Anxiety and depression   [place] [facility] [place], [place] W, NP   1 year ago Type 2 diabetes mellitus with hyperglycemia, without long-term current use of insulin [facility])   [facility] [place], Salvadore Oxford, NP   1 year ago Encounter for general adult medical examination with abnormal findings   [place] [facility] [place], [place] W, NP   1 year ago Type 2 diabetes mellitus with hyperglycemia, without long-term current use of insulin [facility])   [facility] [place], Salvadore Oxford, NP       Future Appointments             In 4 days [place], Salvadore Oxford, NP [place] [facility], [facility]

## 2022-09-23 ENCOUNTER — Encounter: Payer: Self-pay | Admitting: Internal Medicine

## 2022-09-23 ENCOUNTER — Ambulatory Visit: Payer: BC Managed Care – PPO | Admitting: Internal Medicine

## 2022-09-23 VITALS — BP 118/70 | HR 94 | Temp 98.2°F | Resp 17 | Ht 62.5 in | Wt 183.0 lb

## 2022-09-23 DIAGNOSIS — E785 Hyperlipidemia, unspecified: Secondary | ICD-10-CM

## 2022-09-23 DIAGNOSIS — G4733 Obstructive sleep apnea (adult) (pediatric): Secondary | ICD-10-CM | POA: Diagnosis not present

## 2022-09-23 DIAGNOSIS — D75839 Thrombocytosis, unspecified: Secondary | ICD-10-CM

## 2022-09-23 DIAGNOSIS — E1165 Type 2 diabetes mellitus with hyperglycemia: Secondary | ICD-10-CM

## 2022-09-23 DIAGNOSIS — E1169 Type 2 diabetes mellitus with other specified complication: Secondary | ICD-10-CM

## 2022-09-23 DIAGNOSIS — Z6832 Body mass index (BMI) 32.0-32.9, adult: Secondary | ICD-10-CM

## 2022-09-23 DIAGNOSIS — I10 Essential (primary) hypertension: Secondary | ICD-10-CM | POA: Diagnosis not present

## 2022-09-23 DIAGNOSIS — F32A Depression, unspecified: Secondary | ICD-10-CM

## 2022-09-23 DIAGNOSIS — L501 Idiopathic urticaria: Secondary | ICD-10-CM

## 2022-09-23 DIAGNOSIS — K219 Gastro-esophageal reflux disease without esophagitis: Secondary | ICD-10-CM

## 2022-09-23 DIAGNOSIS — F419 Anxiety disorder, unspecified: Secondary | ICD-10-CM

## 2022-09-23 DIAGNOSIS — E6609 Other obesity due to excess calories: Secondary | ICD-10-CM

## 2022-09-23 LAB — POCT GLYCOSYLATED HEMOGLOBIN (HGB A1C): Hemoglobin A1C: 5.7 % — AB (ref 4.0–5.6)

## 2022-09-23 LAB — CBC
HCT: 40.7 % (ref 35.0–45.0)
MCH: 29.3 pg (ref 27.0–33.0)
MCHC: 32.9 g/dL (ref 32.0–36.0)

## 2022-09-23 MED ORDER — PANTOPRAZOLE SODIUM 20 MG PO TBEC: 20.0000 mg | DELAYED_RELEASE_TABLET | Freq: Every day | ORAL | 1 refills | Status: DC

## 2022-09-23 NOTE — Assessment & Plan Note (Signed)
Encourage diet and exercise for weight loss 

## 2022-09-23 NOTE — Assessment & Plan Note (Signed)
Stable on venlafaxine, bupropion and hydroxyzine Support offered

## 2022-09-23 NOTE — Progress Notes (Signed)
Subjective:    Patient ID: Breanna Day, female    DOB: 10-Jan-1966, 57 y.o.   MRN: [id number]  HPI  Patient presents to clinic today for 44-month follow-up of chronic conditions.  HTN: Her BP today is 118/70.  She no longer takes lisinopril.  There is no ECG on file.  HLD: Her last LDL was 136, triglycerides 115, 03/2022.  She denies myalgias on atorvastatin.  She tries to consume a low-fat diet.  DM 2: Her last A1c was 7.7%, 03/2022.  She is taking glipizide and ozempic as prescribed.  She does not check her sugars.  She checks her feet routinely.  Her last eye exam was > 1 year ago.  Flu 03/2022.  Pneumovax 12/2020.  COVID x 2.  GERD: She is not sure what triggers this.  She denies breakthrough on pantoprazole.  There is no upper GI on file.  OSA: She averages 7 hours of sleep per night without the use of her CPAP.  Sleep study from 11/2020 reviewed.  Anxiety and depression: Chronic, managed on venlafaxine, bupropion and hydroxyzine as needed.  She is not currently seeing a therapist.  She denies SI/HI.  Idiopathic urticaria: Managed with hydroxyzine as needed.  She follows with allergist.  Thrombocytosis: Her last platelet count was 409, 03/2022.  She does not follow with hematology.  Review of Systems  Past Medical History:  Diagnosis Date   Allergy    Chicken pox    Depression    Diabetes mellitus without complication (HCC)    Hypertension     Current Outpatient Medications  Medication Sig Dispense Refill   aspirin EC 81 MG tablet Take 81 mg by mouth daily.     atorvastatin (LIPITOR) 40 MG tablet Take 1 tablet (40 mg total) by mouth daily. 90 tablet 1   Biotin 10 MG TABS Take 10 mg by mouth daily.     buPROPion (WELLBUTRIN XL) 150 MG 24 hr tablet Take 1 tablet (150 mg total) by mouth daily. 90 tablet 0   clobetasol cream (TEMOVATE) 0.05 % Apply 1 application topically 2 (two) times daily. On affected area 30 g 0   glipiZIDE (GLUCOTROL) 10 MG tablet Take 1 tablet (10 mg  total) by mouth 2 (two) times daily before a meal. 180 tablet 0   hydrOXYzine (VISTARIL) 50 MG capsule Take 1 capsule (50 mg total) by mouth at bedtime. 90 capsule 1   lisinopril (ZESTRIL) 20 MG tablet Take 1 tablet (20 mg total) by mouth daily. 90 tablet 0   OZEMPIC, 0.25 OR 0.5 MG/DOSE, 2 MG/3ML SOPN Inject 0.5 mg into the skin once a week. Start with 0.25mg  weekly x 4 weeks then increase to 0.5mg  weekly injection. 9 mL 1   pantoprazole (PROTONIX) 40 MG tablet Take 1 tablet (40 mg total) by mouth daily. 90 tablet 1   venlafaxine XR (EFFEXOR XR) 150 MG 24 hr capsule Take 1 capsule (150 mg total) by mouth daily with breakfast. 90 capsule 1   No current facility-administered medications for this visit.    Allergies  Allergen Reactions   Sulfa Antibiotics Nausea Only   Elemental Sulfur Other (See Comments)    Muscle cramps     Family History  Problem Relation Age of Onset   Hypertension Mother    Arthritis Mother    Hyperlipidemia Mother    Atrial fibrillation Mother    Stroke Mother    Hypertension Father    Arthritis Father    Hyperlipidemia Father  Diabetes Maternal Grandmother    Heart disease Maternal Grandmother    Arthritis Maternal Grandmother    Hypertension Maternal Grandmother    Cancer Maternal Grandfather    Heart disease Maternal Grandfather    Arthritis Maternal Grandfather    Hypertension Maternal Grandfather    Diabetes Paternal Grandmother    Cancer Paternal Grandmother    Arthritis Paternal Grandmother    Hypertension Paternal Grandmother    Diabetes Paternal Grandfather    Heart disease Paternal Grandfather    Arthritis Paternal Grandfather    Hypertension Paternal Grandfather    Colon cancer Maternal Uncle    Breast cancer Paternal Aunt 37    Social History   Socioeconomic History   Marital status: Married    Spouse name: Not on file   Number of children: 2   Years of education: Not on file   Highest education level: Some college, no degree   Occupational History   Not on file  Tobacco Use   Smoking status: Never   Smokeless tobacco: Never  Substance and Sexual Activity   Alcohol use: No    Alcohol/week: 0.0 standard drinks of alcohol   Drug use: No   Sexual activity: Yes  Other Topics Concern   Not on file  Social History Narrative   Not on file   Social Determinants of Health   Financial Resource Strain: Not on file  Food Insecurity: Not on file  Transportation Needs: Not on file  Physical Activity: Not on file  Stress: Not on file  Social Connections: Not on file  Intimate Partner Violence: Not on file     Constitutional: Denies fever, malaise, fatigue, headache or abrupt weight changes.  HEENT: Denies eye pain, eye redness, ear pain, ringing in the ears, wax buildup, runny nose, nasal congestion, bloody nose, or sore throat. Respiratory: Denies difficulty breathing, shortness of breath, cough or sputum production.   Cardiovascular: Denies chest pain, chest tightness, palpitations or swelling in the hands or feet.  Gastrointestinal: Denies abdominal pain, bloating, constipation, diarrhea or blood in the stool.  GU: Denies urgency, frequency, pain with urination, burning sensation, blood in urine, odor or discharge. Musculoskeletal: Pt reports chronic back pain. Denies decrease in range of motion, difficulty with gait, muscle pain or joint swelling.  Skin: Patient reports intermittent hives.  Denies redness, rashes, lesions or ulcercations.  Neurological: Pt reports paresthesia of left leg. Denies dizziness, difficulty with memory, difficulty with speech or problems with balance and coordination.  Psych: Patient has a history of anxiety and depression.  Denies SI/HI.  No other specific complaints in a complete review of systems (except as listed in HPI above).     Objective:   Physical Exam  BP 118/70 (BP Location: Left Arm, Patient Position: Sitting, Cuff Size: Large)   Pulse 94   Temp 98.2 F (36.8 C)  (Oral)   Resp 17   Ht 5' 2.5" (1.588 m)   Wt 183 lb (83 kg)   LMP 02/01/2016 Comment: irregular  SpO2 99%   BMI 32.94 kg/m   Wt Readings from Last 3 Encounters:  03/25/22 176 lb (79.8 kg)  07/14/21 189 lb (85.7 kg)  04/12/21 201 lb (91.2 kg)    General: Appears her stated age, obese,in NAD. Skin: Warm, dry and intact. No ulcerations noted. HEENT: Head: normal shape and size; Eyes: sclera white, no icterus, conjunctiva pink, PERRLA and EOMs intact;  Cardiovascular: Normal rate and rhythm. S1,S2 noted.  No murmur, rubs or gallops noted. No JVD or  BLE edema. No carotid bruits noted. Pulmonary/Chest: Normal effort and positive vesicular breath sounds. No respiratory distress. No wheezes, rales or ronchi noted.  Abdomen: Soft and nontender. Normal bowel sounds.  Musculoskeletal: No difficulty with gait.  Neurological: Alert and oriented. Coordination normal.  Psychiatric: Mood and affect normal. Behavior is normal. Judgment and thought content normal.   BMET    Component Value Date/Time   NA 139 03/25/2022 0828   K 4.9 03/25/2022 0828   CL 105 03/25/2022 0828   CO2 25 03/25/2022 0828   GLUCOSE 158 (H) 03/25/2022 0828   BUN 15 03/25/2022 0828   CREATININE 0.71 03/25/2022 0828   CALCIUM 9.3 03/25/2022 0828   GFRNONAA >60 05/15/2019 0410   GFRAA >60 05/15/2019 0410    Lipid Panel     Component Value Date/Time   CHOL 211 (H) 03/25/2022 0828   TRIG 115 03/25/2022 0828   HDL 52 03/25/2022 0828   CHOLHDL 4.1 03/25/2022 0828   VLDL 27.4 05/29/2019 1627   LDLCALC 136 (H) 03/25/2022 0828    CBC    Component Value Date/Time   WBC 8.9 03/25/2022 0828   RBC 4.89 03/25/2022 0828   HGB 14.0 03/25/2022 0828   HCT 41.6 03/25/2022 0828   PLT 409 (H) 03/25/2022 0828   MCV 85.1 03/25/2022 0828   MCH 28.6 03/25/2022 0828   MCHC 33.7 03/25/2022 0828   RDW 12.8 03/25/2022 0828   LYMPHSABS 4,079 (H) 07/19/2021 0813   EOSABS 937 (H) 07/19/2021 0813   BASOSABS 145 07/19/2021 0813     Hgb A1C Lab Results  Component Value Date   HGBA1C 7.7 (H) 03/25/2022           Assessment & Plan:     RTC in 6 months, for your annual exam Nicki Reaper, NP

## 2022-09-23 NOTE — Patient Instructions (Signed)

## 2022-09-23 NOTE — Assessment & Plan Note (Signed)
Continue hydroxyzine.

## 2022-09-23 NOTE — Assessment & Plan Note (Signed)
Encourage weight loss as this can help reduce reflux symptoms Will decrease pantoprazole to 20 mg daily

## 2022-09-23 NOTE — Assessment & Plan Note (Signed)
POCT A1c 5.7% We will check urine microalbumin Encouraged her to consume a low-carb diet and exercise for weight loss Continue glipizide and Ozempic Encouraged her to get a dilated eye exam Encouraged routine foot exam Encouraged her to get a flu shot in the fall Pneumovax UTD Encouraged her to get her COVID booster

## 2022-09-23 NOTE — Assessment & Plan Note (Signed)
C-Met and lipid profile today Encouraged her to consume a low-fat diet Continue atorvastatin 

## 2022-09-23 NOTE — Assessment & Plan Note (Signed)
CBC today.  

## 2022-09-23 NOTE — Assessment & Plan Note (Signed)
Controlled off meds Reinforced DASH diet and exercise for weight loss C-Met today 

## 2022-09-23 NOTE — Assessment & Plan Note (Signed)
Encourage weight loss as this can help reduce sleep apnea symptoms She does not wear her CPAP machine

## 2022-09-24 LAB — LIPID PANEL
Cholesterol: 116 mg/dL (ref <200)
HDL: 47 mg/dL — ABNORMAL LOW (ref >=50)
LDL Cholesterol (Calc): 44 mg/dL (calc)
Non-HDL Cholesterol (Calc): 69 mg/dL (calc) (ref <130)
Total CHOL/HDL Ratio: 2.5 (calc) (ref <5.0)
Triglycerides: 168 mg/dL — ABNORMAL HIGH (ref <150)

## 2022-09-24 LAB — COMPLETE METABOLIC PANEL WITH GFR
AG Ratio: 1.5 (calc) (ref 1.0–2.5)
ALT: 14 U/L (ref 6–29)
AST: 15 U/L (ref 10–35)
Albumin: 4.1 g/dL (ref 3.6–5.1)
Alkaline phosphatase (APISO): 89 U/L (ref 37–153)
BUN: 17 mg/dL (ref 7–25)
CO2: 24 mmol/L (ref 20–32)
Calcium: 9.4 mg/dL (ref 8.6–10.4)
Chloride: 106 mmol/L (ref 98–110)
Creat: 0.77 mg/dL (ref 0.50–1.03)
Globulin: 2.7 g/dL (calc) (ref 1.9–3.7)
Glucose, Bld: 88 mg/dL (ref 65–99)
Potassium: 4.7 mmol/L (ref 3.5–5.3)
Sodium: 140 mmol/L (ref 135–146)
Total Bilirubin: 0.3 mg/dL (ref 0.2–1.2)
Total Protein: 6.8 g/dL (ref 6.1–8.1)
eGFR: 90 mL/min/{1.73_m2} (ref >=60)

## 2022-09-24 LAB — CBC
Hemoglobin: 13.4 g/dL (ref 11.7–15.5)
MCV: 89.1 fL (ref 80.0–100.0)
MPV: 10.1 fL (ref 7.5–12.5)
Platelets: 384 10*3/uL (ref 140–400)
RBC: 4.57 10*6/uL (ref 3.80–5.10)
RDW: 12.7 % (ref 11.0–15.0)
WBC: 11.2 10*3/uL — ABNORMAL HIGH (ref 3.8–10.8)

## 2022-09-24 LAB — MICROALBUMIN / CREATININE URINE RATIO
Creatinine, Urine: 122 mg/dL (ref 20–275)
Microalb Creat Ratio: 7 mg/g creat (ref <30)
Microalb, Ur: 0.9 mg/dL

## 2022-09-29 ENCOUNTER — Other Ambulatory Visit: Payer: Self-pay | Admitting: Internal Medicine

## 2022-09-30 NOTE — Telephone Encounter (Signed)
Requested medications are due for refill today. unsure  Requested medications are on the active medications list.  yes  Last refill. 03/25/2022 9mL 1 rf  Future visit scheduled.   yes  Notes to clinic.  Please review sig for possible update.    Requested Prescriptions  Pending Prescriptions Disp Refills   OZEMPIC, 0.25 OR 0.5 MG/DOSE, 2 MG/3ML SOPN [Pharmacy Med Name: OZEMPIC 0.25-0.5 MG/DOSE PEN] 9 mL 0    Sig: Inject 0.5 mg into the skin once a week. Start with 0.25mg  weekly x 4 weeks then increase to 0.5mg  weekly injection.     Endocrinology:  Diabetes - GLP-1 Receptor Agonists - semaglutide Failed - 09/29/2022 11:35 AM      Failed - HBA1C in normal range and within 180 days    Hemoglobin A1C  Date Value Ref Range Status  09/23/2022 5.7 (A) 4.0 - 5.6 % Final   Hgb A1c MFr Bld  Date Value Ref Range Status  03/25/2022 7.7 (H) <5.7 % of total Hgb Final    Comment:    For someone without known diabetes, a hemoglobin A1c value of 6.5% or greater indicates that they may have  diabetes and this should be confirmed with a follow-up  test. . For someone with known diabetes, a value <7% indicates  that their diabetes is well controlled and a value  greater than or equal to 7% indicates suboptimal  control. A1c targets should be individualized based on  duration of diabetes, age, comorbid conditions, and  other considerations. . Currently, no consensus exists regarding use of hemoglobin A1c for diagnosis of diabetes for children. .          Passed - Cr in normal range and within 360 days    Creat  Date Value Ref Range Status  09/23/2022 0.77 0.50 - 1.03 mg/dL Final   Creatinine, Urine  Date Value Ref Range Status  09/23/2022 122 20 - 275 mg/dL Final         Passed - Valid encounter within last 6 months    Recent Outpatient Visits           1 week ago Type 2 diabetes mellitus with hyperglycemia, without long-term current use of insulin [facility])   [facility] [place], Salvadore Oxford, NP   6 months ago Encounter for general adult medical examination with abnormal findings   [place] [facility] [place], Salvadore Oxford, NP   1 year ago Anxiety and depression   [place] [facility] [place], [place] W, NP   1 year ago Type 2 diabetes mellitus with hyperglycemia, without long-term current use of insulin [facility])   [place] [facility] [place], Salvadore Oxford, NP   1 year ago Encounter for general adult medical examination with abnormal findings   [place] [facility] [place], Salvadore Oxford, NP       Future Appointments             In 5 months Baity, Salvadore Oxford, NP [facility], [facility]

## 2022-10-04 ENCOUNTER — Other Ambulatory Visit: Payer: Self-pay | Admitting: Internal Medicine

## 2022-10-05 NOTE — Telephone Encounter (Signed)
Requested Prescriptions  Pending Prescriptions Disp Refills   hydrOXYzine (VISTARIL) 50 MG capsule [Pharmacy Med Name: HYDROXYZINE PAM 50 MG CAP] 90 capsule 2    Sig: Take 1 capsule (50 mg total) by mouth at bedtime.     Ear, Nose, and Throat:  Antihistamines 2 Passed - 10/04/2022 10:26 AM      Passed - Cr in normal range and within 360 days    Creat  Date Value Ref Range Status  09/23/2022 0.77 0.50 - 1.03 mg/dL Final   Creatinine, Urine  Date Value Ref Range Status  09/23/2022 122 20 - 275 mg/dL Final         Passed - Valid encounter within last 12 months    Recent Outpatient Visits           1 week ago Type 2 diabetes mellitus with hyperglycemia, without long-term current use of insulin [facility])   [place] [facility] [place], Salvadore Oxford, NP   6 months ago Encounter for general adult medical examination with abnormal findings   [place] [facility] [place], Salvadore Oxford, NP   1 year ago Anxiety and depression   [place] [facility] [place], [place] W, NP   1 year ago Type 2 diabetes mellitus with hyperglycemia, without long-term current use of insulin [facility])   [place] [facility] [place], Salvadore Oxford, NP   1 year ago Encounter for general adult medical examination with abnormal findings   [place] [facility] [place], Salvadore Oxford, NP       Future Appointments             In 5 months Baity, Salvadore Oxford, NP  [facility], [facility]

## 2022-11-21 ENCOUNTER — Other Ambulatory Visit: Payer: Self-pay | Admitting: Internal Medicine

## 2022-11-22 NOTE — Telephone Encounter (Signed)
Requested Prescriptions  Pending Prescriptions Disp Refills   glipiZIDE (GLUCOTROL) 10 MG tablet [Pharmacy Med Name: GLIPIZIDE 10 MG TABLET] 180 tablet 0    Sig: Take 1 tablet (10 mg total) by mouth 2 (two) times daily before a meal.     Endocrinology:  Diabetes - Sulfonylureas Passed - 11/21/2022 11:20 AM      Passed - HBA1C is between 0 and 7.9 and within 180 days    Hemoglobin A1C  Date Value Ref Range Status  09/23/2022 5.7 (A) 4.0 - 5.6 % Final   Hgb A1c MFr Bld  Date Value Ref Range Status  03/25/2022 7.7 (H) <5.7 % of total Hgb Final    Comment:    For someone without known diabetes, a hemoglobin A1c value of 6.5% or greater indicates that they may have  diabetes and this should be confirmed with a follow-up  test. . For someone with known diabetes, a value <7% indicates  that their diabetes is well controlled and a value  greater than or equal to 7% indicates suboptimal  control. A1c targets should be individualized based on  duration of diabetes, age, comorbid conditions, and  other considerations. . Currently, no consensus exists regarding use of hemoglobin A1c for diagnosis of diabetes for children. .          Passed - Cr in normal range and within 360 days    Creat  Date Value Ref Range Status  09/23/2022 0.77 0.50 - 1.03 mg/dL Final   Creatinine, Urine  Date Value Ref Range Status  09/23/2022 122 20 - 275 mg/dL Final         Passed - Valid encounter within last 6 months    Recent Outpatient Visits           2 months ago Type 2 diabetes mellitus with hyperglycemia, without long-term current use of insulin [facility])   [place] [place], Salvadore Oxford, NP   8 months ago Encounter for general adult medical examination with abnormal findings   [place] [facility] [place], Salvadore Oxford, NP   1 year ago Anxiety and depression   [place] [facility] [place], [place], NP   1 year ago Type 2 diabetes mellitus  with hyperglycemia, without long-term current use of insulin [facility])   [place] [facility] [place], Salvadore Oxford, NP   1 year ago Encounter for general adult medical examination with abnormal findings   [place] [facility] [place], Salvadore Oxford, NP       Future Appointments             In 4 months [place], Salvadore Oxford, NP [facility]             atorvastatin (LIPITOR) 40 MG tablet [Pharmacy Med Name: ATORVASTATIN 40 MG TABLET] 90 tablet 0    Sig: Take 1 tablet (40 mg total) by mouth daily.     Cardiovascular:  Antilipid - Statins Failed - 11/21/2022 11:20 AM      Failed - Lipid Panel in normal range within the last 12 months    Cholesterol  Date Value Ref Range Status  09/23/2022 116 <200 mg/dL Final   LDL Cholesterol (Calc)  Date Value Ref Range Status  09/23/2022 44 mg/dL (calc) Final    Comment:    Reference range: <100 . Desirable range <100 mg/dL for primary prevention;   <70 mg/dL for patients with  CHD or diabetic patients  with > or = 2 CHD risk factors. Marland Kitchen LDL-C is now calculated using the Martin-Hopkins  calculation, which is a validated novel method providing  better accuracy than the Friedewald equation in the  estimation of LDL-C.  Horald Pollen et al. Lenox Ahr. 9518;841(66): 2061-2068  (http://education.[employer].com/faq/FAQ164)    Direct LDL  Date Value Ref Range Status  01/15/2019 150.0 mg/dL Final    Comment:    Optimal:  <100 mg/dLNear or Above Optimal:  100-129 mg/dLBorderline High:  130-159 mg/dLHigh:  160-189 mg/dLVery High:  >190 mg/dL   HDL  Date Value Ref Range Status  09/23/2022 47 (L) > OR = 50 mg/dL Final   Triglycerides  Date Value Ref Range Status  09/23/2022 168 (H) <150 mg/dL Final         Passed - Patient is not pregnant      Passed - Valid encounter within last 12 months    Recent Outpatient Visits           2 months ago Type 2 diabetes mellitus with hyperglycemia,  without long-term current use of insulin [facility])   [place] [facility] [place], Salvadore Oxford, NP   8 months ago Encounter for general adult medical examination with abnormal findings   [place] [facility] [place], Salvadore Oxford, NP   1 year ago Anxiety and depression   [facility] [place], [place], NP   1 year ago Type 2 diabetes mellitus with hyperglycemia, without long-term current use of insulin [facility])   [place] [facility] [place], Salvadore Oxford, NP   1 year ago Encounter for general adult medical examination with abnormal findings   [place] [facility] [place], Salvadore Oxford, NP       Future Appointments             In 4 months Baity, Salvadore Oxford, NP [place] [facility], [facility]

## 2022-11-25 ENCOUNTER — Other Ambulatory Visit: Payer: Self-pay | Admitting: Internal Medicine

## 2022-11-25 ENCOUNTER — Encounter: Payer: Self-pay | Admitting: Internal Medicine

## 2022-11-25 MED ORDER — OZEMPIC (0.25 OR 0.5 MG/DOSE) 2 MG/3ML ~~LOC~~ SOPN: 0.5000 mg | PEN_INJECTOR | SUBCUTANEOUS | 0 refills | Status: DC

## 2022-11-25 NOTE — Telephone Encounter (Signed)
PA has been submitted to Cover My Meds.   Thanks,   -Vernona Rieger

## 2022-12-27 ENCOUNTER — Other Ambulatory Visit: Payer: Self-pay | Admitting: Internal Medicine

## 2022-12-27 NOTE — Telephone Encounter (Signed)
Requested Prescriptions  Pending Prescriptions Disp Refills   buPROPion (WELLBUTRIN XL) 150 MG 24 hr tablet [Pharmacy Med Name: BUPROPION HCL XL 150 MG TABLET] 90 tablet 0    Sig: Take 1 tablet (150 mg total) by mouth daily.     Psychiatry: Antidepressants - bupropion Passed - 12/27/2022  9:51 AM      Passed - Cr in normal range and within 360 days    Creat  Date Value Ref Range Status  09/23/2022 0.77 0.50 - 1.03 mg/dL Final   Creatinine, Urine  Date Value Ref Range Status  09/23/2022 122 20 - 275 mg/dL Final         Passed - AST in normal range and within 360 days    AST  Date Value Ref Range Status  09/23/2022 15 10 - 35 U/L Final         Passed - ALT in normal range and within 360 days    ALT  Date Value Ref Range Status  09/23/2022 14 6 - 29 U/L Final         Passed - Completed PHQ-2 or PHQ-9 in the last 360 days      Passed - Last BP in normal range    BP Readings from Last 1 Encounters:  09/23/22 118/70         Passed - Valid encounter within last 6 months    Recent Outpatient Visits           3 months ago Type 2 diabetes mellitus with hyperglycemia, without long-term current use of insulin [facility])   [place] [facility] [place], Salvadore Oxford, NP   9 months ago Encounter for general adult medical examination with abnormal findings   [place] [facility] [place], Salvadore Oxford, NP   1 year ago Anxiety and depression   [facility] [place], [place], NP   1 year ago Type 2 diabetes mellitus with hyperglycemia, without long-term current use of insulin [facility])   [place] [facility] [place], Salvadore Oxford, NP   1 year ago Encounter for general adult medical examination with abnormal findings   [place] [facility] [place], Salvadore Oxford, NP       Future Appointments             In 3 months Baity, Salvadore Oxford, NP [facility], [facility]

## 2023-03-16 ENCOUNTER — Other Ambulatory Visit: Payer: Self-pay | Admitting: Internal Medicine

## 2023-03-16 DIAGNOSIS — E1165 Type 2 diabetes mellitus with hyperglycemia: Secondary | ICD-10-CM

## 2023-03-20 NOTE — Telephone Encounter (Signed)
 Requested Prescriptions  Pending Prescriptions Disp Refills   pantoprazole  (PROTONIX ) 20 MG tablet [Pharmacy Med Name: PANTOPRAZOLE  SOD DR 20 MG TAB] 90 tablet 0    Sig: Take 1 tablet (20 mg total) by mouth daily.     Gastroenterology: Proton Pump Inhibitors Passed - 03/20/2023 10:43 AM      Passed - Valid encounter within last 12 months    Recent Outpatient Visits           5 months ago Type 2 diabetes mellitus with hyperglycemia, without long-term current use of insulin  [facility])   [facility] [facility] [place], Angeline ORN, NP   12 months ago Encounter for general adult medical examination with abnormal findings   [place] [facility] [place], Angeline ORN, NP   1 year ago Anxiety and depression   [place] [facility] [place], [place] W, NP   1 year ago Type 2 diabetes mellitus with hyperglycemia, without long-term current use of insulin  [facility])   [place] [facility] [place], Angeline ORN, NP   2 years ago Encounter for general adult medical examination with abnormal findings   [place] [facility] [place], Angeline ORN, NP       Future Appointments             In 1 week [place], Angeline ORN, NP [place] [facility]

## 2023-03-26 ENCOUNTER — Other Ambulatory Visit: Payer: Self-pay | Admitting: Internal Medicine

## 2023-03-27 NOTE — Telephone Encounter (Signed)
 Requested medication (s) are due for refill today: yes  Requested medication (s) are on the active medication list: yes  Last refill:  11/25/22 #9/0  Future visit scheduled: yes tomorrow CPE  Notes to clinic:  Unable to refill per protocol due to failed labs, no updated results.      Requested Prescriptions  Pending Prescriptions Disp Refills   OZEMPIC , 0.25 OR 0.5 MG/DOSE, 2 MG/3ML SOPN [Pharmacy Med Name: OZEMPIC  0.25-0.5 MG/DOSE PEN] 9 mL 0    Sig: Inject 0.5 mg into the skin once a week. Start with 0.25mg  weekly x 4 weeks then increase to 0.5mg  weekly injection.     Endocrinology:  Diabetes - GLP-1 Receptor Agonists - semaglutide  Failed - 03/27/2023 10:58 AM      Failed - HBA1C in normal range and within 180 days    Hemoglobin A1C  Date Value Ref Range Status  09/23/2022 5.7 (A) 4.0 - 5.6 % Final   Hgb A1c MFr Bld  Date Value Ref Range Status  03/25/2022 7.7 (H) <5.7 % of total Hgb Final    Comment:    For someone without known diabetes, a hemoglobin A1c value of 6.5% or greater indicates that they may have  diabetes and this should be confirmed with a follow-up  test. . For someone with known diabetes, a value <7% indicates  that their diabetes is well controlled and a value  greater than or equal to 7% indicates suboptimal  control. A1c targets should be individualized based on  duration of diabetes, age, comorbid conditions, and  other considerations. . Currently, no consensus exists regarding use of hemoglobin A1c for diagnosis of diabetes for children. .          Failed - Valid encounter within last 6 months    Recent Outpatient Visits           6 months ago Type 2 diabetes mellitus with hyperglycemia, without long-term current use of insulin  [facility])   [place] [facility] [place], Angeline ORN, NP   1 year ago Encounter for general adult medical examination with abnormal findings   [facility] [facility] [place], Angeline ORN, NP   1  year ago Anxiety and depression   [facility] [place], [place] W, NP   1 year ago Type 2 diabetes mellitus with hyperglycemia, without long-term current use of insulin  [facility])   [place] [facility] [place], Angeline ORN, NP   2 years ago Encounter for general adult medical examination with abnormal findings   [place] [facility] [place], Angeline ORN, NP       Future Appointments             Tomorrow Antonette, Angeline ORN, NP [place] [facility]            Passed - Cr in normal range and within 360 days    Creat  Date Value Ref Range Status  09/23/2022 0.77 0.50 - 1.03 mg/dL Final   Creatinine, Urine  Date Value Ref Range Status  09/23/2022 122 20 - 275 mg/dL Final

## 2023-03-28 ENCOUNTER — Encounter: Payer: 59 | Admitting: Internal Medicine

## 2023-03-28 NOTE — Progress Notes (Deleted)
 Patient  Subjective:    Patient ID: Breanna Day, female    DOB: 06/19/1965, 58 y.o.   MRN: [id number]  HPI  Patient presents to clinic today for her annual exam.  Flu: 03/2022 Tetanus: unsure COVID: X 2 Pneumovax: 12/2020 Shingrix: Never Pap Smear: 12/2017 Mammogram: 03/2022 Colon screening: never Vision screening: annually Dentist: biannually  Diet: She does eat some meat. She consumes fruits and veggies. She tries to avoid fried foods. She drinks mostly water. Exercise: None  Review of Systems     Past Medical History:  Diagnosis Date   Allergy    Chicken pox    Depression    Diabetes mellitus without complication (HCC)    Hypertension     Current Outpatient Medications  Medication Sig Dispense Refill   aspirin  EC 81 MG tablet Take 81 mg by mouth daily. (Patient not taking: Reported on 09/23/2022)     atorvastatin  (LIPITOR) 40 MG tablet Take 1 tablet (40 mg total) by mouth daily. 90 tablet 2   buPROPion  (WELLBUTRIN  XL) 150 MG 24 hr tablet Take 1 tablet (150 mg total) by mouth daily. 90 tablet 0   clobetasol  cream (TEMOVATE ) 0.05 % Apply 1 application topically 2 (two) times daily. On affected area 30 g 0   glipiZIDE  (GLUCOTROL ) 10 MG tablet Take 1 tablet (10 mg total) by mouth 2 (two) times daily before a meal. 180 tablet 1   hydrOXYzine  (VISTARIL ) 50 MG capsule Take 1 capsule (50 mg total) by mouth at bedtime. 90 capsule 2   OZEMPIC , 0.25 OR 0.5 MG/DOSE, 2 MG/3ML SOPN Inject 0.5 mg into the skin once a week. Start with 0.25mg  weekly x 4 weeks then increase to 0.5mg  weekly injection. 9 mL 0   pantoprazole  (PROTONIX ) 20 MG tablet Take 1 tablet (20 mg total) by mouth daily. 90 tablet 0   venlafaxine  XR (EFFEXOR  XR) 150 MG 24 hr capsule Take 1 capsule (150 mg total) by mouth daily with breakfast. 90 capsule 1   No current facility-administered medications for this visit.    Allergies  Allergen Reactions   Sulfa  Antibiotics Nausea Only   Elemental Sulfur Other (See  Comments)    Muscle cramps     Family History  Problem Relation Age of Onset   Hypertension Mother    Arthritis Mother    Hyperlipidemia Mother    Atrial fibrillation Mother    Stroke Mother    Hypertension Father    Arthritis Father    Hyperlipidemia Father    Diabetes Maternal Grandmother    Heart disease Maternal Grandmother    Arthritis Maternal Grandmother    Hypertension Maternal Grandmother    Cancer Maternal Grandfather    Heart disease Maternal Grandfather    Arthritis Maternal Grandfather    Hypertension Maternal Grandfather    Diabetes Paternal Grandmother    Cancer Paternal Grandmother    Arthritis Paternal Grandmother    Hypertension Paternal Grandmother    Diabetes Paternal Grandfather    Heart disease Paternal Grandfather    Arthritis Paternal Grandfather    Hypertension Paternal Grandfather    Colon cancer Maternal Uncle    Breast cancer Paternal Aunt 66    Social History   Socioeconomic History   Marital status: Married    Spouse name: Not on file   Number of children: 2   Years of education: Not on file   Highest education level: Some college, no degree  Occupational History   Not on file  Tobacco Use  Smoking status: Never   Smokeless tobacco: Never  Substance and Sexual Activity   Alcohol use: No    Alcohol/week: 0.0 standard drinks of alcohol   Drug use: No   Sexual activity: Yes  Other Topics Concern   Not on file  Social History Narrative   Not on file   Social Drivers of Health   Financial Resource Strain: Not on file  Food Insecurity: Not on file  Transportation Needs: Not on file  Physical Activity: Not on file  Stress: Not on file  Social Connections: Not on file  Intimate Partner Violence: Not on file     Constitutional: Denies fever, malaise, fatigue, headache or abrupt weight changes.  HEENT: Denies eye pain, eye redness, ear pain, ringing in the ears, wax buildup, runny nose, nasal congestion, bloody nose, or sore  throat. Respiratory: Denies difficulty breathing, shortness of breath, cough or sputum production.   Cardiovascular: Denies chest pain, chest tightness, palpitations or swelling in the hands or feet.  Gastrointestinal: Denies abdominal pain, bloating, constipation, diarrhea or blood in the stool.  GU: Denies urgency, frequency, pain with urination, burning sensation, blood in urine, odor or discharge. Musculoskeletal: Denies decrease in range of motion, difficulty with gait, muscle pain or joint pain and swelling.  Skin: Denies redness, rashes, lesions or ulcercations.  Neurological: Patient reports hot flashes.  Denies dizziness, difficulty with memory, difficulty with speech or problems with balance and coordination.  Psych: Patient has a history of anxiety and depression.  Denies SI/HI.  No other specific complaints in a complete review of systems (except as listed in HPI above).  Objective:   Physical Exam   LMP 02/01/2016 Comment: irregular  Wt Readings from Last 3 Encounters:  09/23/22 183 lb (83 kg)  03/25/22 176 lb (79.8 kg)  07/14/21 189 lb (85.7 kg)    General: Appears her stated age, obese, in NAD. Skin: Warm, dry and intact. No ulcerations noted. HEENT: Head: normal shape and size; Eyes: sclera white, no icterus, conjunctiva pink, PERRLA and EOMs intact;  Neck:  Neck supple, trachea midline. No masses, lumps or thyromegaly present.  Cardiovascular: Normal rate and rhythm. S1,S2 noted.  No murmur, rubs or gallops noted. No JVD or BLE edema. No carotid bruits noted. Pulmonary/Chest: Normal effort and positive vesicular breath sounds. No respiratory distress. No wheezes, rales or ronchi noted.  Abdomen: Normal bowel sounds.  Musculoskeletal: Strength 5/5 BUE/BLE. No difficulty with gait.  Neurological: Alert and oriented. Cranial nerves II-XII grossly intact. Coordination normal.  Psychiatric: Mood and affect normal. Behavior is normal. Judgment and thought content normal.     BMET    Component Value Date/Time   NA 140 09/23/2022 0830   K 4.7 09/23/2022 0830   CL 106 09/23/2022 0830   CO2 24 09/23/2022 0830   GLUCOSE 88 09/23/2022 0830   BUN 17 09/23/2022 0830   CREATININE 0.77 09/23/2022 0830   CALCIUM  9.4 09/23/2022 0830   GFRNONAA >60 05/15/2019 0410   GFRAA >60 05/15/2019 0410    Lipid Panel     Component Value Date/Time   CHOL 116 09/23/2022 0830   TRIG 168 (H) 09/23/2022 0830   HDL 47 (L) 09/23/2022 0830   CHOLHDL 2.5 09/23/2022 0830   VLDL 27.4 05/29/2019 1627   LDLCALC 44 09/23/2022 0830    CBC    Component Value Date/Time   WBC 11.2 (H) 09/23/2022 0830   RBC 4.57 09/23/2022 0830   HGB 13.4 09/23/2022 0830   HCT 40.7 09/23/2022 0830  PLT 384 09/23/2022 0830   MCV 89.1 09/23/2022 0830   MCH 29.3 09/23/2022 0830   MCHC 32.9 09/23/2022 0830   RDW 12.7 09/23/2022 0830   LYMPHSABS 4,079 (H) 07/19/2021 0813   EOSABS 937 (H) 07/19/2021 0813   BASOSABS 145 07/19/2021 0813    Hgb A1C Lab Results  Component Value Date   HGBA1C 5.7 (A) 09/23/2022           Assessment & Plan:   Preventative Health Maintenance:  Flu shot today She declines tetanus booster today Encouraged her to get her COVID booster Pneumovax UTD Discussed Shingrix vaccine, she will check coverage with her insurance company and schedule a nurse visit if she would like to have this done Pap smear Mammogram ordered-she will call to schedule Cologuard ordered Encouraged her to consume a balanced diet and exercise regimen Advised her seeing eye doctor and dentist annually We will check CBC, c-Met, lipid and A1c today  RTC in 6 months, follow-up chronic conditions Angeline Laura, NP

## 2023-04-03 ENCOUNTER — Encounter: Payer: Self-pay | Admitting: Internal Medicine

## 2023-04-03 ENCOUNTER — Ambulatory Visit (INDEPENDENT_AMBULATORY_CARE_PROVIDER_SITE_OTHER): Payer: 59 | Admitting: Internal Medicine

## 2023-04-03 ENCOUNTER — Other Ambulatory Visit (HOSPITAL_COMMUNITY)
Admission: RE | Admit: 2023-04-03 | Discharge: 2023-04-03 | Disposition: A | Discharge status: Home / Self Care | Payer: 59 | Source: Ambulatory Visit | Attending: Internal Medicine | Admitting: Internal Medicine

## 2023-04-03 VITALS — BP 118/82 | Ht 62.5 in | Wt 188.2 lb

## 2023-04-03 DIAGNOSIS — Z1211 Encounter for screening for malignant neoplasm of colon: Secondary | ICD-10-CM | POA: Diagnosis not present

## 2023-04-03 DIAGNOSIS — Z124 Encounter for screening for malignant neoplasm of cervix: Secondary | ICD-10-CM | POA: Diagnosis present

## 2023-04-03 DIAGNOSIS — Z0001 Encounter for general adult medical examination with abnormal findings: Secondary | ICD-10-CM

## 2023-04-03 DIAGNOSIS — Z1231 Encounter for screening mammogram for malignant neoplasm of breast: Secondary | ICD-10-CM | POA: Diagnosis not present

## 2023-04-03 DIAGNOSIS — E1165 Type 2 diabetes mellitus with hyperglycemia: Secondary | ICD-10-CM

## 2023-04-03 DIAGNOSIS — Z23 Encounter for immunization: Secondary | ICD-10-CM

## 2023-04-03 LAB — POCT GLYCOSYLATED HEMOGLOBIN (HGB A1C): Hemoglobin A1C: 5.6 % (ref 4.0–5.6)

## 2023-04-03 NOTE — Patient Instructions (Signed)
Health Maintenance for Postmenopausal Women Menopause is a normal process in which your ability to get pregnant comes to an end. This process happens slowly over many months or years, usually between the ages of 48 and 55. Menopause is complete when you have missed your menstrual period for 12 months. It is important to talk with your health care provider about some of the most common conditions that affect women after menopause (postmenopausal women). These include heart disease, cancer, and bone loss (osteoporosis). Adopting a healthy lifestyle and getting preventive care can help to promote your health and wellness. The actions you take can also lower your chances of developing some of these common conditions. What are the signs and symptoms of menopause? During menopause, you may have the following symptoms: Hot flashes. These can be moderate or severe. Night sweats. Decrease in sex drive. Mood swings. Headaches. Tiredness (fatigue). Irritability. Memory problems. Problems falling asleep or staying asleep. Talk with your health care provider about treatment options for your symptoms. Do I need hormone replacement therapy? Hormone replacement therapy is effective in treating symptoms that are caused by menopause, such as hot flashes and night sweats. Hormone replacement carries certain risks, especially as you become older. If you are thinking about using estrogen or estrogen with progestin, discuss the benefits and risks with your health care provider. How can I reduce my risk for heart disease and stroke? The risk of heart disease, heart attack, and stroke increases as you age. One of the causes may be a change in the body's hormones during menopause. This can affect how your body uses dietary fats, triglycerides, and cholesterol. Heart attack and stroke are medical emergencies. There are many things that you can do to help prevent heart disease and stroke. Watch your blood pressure High  blood pressure causes heart disease and increases the risk of stroke. This is more likely to develop in people who have high blood pressure readings or are overweight. Have your blood pressure checked: Every 3-5 years if you are 18-39 years of age. Every year if you are 40 years old or older. Eat a healthy diet  Eat a diet that includes plenty of vegetables, fruits, low-fat dairy products, and lean protein. Do not eat a lot of foods that are high in solid fats, added sugars, or sodium. Get regular exercise Get regular exercise. This is one of the most important things you can do for your health. Most adults should: Try to exercise for at least 150 minutes each week. The exercise should increase your heart rate and make you sweat (moderate-intensity exercise). Try to do strengthening exercises at least twice each week. Do these in addition to the moderate-intensity exercise. Spend less time sitting. Even light physical activity can be beneficial. Other tips Work with your health care provider to achieve or maintain a healthy weight. Do not use any products that contain nicotine or tobacco. These products include cigarettes, chewing tobacco, and vaping devices, such as e-cigarettes. If you need help quitting, ask your health care provider. Know your numbers. Ask your health care provider to check your cholesterol and your blood sugar (glucose). Continue to have your blood tested as directed by your health care provider. Do I need screening for cancer? Depending on your health history and family history, you may need to have cancer screenings at different stages of your life. This may include screening for: Breast cancer. Cervical cancer. Lung cancer. Colorectal cancer. What is my risk for osteoporosis? After menopause, you may be   at increased risk for osteoporosis. Osteoporosis is a condition in which bone destruction happens more quickly than new bone creation. To help prevent osteoporosis or  the bone fractures that can happen because of osteoporosis, you may take the following actions: If you are 19-50 years old, get at least 1,000 mg of calcium and at least 600 international units (IU) of vitamin D per day. If you are older than age 50 but younger than age 70, get at least 1,200 mg of calcium and at least 600 international units (IU) of vitamin D per day. If you are older than age 70, get at least 1,200 mg of calcium and at least 800 international units (IU) of vitamin D per day. Smoking and drinking excessive alcohol increase the risk of osteoporosis. Eat foods that are rich in calcium and vitamin D, and do weight-bearing exercises several times each week as directed by your health care provider. How does menopause affect my mental health? Depression may occur at any age, but it is more common as you become older. Common symptoms of depression include: Feeling depressed. Changes in sleep patterns. Changes in appetite or eating patterns. Feeling an overall lack of motivation or enjoyment of activities that you previously enjoyed. Frequent crying spells. Talk with your health care provider if you think that you are experiencing any of these symptoms. General instructions See your health care provider for regular wellness exams and vaccines. This may include: Scheduling regular health, dental, and eye exams. Getting and maintaining your vaccines. These include: Influenza vaccine. Get this vaccine each year before the flu season begins. Pneumonia vaccine. Shingles vaccine. Tetanus, diphtheria, and pertussis (Tdap) booster vaccine. Your health care provider may also recommend other immunizations. Tell your health care provider if you have ever been abused or do not feel safe at home. Summary Menopause is a normal process in which your ability to get pregnant comes to an end. This condition causes hot flashes, night sweats, decreased interest in sex, mood swings, headaches, or lack  of sleep. Treatment for this condition may include hormone replacement therapy. Take actions to keep yourself healthy, including exercising regularly, eating a healthy diet, watching your weight, and checking your blood pressure and blood sugar levels. Get screened for cancer and depression. Make sure that you are up to date with all your vaccines. This information is not intended to replace advice given to you by your health care provider. Make sure you discuss any questions you have with your health care provider. Document Revised: 07/20/2020 Document Reviewed: 07/20/2020 [employer] Patient Education  2024 [employer]  

## 2023-04-03 NOTE — Progress Notes (Signed)
Patient  Subjective:    Patient ID: Breanna Day, female    DOB: [birth year], 58 y.o.   MRN: [id number]  HPI  Patient presents to clinic today for her annual exam.  Flu: 03/2022 Tetanus: unsure COVID: X 2 Pneumovax: 12/2020 Shingrix: Never Pap Smear: 12/2017 Mammogram: 03/2022 Colon screening: never Vision screening: annually Dentist: biannually  Diet: She does eat some meat. She consumes fruits and veggies. She tries to avoid fried foods. She drinks mostly water. Exercise: None  Review of Systems     Past Medical History:  Diagnosis Date   Allergy    Chicken pox    Depression    Diabetes mellitus without complication (HCC)    Hypertension     Current Outpatient Medications  Medication Sig Dispense Refill   aspirin EC 81 MG tablet Take 81 mg by mouth daily. (Patient not taking: Reported on 09/23/2022)     atorvastatin (LIPITOR) 40 MG tablet Take 1 tablet (40 mg total) by mouth daily. 90 tablet 2   buPROPion (WELLBUTRIN XL) 150 MG 24 hr tablet Take 1 tablet (150 mg total) by mouth daily. 90 tablet 0   clobetasol cream (TEMOVATE) 0.05 % Apply 1 application topically 2 (two) times daily. On affected area 30 g 0   glipiZIDE (GLUCOTROL) 10 MG tablet Take 1 tablet (10 mg total) by mouth 2 (two) times daily before a meal. 180 tablet 1   hydrOXYzine (VISTARIL) 50 MG capsule Take 1 capsule (50 mg total) by mouth at bedtime. 90 capsule 2   OZEMPIC, 0.25 OR 0.5 MG/DOSE, 2 MG/3ML SOPN Inject 0.5 mg into the skin once a week. Start with 0.25mg  weekly x 4 weeks then increase to 0.5mg  weekly injection. 9 mL 0   pantoprazole (PROTONIX) 20 MG tablet Take 1 tablet (20 mg total) by mouth daily. 90 tablet 0   venlafaxine XR (EFFEXOR XR) 150 MG 24 hr capsule Take 1 capsule (150 mg total) by mouth daily with breakfast. 90 capsule 1   No current facility-administered medications for this visit.    Allergies  Allergen Reactions   Sulfa Antibiotics Nausea Only   Elemental Sulfur Other (See  Comments)    Muscle cramps     Family History  Problem Relation Age of Onset   Hypertension Mother    Arthritis Mother    Hyperlipidemia Mother    Atrial fibrillation Mother    Stroke Mother    Hypertension Father    Arthritis Father    Hyperlipidemia Father    Diabetes Maternal Grandmother    Heart disease Maternal Grandmother    Arthritis Maternal Grandmother    Hypertension Maternal Grandmother    Cancer Maternal Grandfather    Heart disease Maternal Grandfather    Arthritis Maternal Grandfather    Hypertension Maternal Grandfather    Diabetes Paternal Grandmother    Cancer Paternal Grandmother    Arthritis Paternal Grandmother    Hypertension Paternal Grandmother    Diabetes Paternal Grandfather    Heart disease Paternal Grandfather    Arthritis Paternal Grandfather    Hypertension Paternal Grandfather    Colon cancer Maternal Uncle    Breast cancer Paternal Aunt 4    Social History   Socioeconomic History   Marital status: Married    Spouse name: Not on file   Number of children: 2   Years of education: Not on file   Highest education level: Some college, no degree  Occupational History   Not on file  Tobacco Use  Smoking status: Never   Smokeless tobacco: Never  Substance and Sexual Activity   Alcohol use: No    Alcohol/week: 0.0 standard drinks of alcohol   Drug use: No   Sexual activity: Yes  Other Topics Concern   Not on file  Social History Narrative   Not on file   Social Drivers of Health   Financial Resource Strain: Not on file  Food Insecurity: Not on file  Transportation Needs: Not on file  Physical Activity: Not on file  Stress: Not on file  Social Connections: Not on file  Intimate Partner Violence: Not on file     Constitutional: Denies fever, malaise, fatigue, headache or abrupt weight changes.  HEENT: Denies eye pain, eye redness, ear pain, ringing in the ears, wax buildup, runny nose, nasal congestion, bloody nose, or sore  throat. Respiratory: Denies difficulty breathing, shortness of breath, cough or sputum production.   Cardiovascular: Denies chest pain, chest tightness, palpitations or swelling in the hands or feet.  Gastrointestinal: Denies abdominal pain, bloating, constipation, diarrhea or blood in the stool.  GU: Denies urgency, frequency, pain with urination, burning sensation, blood in urine, odor or discharge. Musculoskeletal: Denies decrease in range of motion, difficulty with gait, muscle pain or joint pain and swelling.  Skin: Denies redness, rashes, lesions or ulcercations.  Neurological: Patient reports hot flashes.  Denies dizziness, difficulty with memory, difficulty with speech or problems with balance and coordination.  Psych: Patient has a history of anxiety and depression.  Denies SI/HI.  No other specific complaints in a complete review of systems (except as listed in HPI above).  Objective:   Physical Exam   BP 118/82 (BP Location: Left Arm, Patient Position: Sitting, Cuff Size: Large)   Ht 5' 2.5" (1.588 m)   Wt 188 lb 3.2 oz (85.4 kg)   LMP 02/01/2016 Comment: irregular  BMI 33.87 kg/m    Wt Readings from Last 3 Encounters:  09/23/22 183 lb (83 kg)  03/25/22 176 lb (79.8 kg)  07/14/21 189 lb (85.7 kg)    General: Appears her stated age, obese, in NAD. Skin: Warm, dry and intact. No ulcerations noted. HEENT: Head: normal shape and size; Eyes: sclera white, no icterus, conjunctiva pink, PERRLA and EOMs intact;  Neck:  Neck supple, trachea midline. No masses, lumps or thyromegaly present.  Cardiovascular: Normal rate and rhythm. S1,S2 noted.  No murmur, rubs or gallops noted. No JVD or BLE edema. No carotid bruits noted. Pulmonary/Chest: Normal effort and positive vesicular breath sounds. No respiratory distress. No wheezes, rales or ronchi noted.  Abdomen: Normal bowel sounds.  Pelvic: Normal female anatomy.  Cervix not well-visualized.  Friable vaginal wall tissue.  No CMT.   Adnexa nonpalpable. Musculoskeletal: Strength 5/5 BUE/BLE. No difficulty with gait.  Neurological: Alert and oriented. Cranial nerves II-XII grossly intact. Coordination normal.  Psychiatric: Mood and affect normal. Behavior is normal. Judgment and thought content normal.    BMET    Component Value Date/Time   NA 140 09/23/2022 0830   K 4.7 09/23/2022 0830   CL 106 09/23/2022 0830   CO2 24 09/23/2022 0830   GLUCOSE 88 09/23/2022 0830   BUN 17 09/23/2022 0830   CREATININE 0.77 09/23/2022 0830   CALCIUM 9.4 09/23/2022 0830   GFRNONAA >60 05/15/2019 0410   GFRAA >60 05/15/2019 0410    Lipid Panel     Component Value Date/Time   CHOL 116 09/23/2022 0830   TRIG 168 (H) 09/23/2022 0830   HDL 47 (L) 09/23/2022  0830   CHOLHDL 2.5 09/23/2022 0830   VLDL 27.4 05/29/2019 1627   LDLCALC 44 09/23/2022 0830    CBC    Component Value Date/Time   WBC 11.2 (H) 09/23/2022 0830   RBC 4.57 09/23/2022 0830   HGB 13.4 09/23/2022 0830   HCT 40.7 09/23/2022 0830   PLT 384 09/23/2022 0830   MCV 89.1 09/23/2022 0830   MCH 29.3 09/23/2022 0830   MCHC 32.9 09/23/2022 0830   RDW 12.7 09/23/2022 0830   LYMPHSABS 4,079 (H) 07/19/2021 0813   EOSABS 937 (H) 07/19/2021 0813   BASOSABS 145 07/19/2021 0813    Hgb A1C Lab Results  Component Value Date   HGBA1C 5.7 (A) 09/23/2022           Assessment & Plan:   Preventative Health Maintenance:  Flu shot today She declines tetanus booster today Encouraged her to get her COVID booster Pneumovax UTD Discussed Shingrix vaccine, she will check coverage with her insurance company and schedule a nurse visit if she would like to have this done Pap smear today-she declines STD screening Mammogram ordered-she will call to schedule Cologuard ordered Encouraged her to consume a balanced diet and exercise regimen Advised her seeing eye doctor and dentist annually We will check CBC, c-Met, lipid and A1c today  RTC in 6 months, follow-up  chronic conditions Nicki Reaper, NP

## 2023-04-04 ENCOUNTER — Encounter: Payer: Self-pay | Admitting: Internal Medicine

## 2023-04-04 LAB — CBC
HCT: 40.1 % (ref 35.0–45.0)
Hemoglobin: 13.4 g/dL (ref 11.7–15.5)
MCH: 29.8 pg (ref 27.0–33.0)
MCHC: 33.4 g/dL (ref 32.0–36.0)
MCV: 89.1 fL (ref 80.0–100.0)
MPV: 10 fL (ref 7.5–12.5)
Platelets: 366 10*3/uL (ref 140–400)
RBC: 4.5 10*6/uL (ref 3.80–5.10)
RDW: 12.1 % (ref 11.0–15.0)
WBC: 10.8 10*3/uL (ref 3.8–10.8)

## 2023-04-04 LAB — COMPLETE METABOLIC PANEL WITH GFR
AG Ratio: 1.7 (calc) (ref 1.0–2.5)
ALT: 10 U/L (ref 6–29)
AST: 11 U/L (ref 10–35)
Albumin: 4.2 g/dL (ref 3.6–5.1)
Alkaline phosphatase (APISO): 79 U/L (ref 37–153)
BUN: 16 mg/dL (ref 7–25)
CO2: 28 mmol/L (ref 20–32)
Calcium: 9.1 mg/dL (ref 8.6–10.4)
Chloride: 106 mmol/L (ref 98–110)
Creat: 0.72 mg/dL (ref 0.50–1.03)
Globulin: 2.5 g/dL (ref 1.9–3.7)
Glucose, Bld: 76 mg/dL (ref 65–139)
Potassium: 4.3 mmol/L (ref 3.5–5.3)
Sodium: 142 mmol/L (ref 135–146)
Total Bilirubin: 0.3 mg/dL (ref 0.2–1.2)
Total Protein: 6.7 g/dL (ref 6.1–8.1)
eGFR: 97 mL/min/{1.73_m2} (ref >=60)

## 2023-04-04 LAB — LIPID PANEL
Cholesterol: 107 mg/dL (ref <200)
HDL: 49 mg/dL — ABNORMAL LOW (ref >=50)
LDL Cholesterol (Calc): 32 mg/dL
Non-HDL Cholesterol (Calc): 58 mg/dL (ref <130)
Total CHOL/HDL Ratio: 2.2 (calc) (ref <5.0)
Triglycerides: 180 mg/dL — ABNORMAL HIGH (ref <150)

## 2023-04-06 LAB — CYTOLOGY - PAP
Comment: NEGATIVE
Diagnosis: NEGATIVE
High risk HPV: NEGATIVE

## 2023-05-01 ENCOUNTER — Other Ambulatory Visit: Payer: Self-pay | Admitting: Internal Medicine

## 2023-05-02 NOTE — Telephone Encounter (Signed)
Requested Prescriptions  Pending Prescriptions Disp Refills   buPROPion (WELLBUTRIN XL) 150 MG 24 hr tablet [Pharmacy Med Name: BUPROPION HCL XL 150 MG TABLET] 90 tablet 0    Sig: Take 1 tablet (150 mg total) by mouth daily.     Psychiatry: Antidepressants - bupropion Failed - 05/02/2023 11:46 AM      Failed - Valid encounter within last 6 months    Recent Outpatient Visits           4 weeks ago Encounter for general adult medical examination with abnormal findings   [place] [facility] [place], Salvadore [place], NP   7 months ago Type 2 diabetes mellitus with hyperglycemia, without long-term current use of insulin [facility])   [place] [facility] [place], [place], NP   1 year ago Encounter for general adult medical examination with abnormal findings   [facility] [facility] [place], Salvadore Oxford, NP   1 year ago Anxiety and depression   [place] [facility] [place], [place], NP   2 years ago Type 2 diabetes mellitus with hyperglycemia, without long-term current use of insulin [facility])   [place] [facility] [place], Salvadore Oxford, NP              Passed - Cr in normal range and within 360 days    Creat  Date Value Ref Range Status  04/03/2023 0.72 0.50 - 1.03 mg/dL Final   Creatinine, Urine  Date Value Ref Range Status  09/23/2022 122 20 - 275 mg/dL Final         Passed - AST in normal range and within 360 days    AST  Date Value Ref Range Status  04/03/2023 11 10 - 35 U/L Final         Passed - ALT in normal range and within 360 days    ALT  Date Value Ref Range Status  04/03/2023 10 6 - 29 U/L Final         Passed - Completed PHQ-2 or PHQ-9 in the last 360 days      Passed - Last BP in normal range    BP Readings from Last 1 Encounters:  04/03/23 118/82

## 2023-05-15 ENCOUNTER — Other Ambulatory Visit: Payer: Self-pay | Admitting: Internal Medicine

## 2023-05-16 NOTE — Telephone Encounter (Signed)
 Requested Prescriptions  Pending Prescriptions Disp Refills   glipiZIDE (GLUCOTROL) 10 MG tablet [Pharmacy Med Name: GLIPIZIDE 10 MG TABLET] 180 tablet 1    Sig: Take 1 tablet (10 mg total) by mouth 2 (two) times daily before a meal.     Endocrinology:  Diabetes - Sulfonylureas Failed - 05/16/2023  9:57 AM      Failed - Valid encounter within last 6 months    Recent Outpatient Visits           1 month ago Encounter for general adult medical examination with abnormal findings   [place] [facility] [place], Salvadore Oxford, NP   7 months ago Type 2 diabetes mellitus with hyperglycemia, without long-term current use of insulin [facility])   [place] [facility] [place], [place], NP   1 year ago Encounter for general adult medical examination with abnormal findings   [place] [facility] [place], Salvadore Oxford, NP   1 year ago Anxiety and depression   [place] [facility] [place], [place] W, NP   2 years ago Type 2 diabetes mellitus with hyperglycemia, without long-term current use of insulin [facility])   [place] [facility] [place], [place] W, NP              Passed - HBA1C is between 0 and 7.9 and within 180 days    Hemoglobin A1C  Date Value Ref Range Status  04/03/2023 5.6 4.0 - 5.6 % Final   Hgb A1c MFr Bld  Date Value Ref Range Status  03/25/2022 7.7 (H) <5.7 % of total Hgb Final    Comment:    For someone without known diabetes, a hemoglobin A1c value of 6.5% or greater indicates that they may have  diabetes and this should be confirmed with a follow-up  test. . For someone with known diabetes, a value <7% indicates  that their diabetes is well controlled and a value  greater than or equal to 7% indicates suboptimal  control. A1c targets should be individualized based on  duration of diabetes, age, comorbid conditions, and  other considerations. . Currently, no consensus exists regarding use of hemoglobin  A1c for diagnosis of diabetes for children. .          Passed - Cr in normal range and within 360 days    Creat  Date Value Ref Range Status  04/03/2023 0.72 0.50 - 1.03 mg/dL Final   Creatinine, Urine  Date Value Ref Range Status  09/23/2022 122 20 - 275 mg/dL Final

## 2023-06-06 ENCOUNTER — Ambulatory Visit
Admission: RE | Admit: 2023-06-06 | Discharge: 2023-06-06 | Disposition: A | Discharge status: Home / Self Care | Source: Ambulatory Visit | Attending: Internal Medicine | Admitting: Internal Medicine

## 2023-06-06 DIAGNOSIS — Z1231 Encounter for screening mammogram for malignant neoplasm of breast: Secondary | ICD-10-CM | POA: Diagnosis present

## 2023-06-30 ENCOUNTER — Other Ambulatory Visit: Payer: Self-pay | Admitting: Internal Medicine

## 2023-06-30 DIAGNOSIS — E1165 Type 2 diabetes mellitus with hyperglycemia: Secondary | ICD-10-CM

## 2023-06-30 NOTE — Telephone Encounter (Signed)
 Last OV 04/03/23.  Requested Prescriptions  Pending Prescriptions Disp Refills   pantoprazole  (PROTONIX ) 20 MG tablet [Pharmacy Med Name: PANTOPRAZOLE  SOD DR 20 MG TAB] 90 tablet 0    Sig: Take 1 tablet (20 mg total) by mouth daily.     Gastroenterology: Proton Pump Inhibitors Failed - 06/30/2023  3:41 PM      Failed - Valid encounter within last 12 months    Recent Outpatient Visits           11 years ago Low back pain   [facility], [place], MD               OZEMPIC , 0.25 OR 0.5 MG/DOSE, 2 MG/3ML SOPN [Pharmacy Med Name: OZEMPIC  0.25-0.5 MG/DOSE PEN] 9 mL 0    Sig: Inject 0.5 mg into the skin once a week. Start with 0.25mg  weekly x 4 weeks then increase to 0.5mg  weekly injection.     Endocrinology:  Diabetes - GLP-1 Receptor Agonists - semaglutide  Failed - 06/30/2023  3:41 PM      Failed - Valid encounter within last 6 months    Recent Outpatient Visits           11 years ago Low back pain   [facility], Montey Apa, MD              Passed - HBA1C in normal range and within 180 days    Hemoglobin A1C  Date Value Ref Range Status  04/03/2023 5.6 4.0 - 5.6 % Final   Hgb A1c MFr Bld  Date Value Ref Range Status  03/25/2022 7.7 (H) <5.7 % of total Hgb Final    Comment:    For someone without known diabetes, a hemoglobin A1c value of 6.5% or greater indicates that they may have  diabetes and this should be confirmed with a follow-up  test. . For someone with known diabetes, a value <7% indicates  that their diabetes is well controlled and a value  greater than or equal to 7% indicates suboptimal  control. A1c targets should be individualized based on  duration of diabetes, age, comorbid conditions, and  other considerations. . Currently, no consensus exists regarding use of hemoglobin A1c for diagnosis of diabetes for children. .          Passed - Cr in normal range and within 360 days    Creat  Date Value Ref Range  Status  04/03/2023 0.72 0.50 - 1.03 mg/dL Final   Creatinine, Urine  Date Value Ref Range Status  09/23/2022 122 20 - 275 mg/dL Final

## 2023-08-10 ENCOUNTER — Other Ambulatory Visit: Payer: Self-pay | Admitting: Internal Medicine

## 2023-08-11 NOTE — Telephone Encounter (Signed)
 Requested Prescriptions  Pending Prescriptions Disp Refills   atorvastatin  (LIPITOR) 40 MG tablet [Pharmacy Med Name: ATORVASTATIN  40 MG TABLET] 90 tablet 1    Sig: Take 1 tablet (40 mg total) by mouth daily.     Cardiovascular:  Antilipid - Statins Failed - 08/11/2023  5:15 PM      Failed - Valid encounter within last 12 months    Recent Outpatient Visits           11 years ago Low back pain   [facility], Montey Apa, MD              Failed - Lipid Panel in normal range within the last 12 months    Cholesterol  Date Value Ref Range Status  04/03/2023 107 <200 mg/dL Final   LDL Cholesterol (Calc)  Date Value Ref Range Status  04/03/2023 32 mg/dL (calc) Final    Comment:    Reference range: <100 . Desirable range <100 mg/dL for primary prevention;   <70 mg/dL for patients with CHD or diabetic patients  with > or = 2 CHD risk factors. Aaron Aas LDL-C is now calculated using the Martin-Hopkins  calculation, which is a validated novel method providing  better accuracy than the Friedewald equation in the  estimation of LDL-C.  Melinda Sprawls et al. Erroll Heard. 1610;960(45): 2061-2068  (http://education.[employer].com/faq/FAQ164)    Direct LDL  Date Value Ref Range Status  01/15/2019 150.0 mg/dL Final    Comment:    Optimal:  <100 mg/dLNear or Above Optimal:  100-129 mg/dLBorderline High:  130-159 mg/dLHigh:  160-189 mg/dLVery High:  >190 mg/dL   HDL  Date Value Ref Range Status  04/03/2023 49 (L) > OR = 50 mg/dL Final   Triglycerides  Date Value Ref Range Status  04/03/2023 180 (H) <150 mg/dL Final         Passed - Patient is not pregnant

## 2023-09-25 ENCOUNTER — Other Ambulatory Visit: Payer: Self-pay | Admitting: Internal Medicine

## 2023-09-25 DIAGNOSIS — E1165 Type 2 diabetes mellitus with hyperglycemia: Secondary | ICD-10-CM

## 2023-09-26 NOTE — Telephone Encounter (Signed)
 Requested medications are due for refill today.  yes  Requested medications are on the active medications list.  yes  Last refill. 06/30/2023 9 mL 0 rf  Future visit scheduled.   no  Notes to clinic.  Please review for refill.    Requested Prescriptions  Pending Prescriptions Disp Refills   OZEMPIC , 0.25 OR 0.5 MG/DOSE, 2 MG/3ML SOPN [Pharmacy Med Name: OZEMPIC  0.25-0.5 MG/DOSE PEN] 9 mL 0    Sig: Inject 0.5 mg into the skin once a week. Start with 0.25mg  weekly x 4 weeks then increase to 0.5mg  weekly injection.     Endocrinology:  Diabetes - GLP-1 Receptor Agonists - semaglutide  Failed - 09/26/2023  3:02 PM      Failed - Valid encounter within last 6 months    Recent Outpatient Visits           11 years ago Low back pain   [facility], Beula CROME, MD              Passed - HBA1C in normal range and within 180 days    Hemoglobin A1C  Date Value Ref Range Status  04/03/2023 5.6 4.0 - 5.6 % Final   Hgb A1c MFr Bld  Date Value Ref Range Status  03/25/2022 7.7 (H) <5.7 % of total Hgb Final    Comment:    For someone without known diabetes, a hemoglobin A1c value of 6.5% or greater indicates that they may have  diabetes and this should be confirmed with a follow-up  test. . For someone with known diabetes, a value <7% indicates  that their diabetes is well controlled and a value  greater than or equal to 7% indicates suboptimal  control. A1c targets should be individualized based on  duration of diabetes, age, comorbid conditions, and  other considerations. . Currently, no consensus exists regarding use of hemoglobin A1c for diagnosis of diabetes for children. .          Passed - Cr in normal range and within 360 days    Creat  Date Value Ref Range Status  04/03/2023 0.72 0.50 - 1.03 mg/dL Final   Creatinine, Urine  Date Value Ref Range Status  09/23/2022 122 20 - 275 mg/dL Final         Signed Prescriptions Disp Refills   pantoprazole   (PROTONIX ) 20 MG tablet 90 tablet 1    Sig: Take 1 tablet (20 mg total) by mouth daily.     Gastroenterology: Proton Pump Inhibitors Failed - 09/26/2023  3:02 PM      Failed - Valid encounter within last 12 months    Recent Outpatient Visits           11 years ago Low back pain   [facility], Beula CROME, MD

## 2023-09-26 NOTE — Telephone Encounter (Signed)
 Requested Prescriptions  Pending Prescriptions Disp Refills   OZEMPIC , 0.25 OR 0.5 MG/DOSE, 2 MG/3ML SOPN [Pharmacy Med Name: OZEMPIC  0.25-0.5 MG/DOSE PEN] 9 mL 0    Sig: Inject 0.5 mg into the skin once a week. Start with 0.25mg  weekly x 4 weeks then increase to 0.5mg  weekly injection.     Endocrinology:  Diabetes - GLP-1 Receptor Agonists - semaglutide  Failed - 09/26/2023  3:02 PM      Failed - Valid encounter within last 6 months    Recent Outpatient Visits           11 years ago Low back pain   [facility], Beula CROME, MD              Passed - HBA1C in normal range and within 180 days    Hemoglobin A1C  Date Value Ref Range Status  04/03/2023 5.6 4.0 - 5.6 % Final   Hgb A1c MFr Bld  Date Value Ref Range Status  03/25/2022 7.7 (H) <5.7 % of total Hgb Final    Comment:    For someone without known diabetes, a hemoglobin A1c value of 6.5% or greater indicates that they may have  diabetes and this should be confirmed with a follow-up  test. . For someone with known diabetes, a value <7% indicates  that their diabetes is well controlled and a value  greater than or equal to 7% indicates suboptimal  control. A1c targets should be individualized based on  duration of diabetes, age, comorbid conditions, and  other considerations. . Currently, no consensus exists regarding use of hemoglobin A1c for diagnosis of diabetes for children. .          Passed - Cr in normal range and within 360 days    Creat  Date Value Ref Range Status  04/03/2023 0.72 0.50 - 1.03 mg/dL Final   Creatinine, Urine  Date Value Ref Range Status  09/23/2022 122 20 - 275 mg/dL Final          pantoprazole  (PROTONIX ) 20 MG tablet [Pharmacy Med Name: PANTOPRAZOLE  SOD DR 20 MG TAB] 90 tablet 1    Sig: Take 1 tablet (20 mg total) by mouth daily.     Gastroenterology: Proton Pump Inhibitors Failed - 09/26/2023  3:02 PM      Failed - Valid encounter within last 12 months     Recent Outpatient Visits           11 years ago Low back pain   [facility], Beula CROME, MD

## 2023-10-25 ENCOUNTER — Other Ambulatory Visit: Payer: Self-pay | Admitting: Internal Medicine

## 2023-10-27 NOTE — Telephone Encounter (Signed)
 Courtesy refill. Requested Prescriptions  Pending Prescriptions Disp Refills   buPROPion  (WELLBUTRIN  XL) 150 MG 24 hr tablet [Pharmacy Med Name: BUPROPION  HCL XL 150 MG TABLET] 30 tablet 0    Sig: Take 1 tablet (150 mg total) by mouth daily.     Psychiatry: Antidepressants - bupropion  Failed - 10/27/2023  1:42 PM      Failed - Completed PHQ-2 or PHQ-9 in the last 360 days      Failed - Valid encounter within last 6 months    Recent Outpatient Visits           11 years ago Low back pain   [facility], Beula CROME, MD              Passed - Cr in normal range and within 360 days    Creat  Date Value Ref Range Status  04/03/2023 0.72 0.50 - 1.03 mg/dL Final   Creatinine, Urine  Date Value Ref Range Status  09/23/2022 122 20 - 275 mg/dL Final         Passed - AST in normal range and within 360 days    AST  Date Value Ref Range Status  04/03/2023 11 10 - 35 U/L Final         Passed - ALT in normal range and within 360 days    ALT  Date Value Ref Range Status  04/03/2023 10 6 - 29 U/L Final         Passed - Last BP in normal range    BP Readings from Last 1 Encounters:  04/03/23 118/82

## 2023-11-06 ENCOUNTER — Other Ambulatory Visit: Payer: Self-pay | Admitting: Internal Medicine

## 2023-11-07 NOTE — Telephone Encounter (Signed)
 OFFICE VISIT NEEDED FOR ADDITIONAL REFILLS   Requested Prescriptions  Pending Prescriptions Disp Refills   glipiZIDE  (GLUCOTROL ) 10 MG tablet [Pharmacy Med Name: GLIPIZIDE  10 MG TABLET] 180 tablet 0    Sig: Take 1 tablet (10 mg total) by mouth 2 (two) times daily before a meal.     Endocrinology:  Diabetes - Sulfonylureas Failed - 11/07/2023  1:03 PM      Failed - HBA1C is between 0 and 7.9 and within 180 days    Hemoglobin A1C  Date Value Ref Range Status  04/03/2023 5.6 4.0 - 5.6 % Final   Hgb A1c MFr Bld  Date Value Ref Range Status  03/25/2022 7.7 (H) <5.7 % of total Hgb Final    Comment:    For someone without known diabetes, a hemoglobin A1c value of 6.5% or greater indicates that they may have  diabetes and this should be confirmed with a follow-up  test. . For someone with known diabetes, a value <7% indicates  that their diabetes is well controlled and a value  greater than or equal to 7% indicates suboptimal  control. A1c targets should be individualized based on  duration of diabetes, age, comorbid conditions, and  other considerations. . Currently, no consensus exists regarding use of hemoglobin A1c for diagnosis of diabetes for children. .          Failed - Valid encounter within last 6 months    Recent Outpatient Visits           11 years ago Low back pain   [facility], Beula CROME, MD              Passed - Cr in normal range and within 360 days    Creat  Date Value Ref Range Status  04/03/2023 0.72 0.50 - 1.03 mg/dL Final   Creatinine, Urine  Date Value Ref Range Status  09/23/2022 122 20 - 275 mg/dL Final

## 2023-11-23 ENCOUNTER — Other Ambulatory Visit: Payer: Self-pay | Admitting: Internal Medicine

## 2023-11-23 NOTE — Telephone Encounter (Signed)
 Requested medications are due for refill today.  yes  Requested medications are on the active medications list.  yes  Last refill. 10/27/2023 #30 0 rf  Future visit scheduled.   no  Notes to clinic.  Pt is overdue for an ov. Pt already given a courtesy refill.    Requested Prescriptions  Pending Prescriptions Disp Refills   buPROPion  (WELLBUTRIN  XL) 150 MG 24 hr tablet [Pharmacy Med Name: BUPROPION  HCL XL 150 MG TABLET] 30 tablet 0    Sig: Take 1 tablet (150 mg total) by mouth daily.     Psychiatry: Antidepressants - bupropion  Failed - 11/23/2023  5:53 PM      Failed - Completed PHQ-2 or PHQ-9 in the last 360 days      Failed - Valid encounter within last 6 months    Recent Outpatient Visits           11 years ago Low back pain   [facility], Beula CROME, MD              Passed - Cr in normal range and within 360 days    Creat  Date Value Ref Range Status  04/03/2023 0.72 0.50 - 1.03 mg/dL Final   Creatinine, Urine  Date Value Ref Range Status  09/23/2022 122 20 - 275 mg/dL Final         Passed - AST in normal range and within 360 days    AST  Date Value Ref Range Status  04/03/2023 11 10 - 35 U/L Final         Passed - ALT in normal range and within 360 days    ALT  Date Value Ref Range Status  04/03/2023 10 6 - 29 U/L Final         Passed - Last BP in normal range    BP Readings from Last 1 Encounters:  04/03/23 118/82

## 2023-11-24 ENCOUNTER — Other Ambulatory Visit: Payer: Self-pay | Admitting: Internal Medicine

## 2023-11-24 NOTE — Telephone Encounter (Signed)
 Requested medications are due for refill today.  yes  Requested medications are on the active medications list.  yes  Last refill. 09/26/2023 9mL 0 rf  Future visit scheduled.   yes  Notes to clinic.  Please review sig.     Requested Prescriptions  Pending Prescriptions Disp Refills   OZEMPIC , 0.25 OR 0.5 MG/DOSE, 2 MG/3ML SOPN [Pharmacy Med Name: OZEMPIC  0.25-0.5 MG/DOSE PEN] 9 mL 0    Sig: Inject 0.5 mg into the skin once a week. Start with 0.25mg  weekly x 4 weeks then increase to 0.5mg  weekly injection.     Endocrinology:  Diabetes - GLP-1 Receptor Agonists - semaglutide  Failed - 11/24/2023  4:22 PM      Failed - HBA1C in normal range and within 180 days    Hemoglobin A1C  Date Value Ref Range Status  04/03/2023 5.6 4.0 - 5.6 % Final   Hgb A1c MFr Bld  Date Value Ref Range Status  03/25/2022 7.7 (H) <5.7 % of total Hgb Final    Comment:    For someone without known diabetes, a hemoglobin A1c value of 6.5% or greater indicates that they may have  diabetes and this should be confirmed with a follow-up  test. . For someone with known diabetes, a value <7% indicates  that their diabetes is well controlled and a value  greater than or equal to 7% indicates suboptimal  control. A1c targets should be individualized based on  duration of diabetes, age, comorbid conditions, and  other considerations. . Currently, no consensus exists regarding use of hemoglobin A1c for diagnosis of diabetes for children. .          Failed - Valid encounter within last 6 months    Recent Outpatient Visits           11 years ago Low back pain   [facility], Beula CROME, MD              Passed - Cr in normal range and within 360 days    Creat  Date Value Ref Range Status  04/03/2023 0.72 0.50 - 1.03 mg/dL Final   Creatinine, Urine  Date Value Ref Range Status  09/23/2022 122 20 - 275 mg/dL Final

## 2023-11-27 ENCOUNTER — Ambulatory Visit
Admission: RE | Admit: 2023-11-27 | Discharge: 2023-11-27 | Disposition: A | Discharge status: Home / Self Care | Source: Ambulatory Visit | Attending: Internal Medicine | Admitting: Internal Medicine

## 2023-11-27 ENCOUNTER — Encounter: Payer: Self-pay | Admitting: Internal Medicine

## 2023-11-27 ENCOUNTER — Ambulatory Visit (INDEPENDENT_AMBULATORY_CARE_PROVIDER_SITE_OTHER): Admitting: Internal Medicine

## 2023-11-27 VITALS — BP 122/78 | Ht 62.5 in | Wt 186.4 lb

## 2023-11-27 DIAGNOSIS — G8929 Other chronic pain: Secondary | ICD-10-CM | POA: Insufficient documentation

## 2023-11-27 DIAGNOSIS — R202 Paresthesia of skin: Secondary | ICD-10-CM

## 2023-11-27 DIAGNOSIS — E1165 Type 2 diabetes mellitus with hyperglycemia: Secondary | ICD-10-CM

## 2023-11-27 DIAGNOSIS — K219 Gastro-esophageal reflux disease without esophagitis: Secondary | ICD-10-CM

## 2023-11-27 DIAGNOSIS — Z23 Encounter for immunization: Secondary | ICD-10-CM | POA: Diagnosis not present

## 2023-11-27 DIAGNOSIS — F32A Depression, unspecified: Secondary | ICD-10-CM

## 2023-11-27 DIAGNOSIS — I10 Essential (primary) hypertension: Secondary | ICD-10-CM

## 2023-11-27 DIAGNOSIS — M542 Cervicalgia: Secondary | ICD-10-CM | POA: Diagnosis present

## 2023-11-27 DIAGNOSIS — M25511 Pain in right shoulder: Secondary | ICD-10-CM | POA: Diagnosis present

## 2023-11-27 DIAGNOSIS — E785 Hyperlipidemia, unspecified: Secondary | ICD-10-CM

## 2023-11-27 DIAGNOSIS — Z7985 Long-term (current) use of injectable non-insulin antidiabetic drugs: Secondary | ICD-10-CM

## 2023-11-27 DIAGNOSIS — Z7984 Long term (current) use of oral hypoglycemic drugs: Secondary | ICD-10-CM

## 2023-11-27 DIAGNOSIS — E6609 Other obesity due to excess calories: Secondary | ICD-10-CM

## 2023-11-27 DIAGNOSIS — E66811 Obesity, class 1: Secondary | ICD-10-CM | POA: Diagnosis not present

## 2023-11-27 DIAGNOSIS — L501 Idiopathic urticaria: Secondary | ICD-10-CM

## 2023-11-27 DIAGNOSIS — E1169 Type 2 diabetes mellitus with other specified complication: Secondary | ICD-10-CM

## 2023-11-27 DIAGNOSIS — G5603 Carpal tunnel syndrome, bilateral upper limbs: Secondary | ICD-10-CM

## 2023-11-27 DIAGNOSIS — G4733 Obstructive sleep apnea (adult) (pediatric): Secondary | ICD-10-CM

## 2023-11-27 MED ORDER — CYCLOBENZAPRINE HCL 5 MG PO TABS: 5.0000 mg | ORAL_TABLET | Freq: Three times a day (TID) | ORAL | 0 refills | Status: AC | PRN

## 2023-11-27 MED ORDER — GLIPIZIDE 10 MG PO TABS: 10.0000 mg | ORAL_TABLET | Freq: Two times a day (BID) | ORAL | 0 refills | Status: DC

## 2023-11-27 MED ORDER — IBUPROFEN 800 MG PO TABS: 800.0000 mg | ORAL_TABLET | Freq: Three times a day (TID) | ORAL | 0 refills | Status: AC | PRN

## 2023-11-27 MED ORDER — BUPROPION HCL ER (XL) 150 MG PO TB24: 150.0000 mg | ORAL_TABLET | Freq: Every day | ORAL | 1 refills | Status: AC

## 2023-11-27 NOTE — Assessment & Plan Note (Signed)
 Encourage weight loss as this can help reduce reflux symptoms Continue pantoprazole  20 mg daily- consider wean at next visit if asymptomatic

## 2023-11-27 NOTE — Assessment & Plan Note (Signed)
 C-Met and lipid profile today Encouraged her to consume a low-fat diet Continue atorvastatin  20 mg daily

## 2023-11-27 NOTE — Assessment & Plan Note (Signed)
 Encourage diet and exercise for weight loss Will try to increase ozempic  to 1 mg weekly

## 2023-11-27 NOTE — Assessment & Plan Note (Signed)
 A1C and urine microalbumin today Encouraged her to consume a low-carb diet and exercise for weight loss Continue glipizide  10 mg BID  Will give a sample of ozempic  1 mg weekly Encouraged diabetic eye exam Encouraged routine foot exam Flu shot today Pneumovax UTD, she declines prevnar 20 at this time Encouraged her to get her COVID booster

## 2023-11-27 NOTE — Progress Notes (Signed)
 Subjective:    Patient ID: Breanna Day, female    DOB: 1965-09-07, 58 y.o.   MRN: [id number]  HPI  Patient presents to clinic today for 68-month follow-up of chronic conditions.  HTN: Her BP today is 122/78.  She is not currently taking any antihypertensive medication but has been on lisinopril  in the past.  There is no ECG on file.  HLD: Her last LDL was 32, triglycerides 819, 03/2023.  She denies myalgias on atorvastatin .  She tries to consume a low-fat diet.  DM 2: Her last A1c was 5.6%, 03/2023.  She is taking glipizide  and ozempic  as prescribed.  She does not check her sugars.  She checks her feet routinely.  Her last eye exam was 04/2023, [facility].  Flu 03/2023.  Pneumovax 12/2020.  COVID x 2.  GERD: She is not sure what triggers this.  She denies breakthrough on pantoprazole .  There is no upper GI on file.  OSA: She averages 7 hours of sleep per night without the use of her CPAP.  Sleep study from 11/2020 reviewed.  Anxiety and depression: Chronic, managed on bupropion  and hydroxyzine  as needed.  She is no longer taking venlafaxine .  She is not currently seeing a therapist.  She denies SI/HI.  Idiopathic urticaria: Managed with hydroxyzine  as needed.  She follows with allergist.  She also reports right side neck and shoulder pain. This started 4 months ago. She describes the pain as sore and achy. The pain does not radiate in the right arm but she has numbness and tingling from the right elbow and down. The pain is worse with movement. It does not wake her up at night. She takes ibuprofen  and  with some relief of symptoms. She has seen a chiropractor for the same, pain initially improved but then returned. She reports history of a pinched nerve in her neck in her early 20's that resolved with an injection she believes.   Review of Systems  Past Medical History:  Diagnosis Date   Allergy    Chicken pox    Depression    Diabetes mellitus without complication (HCC)    Hypertension      Current Outpatient Medications  Medication Sig Dispense Refill   aspirin  EC 81 MG tablet Take 81 mg by mouth daily.     atorvastatin  (LIPITOR) 40 MG tablet Take 1 tablet (40 mg total) by mouth daily. 90 tablet 1   buPROPion  (WELLBUTRIN  XL) 150 MG 24 hr tablet Take 1 tablet (150 mg total) by mouth daily. 30 tablet 0   glipiZIDE  (GLUCOTROL ) 10 MG tablet Take 1 tablet (10 mg total) by mouth 2 (two) times daily before a meal. 180 tablet 0   hydrOXYzine  (VISTARIL ) 50 MG capsule Take 1 capsule (50 mg total) by mouth at bedtime. 90 capsule 2   OZEMPIC , 0.25 OR 0.5 MG/DOSE, 2 MG/3ML SOPN Inject 0.5 mg into the skin once a week. Start with 0.25mg  weekly x 4 weeks then increase to 0.5mg  weekly injection. 9 mL 0   pantoprazole  (PROTONIX ) 20 MG tablet Take 1 tablet (20 mg total) by mouth daily. 90 tablet 1   No current facility-administered medications for this visit.    Allergies  Allergen Reactions   Sulfa  Antibiotics Nausea Only   Elemental Sulfur Other (See Comments)    Muscle cramps     Family History  Problem Relation Age of Onset   Hypertension Mother    Arthritis Mother    Hyperlipidemia Mother  Atrial fibrillation Mother    Stroke Mother    Hypertension Father    Arthritis Father    Hyperlipidemia Father    Diabetes Maternal Grandmother    Heart disease Maternal Grandmother    Arthritis Maternal Grandmother    Hypertension Maternal Grandmother    Cancer Maternal Grandfather    Heart disease Maternal Grandfather    Arthritis Maternal Grandfather    Hypertension Maternal Grandfather    Diabetes Paternal Grandmother    Cancer Paternal Grandmother    Arthritis Paternal Grandmother    Hypertension Paternal Grandmother    Diabetes Paternal Grandfather    Heart disease Paternal Grandfather    Arthritis Paternal Grandfather    Hypertension Paternal Grandfather    Colon cancer Maternal Uncle    Breast cancer Paternal Aunt 59    Social History   Socioeconomic History    Marital status: Married    Spouse name: Not on file   Number of children: 2   Years of education: Not on file   Highest education level: Some college, no degree  Occupational History   Not on file  Tobacco Use   Smoking status: Never   Smokeless tobacco: Never  Substance and Sexual Activity   Alcohol use: No    Alcohol/week: 0.0 standard drinks of alcohol   Drug use: No   Sexual activity: Yes  Other Topics Concern   Not on file  Social History Narrative   Not on file   Social Drivers of Health   Financial Resource Strain: Not on file  Food Insecurity: Not on file  Transportation Needs: Not on file  Physical Activity: Not on file  Stress: Not on file  Social Connections: Not on file  Intimate Partner Violence: Not on file     Constitutional: Denies fever, malaise, fatigue, headache or abrupt weight changes.  HEENT: Denies eye pain, eye redness, ear pain, ringing in the ears, wax buildup, runny nose, nasal congestion, bloody nose, or sore throat. Respiratory: Denies difficulty breathing, shortness of breath, cough or sputum production.   Cardiovascular: Denies chest pain, chest tightness, palpitations or swelling in the hands or feet.  Gastrointestinal: Denies abdominal pain, bloating, constipation, diarrhea or blood in the stool.  GU: Denies urgency, frequency, pain with urination, burning sensation, blood in urine, odor or discharge. Musculoskeletal: Pt reports right side neck, right shoulder. Denies decrease in range of motion, difficulty with gait, muscle pain or joint swelling.  Skin: Patient reports intermittent hives, paresthesia of right upper extremity.  Denies redness, rashes, lesions or ulcercations.  Neurological: Denies dizziness, difficulty with memory, difficulty with speech or problems with balance and coordination.  Psych: Patient has a history of anxiety and depression.  Denies SI/HI.  No other specific complaints in a complete review of systems (except  as listed in HPI above).     Objective:   Physical Exam  BP 122/78 (BP Location: Left Arm, Patient Position: Sitting, Cuff Size: Normal)   Ht 5' 2.5 (1.588 m)   Wt 186 lb 6.4 oz (84.6 kg)   LMP 02/01/2016 Comment: irregular  BMI 33.55 kg/m    Wt Readings from Last 3 Encounters:  04/03/23 188 lb 3.2 oz (85.4 kg)  09/23/22 183 lb (83 kg)  03/25/22 176 lb (79.8 kg)    General: Appears her stated age, obese, in NAD. Skin: Warm, dry and intact. No ulcerations noted. HEENT: Head: normal shape and size; Eyes: sclera white, no icterus, conjunctiva pink, PERRLA and EOMs intact;  Cardiovascular: Normal rate and  rhythm. S1,S2 noted.  No murmur, rubs or gallops noted. No JVD or BLE edema. No carotid bruits noted. Pulmonary/Chest: Normal effort and positive vesicular breath sounds. No respiratory distress. No wheezes, rales or ronchi noted.  Abdomen: Soft and nontender. Normal bowel sounds.  Musculoskeletal: Normal flexion, extension and rotation of the cervical spine. No pain with palpation over the lumbar spine but pain with palpation of the right paracervical muscles. Normal interna and external rotation of the right shoulder. Pain with palpation over the right AC joint, scapula. Shoulder shrug unequal, L>R. Positive drop can test on the left. Strength 5/5 BUE/BLE. Hand grips equal. No difficulty with gait.  Neurological: Alert and oriented. Positive phalen and tinel bilaterally. Coordination normal.  Psychiatric: Mood and affect normal. Behavior is normal. Judgment and thought content normal.   BMET    Component Value Date/Time   NA 142 04/03/2023 1333   K 4.3 04/03/2023 1333   CL 106 04/03/2023 1333   CO2 28 04/03/2023 1333   GLUCOSE 76 04/03/2023 1333   BUN 16 04/03/2023 1333   CREATININE 0.72 04/03/2023 1333   CALCIUM  9.1 04/03/2023 1333   GFRNONAA >60 05/15/2019 0410   GFRAA >60 05/15/2019 0410    Lipid Panel     Component Value Date/Time   CHOL 107 04/03/2023 1333   TRIG  180 (H) 04/03/2023 1333   HDL 49 (L) 04/03/2023 1333   CHOLHDL 2.2 04/03/2023 1333   VLDL 27.4 05/29/2019 1627   LDLCALC 32 04/03/2023 1333    CBC    Component Value Date/Time   WBC 10.8 04/03/2023 1333   RBC 4.50 04/03/2023 1333   HGB 13.4 04/03/2023 1333   HCT 40.1 04/03/2023 1333   PLT 366 04/03/2023 1333   MCV 89.1 04/03/2023 1333   MCH 29.8 04/03/2023 1333   MCHC 33.4 04/03/2023 1333   RDW 12.1 04/03/2023 1333   LYMPHSABS 4,079 (H) 07/19/2021 0813   EOSABS 937 (H) 07/19/2021 0813   BASOSABS 145 07/19/2021 0813    Hgb A1C Lab Results  Component Value Date   HGBA1C 5.6 04/03/2023           Assessment & Plan:   Chronic neck and right shoulder pain:  DDx include muscle strain, cervical radiculitis, OA of the shoulder, rotator/labral tear of the right shoulder Xray cervical spine today Xray right shoulder today RX for ibuprofen  800 mg to take as needed for pain and inflammation- consume with food RX for cyclobenzaprine  5 mg daily prn for muscle tension/pain- sedation caution given Encouraged heat and massage Consider referral to PT vs orthopedics pending xray results  Carpal tunnel, bilateral:  She has had injections but has not had surgery Encouraged use of splints at night  Consider referral to hand surgery if symptoms persist or worsen  RTC in 6 months, for your annual exam Angeline Laura, NP

## 2023-11-27 NOTE — Assessment & Plan Note (Signed)
 Stable on bupropion  150 mg and hydroxyzine  50 mg daily Support offered

## 2023-11-27 NOTE — Assessment & Plan Note (Signed)
Encourage weight loss as this can help reduce sleep apnea symptoms She does not wear her CPAP machine

## 2023-11-27 NOTE — Assessment & Plan Note (Signed)
Controlled off meds °Reinforced DASH diet and exercise for weight loss °Will monitor °

## 2023-11-27 NOTE — Assessment & Plan Note (Signed)
 Continue hydroxyzine  50 mg QHS

## 2023-11-27 NOTE — Patient Instructions (Signed)

## 2023-11-28 ENCOUNTER — Ambulatory Visit: Payer: Self-pay | Admitting: Internal Medicine

## 2023-11-28 DIAGNOSIS — G8929 Other chronic pain: Secondary | ICD-10-CM

## 2023-11-28 DIAGNOSIS — R202 Paresthesia of skin: Secondary | ICD-10-CM

## 2023-11-28 DIAGNOSIS — M542 Cervicalgia: Secondary | ICD-10-CM

## 2023-11-28 LAB — LIPID PANEL
Cholesterol: 115 mg/dL (ref <200)
HDL: 55 mg/dL (ref >=50)
LDL Cholesterol (Calc): 37 mg/dL
Non-HDL Cholesterol (Calc): 60 mg/dL (ref <130)
Total CHOL/HDL Ratio: 2.1 (calc) (ref <5.0)
Triglycerides: 147 mg/dL (ref <150)

## 2023-11-28 LAB — COMPREHENSIVE METABOLIC PANEL WITH GFR
AG Ratio: 1.8 (calc) (ref 1.0–2.5)
ALT: 16 U/L (ref 6–29)
AST: 18 U/L (ref 10–35)
Albumin: 4.4 g/dL (ref 3.6–5.1)
Alkaline phosphatase (APISO): 71 U/L (ref 37–153)
BUN: 14 mg/dL (ref 7–25)
CO2: 27 mmol/L (ref 20–32)
Calcium: 9.1 mg/dL (ref 8.6–10.4)
Chloride: 105 mmol/L (ref 98–110)
Creat: 0.65 mg/dL (ref 0.50–1.03)
Globulin: 2.5 g/dL (ref 1.9–3.7)
Glucose, Bld: 71 mg/dL (ref 65–99)
Potassium: 4.1 mmol/L (ref 3.5–5.3)
Sodium: 139 mmol/L (ref 135–146)
Total Bilirubin: 0.4 mg/dL (ref 0.2–1.2)
Total Protein: 6.9 g/dL (ref 6.1–8.1)
eGFR: 102 mL/min/1.73m2 (ref >=60)

## 2023-11-28 LAB — MICROALBUMIN / CREATININE URINE RATIO
Creatinine, Urine: 37 mg/dL (ref 20–275)
Microalb Creat Ratio: 5 mg/g{creat} (ref <30)
Microalb, Ur: 0.2 mg/dL

## 2023-11-28 LAB — CBC
HCT: 40.1 % (ref 35.0–45.0)
Hemoglobin: 13.3 g/dL (ref 11.7–15.5)
MCH: 30 pg (ref 27.0–33.0)
MCHC: 33.2 g/dL (ref 32.0–36.0)
MCV: 90.5 fL (ref 80.0–100.0)
MPV: 9.9 fL (ref 7.5–12.5)
Platelets: 381 Thousand/uL (ref 140–400)
RBC: 4.43 Million/uL (ref 3.80–5.10)
RDW: 12.4 % (ref 11.0–15.0)
WBC: 10.8 Thousand/uL (ref 3.8–10.8)

## 2023-11-28 LAB — HEMOGLOBIN A1C
Hgb A1c MFr Bld: 5.7 % — ABNORMAL HIGH (ref <5.7)
Mean Plasma Glucose: 117 mg/dL
eAG (mmol/L): 6.5 mmol/L

## 2023-12-29 ENCOUNTER — Encounter: Payer: Self-pay | Admitting: Internal Medicine

## 2023-12-29 MED ORDER — SEMAGLUTIDE (1 MG/DOSE) 4 MG/3ML ~~LOC~~ SOPN: 1.0000 mg | PEN_INJECTOR | SUBCUTANEOUS | 1 refills | Status: AC

## 2024-01-08 ENCOUNTER — Other Ambulatory Visit: Payer: Self-pay | Admitting: Internal Medicine

## 2024-01-08 DIAGNOSIS — E1165 Type 2 diabetes mellitus with hyperglycemia: Secondary | ICD-10-CM

## 2024-01-10 NOTE — Telephone Encounter (Signed)
 Requested Prescriptions  Pending Prescriptions Disp Refills   pantoprazole  (PROTONIX ) 20 MG tablet [Pharmacy Med Name: PANTOPRAZOLE  SOD DR 20 MG TAB] 90 tablet 0    Sig: Take 1 tablet (20 mg total) by mouth daily.     Gastroenterology: Proton Pump Inhibitors Passed - 01/10/2024  9:29 AM      Passed - Valid encounter within last 12 months    Recent Outpatient Visits           1 month ago Type 2 diabetes mellitus with hyperglycemia, without long-term current use of insulin  [facility])   [place] [facility], [place] W, NP   12 years ago Low back pain   [facility], Beula CROME, MD               hydrOXYzine  (VISTARIL ) 50 MG capsule [Pharmacy Med Name: HYDROXYZINE  PAM 50 MG CAP] 90 capsule 0    Sig: Take 1 capsule (50 mg total) by mouth at bedtime.     Ear, Nose, and Throat:  Antihistamines 2 Passed - 01/10/2024  9:29 AM      Passed - Cr in normal range and within 360 days    Creat  Date Value Ref Range Status  11/27/2023 0.65 0.50 - 1.03 mg/dL Final   Creatinine, Urine  Date Value Ref Range Status  11/27/2023 37 20 - 275 mg/dL Final         Passed - Valid encounter within last 12 months    Recent Outpatient Visits           1 month ago Type 2 diabetes mellitus with hyperglycemia, without long-term current use of insulin  [facility])   [place] [facility] [place], [place] W, NP   12 years ago Low back pain   [facility], Beula CROME, MD

## 2024-02-23 ENCOUNTER — Other Ambulatory Visit: Payer: Self-pay | Admitting: Internal Medicine

## 2024-02-26 NOTE — Telephone Encounter (Signed)
 Requested Prescriptions  Pending Prescriptions Disp Refills   glipiZIDE  (GLUCOTROL ) 10 MG tablet [Pharmacy Med Name: GLIPIZIDE  10 MG TABLET] 180 tablet 0    Sig: Take 1 tablet (10 mg total) by mouth 2 (two) times daily before a meal.     Endocrinology:  Diabetes - Sulfonylureas Passed - 02/26/2024  3:53 PM      Passed - HBA1C is between 0 and 7.9 and within 180 days    Hgb A1c MFr Bld  Date Value Ref Range Status  11/27/2023 5.7 (H) <5.7 % Final    Comment:    For someone without known diabetes, a hemoglobin  A1c value between 5.7% and 6.4% is consistent with prediabetes and should be confirmed with a  follow-up test. . For someone with known diabetes, a value <7% indicates that their diabetes is well controlled. A1c targets should be individualized based on duration of diabetes, age, comorbid conditions, and other considerations. . This assay result is consistent with an increased risk of diabetes. . Currently, no consensus exists regarding use of hemoglobin A1c for diagnosis of diabetes for children. .          Passed - Cr in normal range and within 360 days    Creat  Date Value Ref Range Status  11/27/2023 0.65 0.50 - 1.03 mg/dL Final   Creatinine, Urine  Date Value Ref Range Status  11/27/2023 37 20 - 275 mg/dL Final         Passed - Valid encounter within last 6 months    Recent Outpatient Visits           3 months ago Type 2 diabetes mellitus with hyperglycemia, without long-term current use of insulin  [facility])   [place] [facility] [place], [place] W, NP   12 years ago Low back pain   [facility], Beula CROME, MD

## 2024-03-05 ENCOUNTER — Other Ambulatory Visit: Payer: Self-pay | Admitting: Internal Medicine

## 2024-03-06 NOTE — Telephone Encounter (Signed)
 Requested Prescriptions  Pending Prescriptions Disp Refills   hydrOXYzine  (VISTARIL ) 50 MG capsule [Pharmacy Med Name: HYDROXYZINE  PAM 50 MG CAP] 90 capsule 0    Sig: Take 1 capsule (50 mg total) by mouth at bedtime.     Ear, Nose, and Throat:  Antihistamines 2 Passed - 03/06/2024  3:08 PM      Passed - Cr in normal range and within 360 days    Creat  Date Value Ref Range Status  11/27/2023 0.65 0.50 - 1.03 mg/dL Final   Creatinine, Urine  Date Value Ref Range Status  11/27/2023 37 20 - 275 mg/dL Final         Passed - Valid encounter within last 12 months    Recent Outpatient Visits           3 months ago Type 2 diabetes mellitus with hyperglycemia, without long-term current use of insulin  [facility])   [place] [facility] [place], [place] W, NP   12 years ago Low back pain   [facility], Beula CROME, MD

## 2024-03-11 ENCOUNTER — Encounter: Payer: Self-pay | Admitting: Internal Medicine

## 2024-03-11 MED ORDER — HYDROXYZINE PAMOATE 50 MG PO CAPS: 100.0000 mg | ORAL_CAPSULE | Freq: Every day | ORAL | 0 refills | Status: AC

## 2024-04-04 ENCOUNTER — Other Ambulatory Visit: Payer: Self-pay | Admitting: Internal Medicine

## 2024-04-04 NOTE — Telephone Encounter (Signed)
 Requested Prescriptions  Pending Prescriptions Disp Refills   atorvastatin  (LIPITOR) 40 MG tablet [Pharmacy Med Name: ATORVASTATIN  40 MG TABLET] 90 tablet 0    Sig: Take 1 tablet (40 mg total) by mouth daily.     Cardiovascular:  Antilipid - Statins Failed - 04/04/2024  3:28 PM      Failed - Lipid Panel in normal range within the last 12 months    Cholesterol  Date Value Ref Range Status  11/27/2023 115 <200 mg/dL Final   LDL Cholesterol (Calc)  Date Value Ref Range Status  11/27/2023 37 mg/dL (calc) Final    Comment:    Reference range: <100 . Desirable range <100 mg/dL for primary prevention;   <70 mg/dL for patients with CHD or diabetic patients  with > or = 2 CHD risk factors. SABRA LDL-C is now calculated using the Martin-Hopkins  calculation, which is a validated novel method providing  better accuracy than the Friedewald equation in the  estimation of LDL-C.  Gladis APPLETHWAITE et al. SANDREA. 7986;689(80): 2061-2068  (http://education.[employer].com/faq/FAQ164)    Direct LDL  Date Value Ref Range Status  01/15/2019 150.0 mg/dL Final    Comment:    Optimal:  <100 mg/dLNear or Above Optimal:  100-129 mg/dLBorderline High:  130-159 mg/dLHigh:  160-189 mg/dLVery High:  >190 mg/dL   HDL  Date Value Ref Range Status  11/27/2023 55 > OR = 50 mg/dL Final   Triglycerides  Date Value Ref Range Status  11/27/2023 147 <150 mg/dL Final         Passed - Patient is not pregnant      Passed - Valid encounter within last 12 months    Recent Outpatient Visits           4 months ago Type 2 diabetes mellitus with hyperglycemia, without long-term current use of insulin  [facility])   [place] [facility] [place], Angeline ORN, NP   12 years ago Low back pain   [facility], Beula CROME, MD

## 2024-04-05 ENCOUNTER — Encounter: Admitting: Internal Medicine

## 2024-04-05 DIAGNOSIS — F331 Major depressive disorder, recurrent, moderate: Secondary | ICD-10-CM | POA: Insufficient documentation

## 2024-04-05 NOTE — Progress Notes (Unsigned)
 Patient  Subjective:    Patient ID: Breanna Day, female    DOB: Apr 01, 1965, 59 y.o.   MRN: [id number]  HPI  Patient presents to clinic today for her annual exam.  Flu: 11/2023 Tetanus: unsure COVID: X 2 Pneumovax: 12/2020 Shingrix: Never Pap Smear: 03/2023 Mammogram: 05/2023 Colon screening: never Vision screening: annually Dentist: biannually  Diet: She does eat some meat. She consumes fruits and veggies. She tries to avoid fried foods. She drinks mostly water. Exercise: None  Review of Systems     Past Medical History:  Diagnosis Date   Allergy    Chicken pox    Depression    Diabetes mellitus without complication (HCC)    Hypertension     Current Outpatient Medications  Medication Sig Dispense Refill   atorvastatin  (LIPITOR) 40 MG tablet Take 1 tablet (40 mg total) by mouth daily. 90 tablet 1   buPROPion  (WELLBUTRIN  XL) 150 MG 24 hr tablet Take 1 tablet (150 mg total) by mouth daily. 90 tablet 1   Cyanocobalamin (B-12 PO) Take by mouth.     cyclobenzaprine  (FLEXERIL ) 5 MG tablet Take 1 tablet (5 mg total) by mouth 3 (three) times daily as needed for muscle spasms. 30 tablet 0   glipiZIDE  (GLUCOTROL ) 10 MG tablet Take 1 tablet (10 mg total) by mouth 2 (two) times daily before a meal. 180 tablet 0   hydrOXYzine  (VISTARIL ) 50 MG capsule Take 2 capsules (100 mg total) by mouth at bedtime. 180 capsule 0   ibuprofen  (ADVIL ) 800 MG tablet Take 1 tablet (800 mg total) by mouth every 8 (eight) hours as needed. 30 tablet 0   Multiple Vitamins-Minerals (HAIR SKIN & NAILS PO) Take by mouth.     pantoprazole  (PROTONIX ) 20 MG tablet Take 1 tablet (20 mg total) by mouth daily. 90 tablet 0   Semaglutide , 1 MG/DOSE, 4 MG/3ML SOPN Inject 1 mg as directed once a week. 9 mL 1   No current facility-administered medications for this visit.    Allergies  Allergen Reactions   Sulfa  Antibiotics Nausea Only   Elemental Sulfur Other (See Comments)    Muscle cramps     Family History   Problem Relation Age of Onset   Hypertension Mother    Arthritis Mother    Hyperlipidemia Mother    Atrial fibrillation Mother    Stroke Mother    Hypertension Father    Arthritis Father    Hyperlipidemia Father    Diabetes Maternal Grandmother    Heart disease Maternal Grandmother    Arthritis Maternal Grandmother    Hypertension Maternal Grandmother    Cancer Maternal Grandfather    Heart disease Maternal Grandfather    Arthritis Maternal Grandfather    Hypertension Maternal Grandfather    Diabetes Paternal Grandmother    Cancer Paternal Grandmother    Arthritis Paternal Grandmother    Hypertension Paternal Grandmother    Diabetes Paternal Grandfather    Heart disease Paternal Grandfather    Arthritis Paternal Grandfather    Hypertension Paternal Grandfather    Colon cancer Maternal Uncle    Breast cancer Paternal Aunt 29    Social History   Socioeconomic History   Marital status: Married    Spouse name: Not on file   Number of children: 2   Years of education: Not on file   Highest education level: Some college, no degree  Occupational History   Not on file  Tobacco Use   Smoking status: Never   Smokeless tobacco: Never  Substance and Sexual Activity   Alcohol use: No    Alcohol/week: 0.0 standard drinks of alcohol   Drug use: No   Sexual activity: Yes  Other Topics Concern   Not on file  Social History Narrative   Not on file   Social Drivers of Health   Tobacco Use: Low Risk (11/27/2023)   Patient History    Smoking Tobacco Use: Never    Smokeless Tobacco Use: Never    Passive Exposure: Not on file  Financial Resource Strain: Not on file  Food Insecurity: Not on file  Transportation Needs: Not on file  Physical Activity: Not on file  Stress: Not on file  Social Connections: Not on file  Intimate Partner Violence: Not on file  Depression (PHQ2-9): Low Risk (11/27/2023)   Depression (PHQ2-9)    PHQ-2 Score: 3  Alcohol Screen: Low Risk  (09/23/2022)   Alcohol Screen    Last Alcohol Screening Score (AUDIT): 2  Housing: Not on file  Utilities: Not on file  Health Literacy: Not on file     Constitutional: Denies fever, malaise, fatigue, headache or abrupt weight changes.  HEENT: Denies eye pain, eye redness, ear pain, ringing in the ears, wax buildup, runny nose, nasal congestion, bloody nose, or sore throat. Respiratory: Denies difficulty breathing, shortness of breath, cough or sputum production.   Cardiovascular: Denies chest pain, chest tightness, palpitations or swelling in the hands or feet.  Gastrointestinal: Denies abdominal pain, bloating, constipation, diarrhea or blood in the stool.  GU: Denies urgency, frequency, pain with urination, burning sensation, blood in urine, odor or discharge. Musculoskeletal: Denies decrease in range of motion, difficulty with gait, muscle pain or joint pain and swelling.  Skin: Denies redness, rashes, lesions or ulcercations.  Neurological: Patient reports hot flashes.  Denies dizziness, difficulty with memory, difficulty with speech or problems with balance and coordination.  Psych: Patient has a history of anxiety and depression.  Denies SI/HI.  No other specific complaints in a complete review of systems (except as listed in HPI above).  Objective:   Physical Exam   LMP 02/01/2016 Comment: irregular   Wt Readings from Last 3 Encounters:  11/27/23 186 lb 6.4 oz (84.6 kg)  04/03/23 188 lb 3.2 oz (85.4 kg)  09/23/22 183 lb (83 kg)    General: Appears her stated age, obese, in NAD. Skin: Warm, dry and intact. No ulcerations noted. HEENT: Head: normal shape and size; Eyes: sclera white, no icterus, conjunctiva pink, PERRLA and EOMs intact;  Neck:  Neck supple, trachea midline. No masses, lumps or thyromegaly present.  Cardiovascular: Normal rate and rhythm. S1,S2 noted.  No murmur, rubs or gallops noted. No JVD or BLE edema. No carotid bruits noted. Pulmonary/Chest: Normal  effort and positive vesicular breath sounds. No respiratory distress. No wheezes, rales or ronchi noted.  Abdomen: Normal bowel sounds.  Pelvic: Normal female anatomy.  Cervix not well-visualized.  Friable vaginal wall tissue.  No CMT.  Adnexa nonpalpable. Musculoskeletal: Strength 5/5 BUE/BLE. No difficulty with gait.  Neurological: Alert and oriented. Cranial nerves II-XII grossly intact. Coordination normal.  Psychiatric: Mood and affect normal. Behavior is normal. Judgment and thought content normal.    BMET    Component Value Date/Time   NA 139 11/27/2023 1424   K 4.1 11/27/2023 1424   CL 105 11/27/2023 1424   CO2 27 11/27/2023 1424   GLUCOSE 71 11/27/2023 1424   BUN 14 11/27/2023 1424   CREATININE 0.65 11/27/2023 1424   CALCIUM  9.1 11/27/2023 1424  GFRNONAA >60 05/15/2019 0410   GFRAA >60 05/15/2019 0410    Lipid Panel     Component Value Date/Time   CHOL 115 11/27/2023 1424   TRIG 147 11/27/2023 1424   HDL 55 11/27/2023 1424   CHOLHDL 2.1 11/27/2023 1424   VLDL 27.4 05/29/2019 1627   LDLCALC 37 11/27/2023 1424    CBC    Component Value Date/Time   WBC 10.8 11/27/2023 1424   RBC 4.43 11/27/2023 1424   HGB 13.3 11/27/2023 1424   HCT 40.1 11/27/2023 1424   PLT 381 11/27/2023 1424   MCV 90.5 11/27/2023 1424   MCH 30.0 11/27/2023 1424   MCHC 33.2 11/27/2023 1424   RDW 12.4 11/27/2023 1424   LYMPHSABS 4,079 (H) 07/19/2021 0813   EOSABS 937 (H) 07/19/2021 0813   BASOSABS 145 07/19/2021 0813    Hgb A1C Lab Results  Component Value Date   HGBA1C 5.7 (H) 11/27/2023           Assessment & Plan:   Preventative Health Maintenance:  Flu shot UTD She declines tetanus booster today Encouraged her to get her COVID booster Pneumovax UTD Discussed Shingrix vaccine, she will check coverage with her insurance company and schedule a nurse visit if she would like to have this done Pap smear UTD Mammogram ordered-she will call to schedule Cologuard  ordered Encouraged her to consume a balanced diet and exercise regimen Advised her seeing eye doctor and dentist annually We will check CBC, c-Met, lipid and A1c today  RTC in 6 months, follow-up chronic conditions Angeline Laura, NP
